# Patient Record
Sex: Male | Born: 1935 | Race: White | Hispanic: No | Smoking: Former smoker
Health system: Southern US, Community
[De-identification: ages and names within clinical notes are randomized; demographics above are authoritative.]

## PROBLEM LIST (undated history)

## (undated) DIAGNOSIS — M1000 Idiopathic gout, unspecified site: Secondary | ICD-10-CM

## (undated) DIAGNOSIS — E785 Hyperlipidemia, unspecified: Secondary | ICD-10-CM

## (undated) DIAGNOSIS — I739 Peripheral vascular disease, unspecified: Secondary | ICD-10-CM

## (undated) DIAGNOSIS — H9190 Unspecified hearing loss, unspecified ear: Secondary | ICD-10-CM

## (undated) DIAGNOSIS — E1149 Type 2 diabetes mellitus with other diabetic neurological complication: Secondary | ICD-10-CM

## (undated) DIAGNOSIS — N401 Enlarged prostate with lower urinary tract symptoms: Secondary | ICD-10-CM

## (undated) DIAGNOSIS — IMO0001 Reserved for inherently not codable concepts without codable children: Secondary | ICD-10-CM

## (undated) DIAGNOSIS — I10 Essential (primary) hypertension: Secondary | ICD-10-CM

## (undated) DIAGNOSIS — N138 Other obstructive and reflux uropathy: Secondary | ICD-10-CM

## (undated) HISTORY — DX: Hyperlipidemia, unspecified: E78.5

## (undated) HISTORY — DX: Peripheral vascular disease, unspecified: I73.9

## (undated) HISTORY — PX: PACEMAKER INSERTION: SHX728

## (undated) HISTORY — DX: Unspecified hearing loss, unspecified ear: H91.90

## (undated) HISTORY — DX: Type 2 diabetes mellitus with other diabetic neurological complication: E11.49

## (undated) HISTORY — DX: Essential (primary) hypertension: I10

## (undated) HISTORY — DX: Benign prostatic hyperplasia with lower urinary tract symptoms: N40.1

## (undated) HISTORY — DX: Benign prostatic hyperplasia with lower urinary tract symptoms: N13.8

## (undated) HISTORY — PX: HERNIA REPAIR: SHX51

## (undated) HISTORY — DX: Idiopathic gout, unspecified site: M10.00

## (undated) HISTORY — DX: Reserved for inherently not codable concepts without codable children: IMO0001

---

## 2009-06-02 ENCOUNTER — Inpatient Hospital Stay: Payer: Self-pay | Admitting: General Surgery

## 2009-06-14 ENCOUNTER — Ambulatory Visit: Payer: Self-pay | Admitting: Ophthalmology

## 2009-06-27 ENCOUNTER — Ambulatory Visit: Payer: Self-pay | Admitting: Ophthalmology

## 2010-04-05 ENCOUNTER — Emergency Department: Payer: Self-pay | Admitting: Emergency Medicine

## 2010-08-01 ENCOUNTER — Ambulatory Visit: Payer: Self-pay | Admitting: Ophthalmology

## 2010-08-14 ENCOUNTER — Ambulatory Visit: Payer: Self-pay | Admitting: Ophthalmology

## 2011-01-11 ENCOUNTER — Inpatient Hospital Stay: Payer: Self-pay | Admitting: Internal Medicine

## 2011-05-21 ENCOUNTER — Encounter: Payer: Self-pay | Admitting: Nurse Practitioner

## 2011-05-21 ENCOUNTER — Encounter: Payer: Self-pay | Admitting: Cardiothoracic Surgery

## 2013-04-01 ENCOUNTER — Ambulatory Visit: Payer: Self-pay | Admitting: Internal Medicine

## 2013-04-08 ENCOUNTER — Ambulatory Visit: Payer: Self-pay | Admitting: Internal Medicine

## 2014-12-31 NOTE — Op Note (Signed)
PATIENT NAME:  Jeffery Horne, Jeffery Horne MR#:  [id number] DATE OF BIRTH:  May 09, 1927  DATE OF PROCEDURE:  04/08/2013  ATTENDING PHYSICIAN: Dr. 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   PROCEDURE:  Insertion of a permanent pacemaker.   ACCESSORY CLINICAL DATA: Pacemaker model Adapta [id number], serial # [id number]. Right ventricular lead model #5092, serial #[id number], 58 cm. Atrial lead #5592, 53 cm, serial #[id number]. Stimulation threshold 0.7 mA on the ventricular side and 1.9 mA on the atrial side.  , 580 ohms on the ventricular side. 406 ohms on the atrial side. R-wave 11.0, P-wave 4.8.  PROCEDURE: The patient was taken to the operating room and the left upper chest was prepared with Hibiclens. It was draped in a sterile fashion. Next, 1% Xylocaine was infiltrated into the skin and subcutaneous tissue. Then an incision was made on the skin overlying the deltopectoral groove. The incision was carried down to the fascia and then subcutaneous tissues were dissected bluntly and cephalic vein was isolated. A cutdown was made in the cephalic vein and the ventricular lead was introduced through the cutdown. It was manipulated to the superior vena cava into the right atrium and then an optimum position was obtained in the floor of the right ventricle where stimulation thresholds were found to be satisfactory. The lead was tied to the cephalic vein and then on the top of the suture sleeve.  Stimulation thresholds were checked twice to make sure the lead is securely positioned. The patient had a run of V. tach during the procedure which resolved after pulling the lead back. The atrial lead was introduced from the left subclavian stick and the left subclavian vein was entered in a Trendelenburg position in the first  pass and then through the introducer kit  atrial  lead  was introduced  into the left subclavian vein and a pacemaker wire was put in the right atrium and then an optimum position was obtained in the right atrial appendage. The lead was tied to the pectoralis major muscle on top of the suture sleeves and the ventricular lead was also tied to the pectoralis major muscle on  the top of the suture sleeve and both leads were connected to the pacemaker and making sure the atrial lead goes into the atrial channel and the ventricular lead goes into the ventricular channels. All connections were tightened securely according to company specification. Subcutaneous tissues and skin were sutured separately. The patient tolerated the procedure well and he was sent to the recovery room in stable condition.    ____________________________ Cletis Athens, MD jm:dp D: 04/08/2013 15:38:54 ET T: 04/08/2013 16:38:08 ET JOB#: [id number]  cc:    Cletis Athens MD ELECTRONICALLY SIGNED 04/09/2013 18:09

## 2015-05-09 DIAGNOSIS — E1149 Type 2 diabetes mellitus with other diabetic neurological complication: Secondary | ICD-10-CM | POA: Insufficient documentation

## 2015-05-09 DIAGNOSIS — I152 Hypertension secondary to endocrine disorders: Secondary | ICD-10-CM | POA: Insufficient documentation

## 2015-05-09 DIAGNOSIS — E1169 Type 2 diabetes mellitus with other specified complication: Secondary | ICD-10-CM | POA: Insufficient documentation

## 2015-05-09 DIAGNOSIS — I10 Essential (primary) hypertension: Secondary | ICD-10-CM | POA: Insufficient documentation

## 2015-05-09 DIAGNOSIS — E785 Hyperlipidemia, unspecified: Secondary | ICD-10-CM | POA: Insufficient documentation

## 2015-05-11 ENCOUNTER — Ambulatory Visit (INDEPENDENT_AMBULATORY_CARE_PROVIDER_SITE_OTHER): Payer: Medicare Other | Admitting: Family Medicine

## 2015-05-11 ENCOUNTER — Encounter: Payer: Self-pay | Admitting: Family Medicine

## 2015-05-11 VITALS — BP 130/70 | HR 89 | Temp 97.3°F | Ht 68.0 in | Wt 208.0 lb

## 2015-05-11 DIAGNOSIS — R490 Dysphonia: Secondary | ICD-10-CM | POA: Diagnosis not present

## 2015-05-11 DIAGNOSIS — E785 Hyperlipidemia, unspecified: Secondary | ICD-10-CM | POA: Diagnosis not present

## 2015-05-11 DIAGNOSIS — E119 Type 2 diabetes mellitus without complications: Secondary | ICD-10-CM | POA: Diagnosis not present

## 2015-05-11 DIAGNOSIS — E1140 Type 2 diabetes mellitus with diabetic neuropathy, unspecified: Secondary | ICD-10-CM

## 2015-05-11 DIAGNOSIS — E1149 Type 2 diabetes mellitus with other diabetic neurological complication: Secondary | ICD-10-CM

## 2015-05-11 DIAGNOSIS — I10 Essential (primary) hypertension: Secondary | ICD-10-CM

## 2015-05-11 DIAGNOSIS — M129 Arthropathy, unspecified: Secondary | ICD-10-CM | POA: Diagnosis not present

## 2015-05-11 DIAGNOSIS — M19072 Primary osteoarthritis, left ankle and foot: Secondary | ICD-10-CM | POA: Insufficient documentation

## 2015-05-11 LAB — MICROALBUMIN, URINE WAIVED
Creatinine, Urine Waived: 100 mg/dL (ref 10–300)
MICROALB, UR WAIVED: 30 mg/L — AB (ref 0–19)
Microalb/Creat Ratio: <30 mg/g (ref <30)

## 2015-05-11 LAB — BAYER DCA HB A1C WAIVED: HB A1C (BAYER DCA - WAIVED): 7.2 % — ABNORMAL HIGH (ref <7.0)

## 2015-05-11 MED ORDER — TAMSULOSIN HCL 0.4 MG PO CAPS: 0.4000 mg | ORAL_CAPSULE | Freq: Every day | ORAL | Status: DC

## 2015-05-11 MED ORDER — GABAPENTIN 300 MG PO CAPS: 2400.0000 mg | ORAL_CAPSULE | Freq: Every day | ORAL | Status: DC

## 2015-05-11 MED ORDER — GLIPIZIDE ER 2.5 MG PO TB24: 2.5000 mg | ORAL_TABLET | Freq: Every day | ORAL | Status: DC

## 2015-05-11 MED ORDER — ENALAPRIL MALEATE 5 MG PO TABS: 5.0000 mg | ORAL_TABLET | Freq: Every day | ORAL | Status: DC

## 2015-05-11 MED ORDER — SIMVASTATIN 20 MG PO TABS: 20.0000 mg | ORAL_TABLET | Freq: Every day | ORAL | Status: DC

## 2015-05-11 MED ORDER — METOPROLOL TARTRATE 25 MG PO TABS: 25.0000 mg | ORAL_TABLET | Freq: Every day | ORAL | Status: DC

## 2015-05-11 NOTE — Assessment & Plan Note (Signed)
Discussed care support proper shoes low-dose medication such as Advil and Aleve Tylenol

## 2015-05-11 NOTE — Assessment & Plan Note (Signed)
The current medical regimen is effective;  continue present plan and medications.  

## 2015-05-11 NOTE — Assessment & Plan Note (Signed)
The current medical regimen is effective;  continue present plan and medications. Encourage proper diet exercise

## 2015-05-11 NOTE — Progress Notes (Signed)
BP 130/70 mmHg  Pulse 89  Temp(Src) 97.3 F (36.3 C)  Ht 5\' 8"  (1.727 m)  Wt 208 lb (94.348 kg)  BMI 31.63 kg/m2  SpO2 96%   Subjective:    Patient ID: Jeffery Horne, male    DOB: [birth year], 81 y.o.   MRN: [id number]  HPI: Jeffery Horne is a 81 y.o. male  Chief Complaint  Patient presents with  . Diabetes  . Medication Refill    gabapentin   Patient's diabetes doing well noted low blood sugar spells no complaints from medications.  Patient's biggest concern today is chronic hoarseness that's really been ongoing since had pneumonia 3 years ago still little hoarse clearing but some difficulty projecting his voice and hoarseness seems to be getting worse.   Blood pressure doing well cholesterol doing well with no symptoms BPH symptoms stable   Blood pressure doing well with good control   For the last year or so patient's left ankle has been bothering him worse with some walking no noted swelling no redness sometimes bothers him at rest also. Gabapentin has not helped maybe made it a little worse. Patient has old trauma to his left knee from an accident and has altered his gait some ever since and walks with a cane. Patient has not tried arthritis medicine such as Advil or Aleve or Tylenol. Patient has used equate rub on cream and has helped a little bit.  Relevant past medical, surgical, family and social history reviewed and updated as indicated. Interim medical history since our last visit reviewed. Allergies and medications reviewed and updated.  Review of Systems  Constitutional: Negative.   Respiratory: Negative.   Cardiovascular: Negative.     Per HPI unless specifically indicated above     Objective:    BP 130/70 mmHg  Pulse 89  Temp(Src) 97.3 F (36.3 C)  Ht 5\' 8"  (1.727 m)  Wt 208 lb (94.348 kg)  BMI 31.63 kg/m2  SpO2 96%  Wt Readings from Last 3 Encounters:  05/11/15 208 lb (94.348 kg)  02/08/15 207 lb (93.895 kg)    Physical Exam   Constitutional: He is oriented to person, place, and time. He appears well-developed and well-nourished. No distress.  HENT:  Head: Normocephalic and atraumatic.  Right Ear: Hearing normal.  Left Ear: Hearing normal.  Nose: Nose normal.  Eyes: Conjunctivae and lids are normal. Right eye exhibits no discharge. Left eye exhibits no discharge. No scleral icterus.  Cardiovascular: Normal rate, regular rhythm and normal heart sounds.   Pulmonary/Chest: Effort normal and breath sounds normal. No respiratory distress.  Musculoskeletal: Normal range of motion.  Left ankle exam normal with no pain on palpation no swelling for range of motion no joint laxity  Neurological: He is alert and oriented to person, place, and time.  Skin: Skin is intact. No rash noted.  Psychiatric: He has a normal mood and affect. His speech is normal and behavior is normal. Judgment and thought content normal. Cognition and memory are normal.    No results found for this or any previous visit.    Assessment & Plan:   Problem List Items Addressed This Visit      Cardiovascular and Mediastinum   Hypertension    The current medical regimen is effective;  continue present plan and medications. Encourage proper diet exercise      Relevant Medications   aspirin EC 81 MG tablet   simvastatin (ZOCOR) 20 MG tablet   metoprolol tartrate (LOPRESSOR) 25 MG  tablet   enalapril (VASOTEC) 5 MG tablet     Endocrine   Type II diabetes mellitus with neurological manifestations    The current medical regimen is effective;  continue present plan and medications.       Relevant Medications   aspirin EC 81 MG tablet   simvastatin (ZOCOR) 20 MG tablet   glipiZIDE (GLUCOTROL XL) 2.5 MG 24 hr tablet   enalapril (VASOTEC) 5 MG tablet     Musculoskeletal and Integument   Arthritis of ankle, left    Discussed care support proper shoes low-dose medication such as Advil and Aleve Tylenol      Relevant Medications    gabapentin (NEURONTIN) 300 MG capsule     Other   Hyperlipidemia    The current medical regimen is effective;  continue present plan and medications.       Relevant Medications   aspirin EC 81 MG tablet   simvastatin (ZOCOR) 20 MG tablet   metoprolol tartrate (LOPRESSOR) 25 MG tablet   enalapril (VASOTEC) 5 MG tablet   Chronic hoarseness    With chronic hoarseness getting worse will refer to Dr. Elenore Rota at [facility] to further evaluate patient's symptoms      Relevant Orders   Ambulatory referral to ENT    Other Visit Diagnoses    Diabetes mellitus without complication    -  Primary    Relevant Medications    aspirin EC 81 MG tablet    simvastatin (ZOCOR) 20 MG tablet    glipiZIDE (GLUCOTROL XL) 2.5 MG 24 hr tablet    enalapril (VASOTEC) 5 MG tablet    Other Relevant Orders    Bayer DCA Hb A1c Waived    Microalbumin, Urine Waived    Bayer DCA Hb A1c Waived        Follow up plan: Return in about 3 months (around 08/10/2015), or if symptoms worsen or fail to improve, for Physical Exam.

## 2015-05-11 NOTE — Assessment & Plan Note (Signed)
With chronic hoarseness getting worse will refer to Dr. Elenore Rota at [facility] to further evaluate patient's symptoms

## 2015-05-25 ENCOUNTER — Encounter: Payer: Self-pay | Admitting: Family Medicine

## 2015-05-25 ENCOUNTER — Ambulatory Visit (INDEPENDENT_AMBULATORY_CARE_PROVIDER_SITE_OTHER): Payer: Medicare Other | Admitting: Family Medicine

## 2015-05-25 ENCOUNTER — Telehealth: Payer: Self-pay

## 2015-05-25 VITALS — BP 120/68 | HR 87 | Temp 98.1°F | Ht 68.5 in | Wt 209.0 lb

## 2015-05-25 DIAGNOSIS — E1140 Type 2 diabetes mellitus with diabetic neuropathy, unspecified: Secondary | ICD-10-CM | POA: Diagnosis not present

## 2015-05-25 DIAGNOSIS — W19XXXD Unspecified fall, subsequent encounter: Secondary | ICD-10-CM | POA: Diagnosis not present

## 2015-05-25 DIAGNOSIS — S2241XD Multiple fractures of ribs, right side, subsequent encounter for fracture with routine healing: Secondary | ICD-10-CM

## 2015-05-25 DIAGNOSIS — R269 Unspecified abnormalities of gait and mobility: Secondary | ICD-10-CM

## 2015-05-25 DIAGNOSIS — E1149 Type 2 diabetes mellitus with other diabetic neurological complication: Secondary | ICD-10-CM

## 2015-05-25 DIAGNOSIS — Y92009 Unspecified place in unspecified non-institutional (private) residence as the place of occurrence of the external cause: Secondary | ICD-10-CM | POA: Insufficient documentation

## 2015-05-25 DIAGNOSIS — W19XXXA Unspecified fall, initial encounter: Secondary | ICD-10-CM | POA: Insufficient documentation

## 2015-05-25 DIAGNOSIS — S2241XA Multiple fractures of ribs, right side, initial encounter for closed fracture: Secondary | ICD-10-CM | POA: Insufficient documentation

## 2015-05-25 NOTE — Assessment & Plan Note (Signed)
Foot exam today; I did not appreciate significant neuropathy to suggest reason for gait imbalance or loss of proprioception because of nerve damage; will stop the glipizide

## 2015-05-25 NOTE — Telephone Encounter (Signed)
Patient's son called and wanted to let Dr. Sherie Don and I know that he thinks his father has not been honest with Dr. Dossie Arbour. Patient's son states his father has stopped breathing during his sleep several times and will stop anywhere between 30 seconds to a minute. Son also stated that dads arms "flale" around during sleep. States he fell about a week ago and went to [facility] ER. States his father has bad knees and has fallen several times in the last few years. Patient's son asked for [place] not to tell the patient that he called and when come in for his visit today but just wanted to let us know what was going on with his father.

## 2015-05-25 NOTE — Assessment & Plan Note (Signed)
Resulting in two rib fractures; refer to physical therapy; eliminate throw rugs, pet beds, electric cords on the floor, other things that might cause patient to trip up

## 2015-05-25 NOTE — Patient Instructions (Addendum)
Continue to use incentive spirometry as directed, 10x an hour while awake would be ideal We'll have you work with the physical therapist to help with strengthening and gait and balance to help prevent falls Try to get 1000 to 1200 mg of calcium daily (three servings) in your food and drinks (broccoli, kale, spinach, almond milk, tofu, bok choy, cheese, etc.) Limit risk of falls by removing throw rugs, electrical cords, etc. from the floor STOP your glipizide (diabetes medicine) Return to see Dr. Dossie Arbour in about 4 weeks for follow-up

## 2015-05-25 NOTE — Assessment & Plan Note (Addendum)
With falls; refer to physical therapy; continue to use cane

## 2015-05-25 NOTE — Progress Notes (Signed)
BP 120/68 mmHg  Pulse 87  Temp(Src) 98.1 F (36.7 C)  Ht 5' 8.5" (1.74 m)  Wt 209 lb (94.802 kg)  BMI 31.31 kg/m2  SpO2 94%   Subjective:    Patient ID: Jeffery Horne, male    DOB: [birth year], 81 y.o.   MRN: [id number]  HPI: Jeffery Horne is a 81 y.o. male  Chief Complaint  Patient presents with  . Hospitalization Follow-up    pt states he went to [facility] for having a fall and 05/19/15   He was in the carport and reaching to get a push broom and shoe or something gave way and he got off balance and came down on the back; no loss of consciousness ; did not hit head; the checked him all over; he stayed the 8th for a while until they saw the doctors at [facility]; Thursday night and Friday morning; he broke 8th and 9th ribs; nondisplaced fractures; he got home Friday afternoon  No trouble breathing; using the incentive spirometer often; 10x a day  No pain when just sitting; just has some pain when pulling himself up; not terrible  This is the only time he has fallen he says when I asked him directly about falls in the last year; he has fallen seven times over the last several years, mostly by tripping in the yard; caught foot in the grass; no LOC  He has a lift chair at home; uses a four-prong cane regularly; he also has a walker  Has a pacemaker; sees Dr. Harl Bowie  He has diabetes; last A1C reviewed, 7.2 in August; he uses a sulfonylurea  No family hx of osteoporosis or fractures to his knowledge  He used to smoke, quit in 1973  Relevant past medical, surgical, family and social history reviewed and updated as indicated. Interim medical history since our last visit reviewed. Allergies and medications reviewed and updated.  Review of Systems Per HPI unless specifically indicated above     Objective:    BP 120/68 mmHg  Pulse 87  Temp(Src) 98.1 F (36.7 C)  Ht 5' 8.5" (1.74 m)  Wt 209 lb (94.802 kg)  BMI 31.31 kg/m2  SpO2 94%  Wt Readings from Last 3  Encounters:  05/25/15 209 lb (94.802 kg)  05/11/15 208 lb (94.348 kg)  02/08/15 207 lb (93.895 kg)    Physical Exam  Constitutional: He appears well-developed and well-nourished. No distress.  Obese, elderly  HENT:  Head: Normocephalic and atraumatic.  Eyes: EOM are normal. No scleral icterus.  Neck: No thyromegaly present.  Cardiovascular: Normal rate and regular rhythm.   Pulmonary/Chest: Effort normal and breath sounds normal. He exhibits tenderness (very minimal tenderness posterolateral ribs; no bruising). He exhibits no crepitus, no deformity and no swelling.  Pacemaker palpable through upper left chest wall  Abdominal: Soft. Bowel sounds are normal. He exhibits no distension (obese).  Musculoskeletal: He exhibits no edema.  Mild thoracic kyphosis  Neurological: He displays no tremor. He exhibits abnormal muscle tone. Coordination normal.  Lower extremity weakness; needed to use arms to push up out of the chair from seated to standing position; used four-prong cane with ambulating; gait was slow and deliberate; no leaning; no outright ataxia; no foot drop  Skin: Skin is warm and dry. No bruising noted. No pallor.  Scattered brown keratotic lesions thorax, but no bruising  Psychiatric: He has a normal mood and affect. His behavior is normal. Judgment and thought content normal.  Very pleasant, euthymic,  good eye contact with examiner   Diabetic Foot Form - Detailed   Diabetic Foot Exam - detailed  Diabetic Foot exam was performed with the following findings:  Yes 05/25/2015  9:36 AM  Visual Foot Exam completed.:  Yes  Are the toenails long?:  No  Are the toenails thick?:  No  Are the toenails ingrown?:  No    Pulse Foot Exam completed.:  Yes  Right Dorsalis Pedis:  Present Left Dorsalis Pedis:  Present  Sensory Foot Exam Completed.:  Yes  Swelling:  No  Semmes-Weinstein Monofilament Test  R Site 1-Great Toe:  Pos L Site 1-Great Toe:  Pos  R Site 4:  Pos L Site 4:  Pos  R  Site 5:  Pos L Site 5:  Pos       Results for orders placed or performed in visit on 05/11/15  Bayer DCA Hb A1c Waived  Result Value Ref Range   Bayer DCA Hb A1c Waived 7.2 (H) <7.0 %  Microalbumin, Urine Waived  Result Value Ref Range   Microalb, Ur Waived 30 (H) 0 - 19 mg/L   Creatinine, Urine Waived 100 10 - 300 mg/dL   Microalb/Creat Ratio <30 <30 mg/g      Assessment & Plan:   Problem List Items Addressed This Visit      Endocrine   Type II diabetes mellitus with neurological manifestations    Foot exam today; I did not appreciate significant neuropathy to suggest reason for gait imbalance or loss of proprioception because of nerve damage; will stop the glipizide        Musculoskeletal and Integument   Multiple fractures of ribs of right side - Primary    No complications at this time; continue incentive spirometry; reduce risk of falls; refer to PT; calcium intake discussed; primary provider to see him back in 4 weeks; he may consider DEXA scan if not recently done        Other   Fall at home    Resulting in two rib fractures; refer to physical therapy; eliminate throw rugs, pet beds, electric cords on the floor, other things that might cause patient to trip up      Relevant Orders   Ambulatory referral to Physical Therapy   Gait abnormality    With falls; refer to physical therapy; continue to use cane      Relevant Orders   Ambulatory referral to Physical Therapy      Follow up plan: Return in about 4 weeks (around 06/22/2015) for with Dr. Dossie Arbour, falls, rib fractures.  An after-visit summary was printed and given to the patient at check-out.  Please see the patient instructions which may contain other information and recommendations beyond what is mentioned above in the assessment and plan.  Medications Discontinued During This Encounter  Medication Reason  . fexofenadine (ALLEGRA) 180 MG tablet Patient has not taken in last 30 days  . glipiZIDE  (GLUCOTROL XL) 2.5 MG 24 hr tablet Discontinued by provider   Orders Placed This Encounter  Procedures  . Ambulatory referral to Physical Therapy

## 2015-05-25 NOTE — Assessment & Plan Note (Addendum)
No complications at this time; continue incentive spirometry; reduce risk of falls; refer to PT; calcium intake discussed; primary provider to see him back in 4 weeks; he may consider DEXA scan if not recently done

## 2015-07-07 ENCOUNTER — Ambulatory Visit: Payer: Medicare Other | Admitting: Family Medicine

## 2015-07-28 ENCOUNTER — Ambulatory Visit (INDEPENDENT_AMBULATORY_CARE_PROVIDER_SITE_OTHER): Payer: Medicare Other | Admitting: Family Medicine

## 2015-07-28 ENCOUNTER — Encounter: Payer: Self-pay | Admitting: Family Medicine

## 2015-07-28 VITALS — BP 138/79 | HR 66 | Temp 97.7°F | Ht 67.5 in | Wt 204.0 lb

## 2015-07-28 DIAGNOSIS — H9190 Unspecified hearing loss, unspecified ear: Secondary | ICD-10-CM | POA: Insufficient documentation

## 2015-07-28 DIAGNOSIS — Z0000 Encounter for general adult medical examination without abnormal findings: Secondary | ICD-10-CM

## 2015-07-28 DIAGNOSIS — M129 Arthropathy, unspecified: Secondary | ICD-10-CM

## 2015-07-28 DIAGNOSIS — E1149 Type 2 diabetes mellitus with other diabetic neurological complication: Secondary | ICD-10-CM | POA: Diagnosis not present

## 2015-07-28 DIAGNOSIS — I495 Sick sinus syndrome: Secondary | ICD-10-CM | POA: Insufficient documentation

## 2015-07-28 DIAGNOSIS — N401 Enlarged prostate with lower urinary tract symptoms: Secondary | ICD-10-CM

## 2015-07-28 DIAGNOSIS — N138 Other obstructive and reflux uropathy: Secondary | ICD-10-CM | POA: Insufficient documentation

## 2015-07-28 DIAGNOSIS — IMO0001 Reserved for inherently not codable concepts without codable children: Secondary | ICD-10-CM | POA: Insufficient documentation

## 2015-07-28 DIAGNOSIS — E785 Hyperlipidemia, unspecified: Secondary | ICD-10-CM | POA: Diagnosis not present

## 2015-07-28 DIAGNOSIS — H9193 Unspecified hearing loss, bilateral: Secondary | ICD-10-CM

## 2015-07-28 DIAGNOSIS — I10 Essential (primary) hypertension: Secondary | ICD-10-CM

## 2015-07-28 DIAGNOSIS — M19072 Primary osteoarthritis, left ankle and foot: Secondary | ICD-10-CM

## 2015-07-28 LAB — BAYER DCA HB A1C WAIVED: HB A1C: 6.4 % (ref <7.0)

## 2015-07-28 LAB — CBC WITH DIFFERENTIAL/PLATELET
BASOS: 1 %
Basophils Absolute: 0 10*3/uL (ref 0.0–0.2)
EOS (ABSOLUTE): 0.4 10*3/uL (ref 0.0–0.4)
EOS: 7 %
HEMATOCRIT: 36.7 % — AB (ref 37.5–51.0)
HEMOGLOBIN: 12.6 g/dL (ref 12.6–17.7)
Immature Grans (Abs): 0 10*3/uL (ref 0.0–0.1)
Immature Granulocytes: 0 %
LYMPHS ABS: 1.6 10*3/uL (ref 0.7–3.1)
Lymphs: 31 %
MCH: 30.5 pg (ref 26.6–33.0)
MCHC: 34.3 g/dL (ref 31.5–35.7)
MCV: 89 fL (ref 79–97)
MONOCYTES: 8 %
MONOS ABS: 0.4 10*3/uL (ref 0.1–0.9)
Neutrophils Absolute: 2.8 10*3/uL (ref 1.4–7.0)
Neutrophils: 53 %
Platelets: 194 10*3/uL (ref 150–379)
RBC: 4.13 x10E6/uL — AB (ref 4.14–5.80)
RDW: 13.1 % (ref 12.3–15.4)
WBC: 5.3 10*3/uL (ref 3.4–10.8)

## 2015-07-28 LAB — MICROSCOPIC EXAMINATION

## 2015-07-28 LAB — URINALYSIS, ROUTINE W REFLEX MICROSCOPIC
BILIRUBIN UA: NEGATIVE
Glucose, UA: NEGATIVE
Ketones, UA: NEGATIVE
Leukocytes, UA: NEGATIVE
Nitrite, UA: NEGATIVE
PH UA: 5.5 (ref 5.0–7.5)
PROTEIN UA: NEGATIVE
Specific Gravity, UA: 1.02 (ref 1.005–1.030)
UUROB: 2 mg/dL — AB (ref 0.2–1.0)

## 2015-07-28 MED ORDER — ENALAPRIL MALEATE 5 MG PO TABS: 5.0000 mg | ORAL_TABLET | Freq: Every day | ORAL | Status: DC

## 2015-07-28 MED ORDER — GLIPIZIDE XL 2.5 MG PO TB24: 2.5000 mg | ORAL_TABLET | Freq: Every day | ORAL | Status: DC

## 2015-07-28 MED ORDER — GABAPENTIN 300 MG PO CAPS
2400.0000 mg | ORAL_CAPSULE | Freq: Every day | ORAL | Status: DC
Start: 2015-07-28 — End: 2016-07-27

## 2015-07-28 MED ORDER — TAMSULOSIN HCL 0.4 MG PO CAPS: 0.4000 mg | ORAL_CAPSULE | Freq: Every day | ORAL | Status: DC

## 2015-07-28 MED ORDER — FLUTICASONE PROPIONATE 50 MCG/ACT NA SUSP: 2.0000 | Freq: Every day | NASAL | Status: DC

## 2015-07-28 MED ORDER — METOPROLOL TARTRATE 25 MG PO TABS: 25.0000 mg | ORAL_TABLET | Freq: Every day | ORAL | Status: DC

## 2015-07-28 MED ORDER — SIMVASTATIN 20 MG PO TABS: 20.0000 mg | ORAL_TABLET | Freq: Every day | ORAL | Status: DC

## 2015-07-28 NOTE — Assessment & Plan Note (Signed)
Hearing aids 

## 2015-07-28 NOTE — Assessment & Plan Note (Signed)
Has pacemaker  

## 2015-07-28 NOTE — Assessment & Plan Note (Signed)
The current medical regimen is effective;  continue present plan and medications.  

## 2015-07-28 NOTE — Progress Notes (Signed)
BP 138/79 mmHg  Pulse 66  Temp(Src) 97.7 F (36.5 C)  Ht 5' 7.5" (1.715 m)  Wt 204 lb (92.534 kg)  BMI 31.46 kg/m2  SpO2 95%   Subjective:    Patient ID: Jeffery Horne, male    DOB: 1927/05/03, 81 y.o.   MRN: [id number]  HPI: Jeffery Horne is a 81 y.o. male  Chief Complaint  Patient presents with  . Annual Exam  AWV metrics met  Patient doing well taking diabetes medications without problems noted low blood sugar spells no issues  Blood pressure doing well with good control  Pacemaker battery check over 9 years left on battery  Cholesterol doing well Rib fractures have healed well Continuing gait abnormality from old knee injury with arthritis in his ankle  Relevant past medical, surgical, family and social history reviewed and updated as indicated. Interim medical history since our last visit reviewed. Allergies and medications reviewed and updated.  Review of Systems  Constitutional: Negative.   HENT: Negative.   Eyes: Negative.   Respiratory: Negative.   Cardiovascular: Negative.   Gastrointestinal: Negative.   Endocrine: Negative.   Genitourinary: Negative.   Musculoskeletal: Negative.   Skin: Negative.   Allergic/Immunologic: Negative.   Neurological: Negative.   Hematological: Negative.   Psychiatric/Behavioral: Negative.     Per HPI unless specifically indicated above     Objective:    BP 138/79 mmHg  Pulse 66  Temp(Src) 97.7 F (36.5 C)  Ht 5' 7.5" (1.715 m)  Wt 204 lb (92.534 kg)  BMI 31.46 kg/m2  SpO2 95%  Wt Readings from Last 3 Encounters:  07/28/15 204 lb (92.534 kg)  05/25/15 209 lb (94.802 kg)  05/11/15 208 lb (94.348 kg)    Physical Exam  Constitutional: He is oriented to person, place, and time. He appears well-developed and well-nourished.  HENT:  Head: Normocephalic and atraumatic.  Right Ear: External ear normal.  Left Ear: External ear normal.  Eyes: Conjunctivae and EOM are normal. Pupils are equal, round, and  reactive to light.  Neck: Normal range of motion. Neck supple.  Cardiovascular: Normal rate, regular rhythm, normal heart sounds and intact distal pulses.   Pulmonary/Chest: Effort normal and breath sounds normal.  Abdominal: Soft. Bowel sounds are normal. There is no splenomegaly or hepatomegaly.  Genitourinary: Rectum normal and penis normal.  Prostate enlarged  Musculoskeletal: Normal range of motion.  Neurological: He is alert and oriented to person, place, and time. He has normal reflexes.  Skin: No rash noted. No erythema.  Multiple AK on face scalp  Psychiatric: He has a normal mood and affect. His behavior is normal. Judgment and thought content normal.    Results for orders placed or performed in visit on 05/11/15  Bayer DCA Hb A1c Waived  Result Value Ref Range   Bayer DCA Hb A1c Waived 7.2 (H) <7.0 %  Microalbumin, Urine Waived  Result Value Ref Range   Microalb, Ur Waived 30 (H) 0 - 19 mg/L   Creatinine, Urine Waived 100 10 - 300 mg/dL   Microalb/Creat Ratio <30 <30 mg/g      Assessment & Plan:   Problem List Items Addressed This Visit      Cardiovascular and Mediastinum   Sick sinus syndrome ([place]) - Primary    Has pacemaker      Relevant Medications   enalapril (VASOTEC) 5 MG tablet   metoprolol tartrate (LOPRESSOR) 25 MG tablet   simvastatin (ZOCOR) 20 MG tablet   Hypertension  The current medical regimen is effective;  continue present plan and medications.       Relevant Medications   enalapril (VASOTEC) 5 MG tablet   metoprolol tartrate (LOPRESSOR) 25 MG tablet   simvastatin (ZOCOR) 20 MG tablet   Other Relevant Orders   Comprehensive metabolic panel   Lipid panel   CBC with Differential/Platelet   Urinalysis, Routine w reflex microscopic (not at [facility])   TSH     Endocrine   Type II diabetes mellitus with neurological manifestations ([place])    The current medical regimen is effective;  continue present plan and medications.       Relevant  Medications   enalapril (VASOTEC) 5 MG tablet   simvastatin (ZOCOR) 20 MG tablet   GLIPIZIDE XL 2.5 MG 24 hr tablet   Other Relevant Orders   Comprehensive metabolic panel   Lipid panel   CBC with Differential/Platelet   Urinalysis, Routine w reflex microscopic (not at [facility])   TSH   Bayer DCA Hb A1c Waived     Nervous and Auditory   Hearing impairment    Hearing aids        Musculoskeletal and Integument   Arthritis of ankle, left   Relevant Medications   gabapentin (NEURONTIN) 300 MG capsule     Genitourinary   BPH with obstruction/lower urinary tract symptoms   Relevant Medications   tamsulosin (FLOMAX) 0.4 MG CAPS capsule     Other   Hyperlipidemia    The current medical regimen is effective;  continue present plan and medications.       Relevant Medications   enalapril (VASOTEC) 5 MG tablet   metoprolol tartrate (LOPRESSOR) 25 MG tablet   simvastatin (ZOCOR) 20 MG tablet   Other Relevant Orders   Comprehensive metabolic panel   Lipid panel   CBC with Differential/Platelet   Urinalysis, Routine w reflex microscopic (not at [facility])   TSH    Other Visit Diagnoses    PE (physical exam), annual            Follow up plan: Return in about 3 months (around 10/28/2015), or if symptoms worsen or fail to improve, for recheck a1c.

## 2015-07-29 LAB — COMPREHENSIVE METABOLIC PANEL WITH GFR
ALT: 12 IU/L (ref 0–44)
AST: 15 IU/L (ref 0–40)
Albumin/Globulin Ratio: 1.6 (ref 1.1–2.5)
Albumin: 4 g/dL (ref 3.5–4.7)
Alkaline Phosphatase: 82 IU/L (ref 39–117)
BUN/Creatinine Ratio: 17 (ref 10–22)
BUN: 15 mg/dL (ref 8–27)
Bilirubin Total: 0.9 mg/dL (ref 0.0–1.2)
CO2: 26 mmol/L (ref 18–29)
Calcium: 8.7 mg/dL (ref 8.6–10.2)
Chloride: 104 mmol/L (ref 97–106)
Creatinine, Ser: 0.86 mg/dL (ref 0.76–1.27)
GFR calc Af Amer: 91 mL/min/1.73
GFR calc non Af Amer: 79 mL/min/1.73
Globulin, Total: 2.5 g/dL (ref 1.5–4.5)
Glucose: 120 mg/dL — ABNORMAL HIGH (ref 65–99)
Potassium: 4.2 mmol/L (ref 3.5–5.2)
Sodium: 143 mmol/L (ref 136–144)
Total Protein: 6.5 g/dL (ref 6.0–8.5)

## 2015-07-29 LAB — LIPID PANEL
CHOL/HDL RATIO: 2.7 ratio (ref 0.0–5.0)
Cholesterol, Total: 118 mg/dL (ref 100–199)
HDL: 43 mg/dL (ref >39)
LDL CALC: 63 mg/dL (ref 0–99)
TRIGLYCERIDES: 59 mg/dL (ref 0–149)
VLDL CHOLESTEROL CAL: 12 mg/dL (ref 5–40)

## 2015-07-29 LAB — TSH: TSH: 1.38 u[IU]/mL (ref 0.450–4.500)

## 2015-08-01 ENCOUNTER — Encounter: Payer: Self-pay | Admitting: Family Medicine

## 2015-09-09 LAB — HM DIABETES EYE EXAM

## 2015-11-02 ENCOUNTER — Encounter: Payer: Self-pay | Admitting: Family Medicine

## 2015-11-02 ENCOUNTER — Ambulatory Visit (INDEPENDENT_AMBULATORY_CARE_PROVIDER_SITE_OTHER): Payer: Medicare Other | Admitting: Family Medicine

## 2015-11-02 VITALS — BP 131/71 | HR 71 | Temp 97.7°F | Ht 67.1 in | Wt 200.0 lb

## 2015-11-02 DIAGNOSIS — E1149 Type 2 diabetes mellitus with other diabetic neurological complication: Secondary | ICD-10-CM | POA: Diagnosis not present

## 2015-11-02 DIAGNOSIS — M129 Arthropathy, unspecified: Secondary | ICD-10-CM

## 2015-11-02 DIAGNOSIS — M19072 Primary osteoarthritis, left ankle and foot: Secondary | ICD-10-CM

## 2015-11-02 DIAGNOSIS — I10 Essential (primary) hypertension: Secondary | ICD-10-CM | POA: Diagnosis not present

## 2015-11-02 DIAGNOSIS — E119 Type 2 diabetes mellitus without complications: Secondary | ICD-10-CM

## 2015-11-02 LAB — BAYER DCA HB A1C WAIVED: HB A1C: 6.9 % (ref <7.0)

## 2015-11-02 NOTE — Assessment & Plan Note (Signed)
Discussed ankle braces and support

## 2015-11-02 NOTE — Assessment & Plan Note (Signed)
The current medical regimen is effective;  continue present plan and medications.  

## 2015-11-02 NOTE — Progress Notes (Signed)
   BP 131/71 mmHg  Pulse 71  Temp(Src) 97.7 F (36.5 C)  Ht 5' 7.1" (1.704 m)  Wt 200 lb (90.719 kg)  BMI 31.24 kg/m2  SpO2 95%   Subjective:    Patient ID: Jeffery Horne, male    DOB: 04-12-1927, 82 y.o.   MRN: [id number]  HPI: Jeffery Horne is a 82 y.o. male  Chief Complaint  Patient presents with  . Diabetes  . ankle/foot swelling    left, interested in a brace   follow-up diabetes medications doing well with no complaints No low blood sugar spells no issues with medications takes medications faithfully Has been to a podiatrist with diagnosis of left ankle arthritis as wondered about using a ankle brace which was discussed with patient Reports from cardiology on his pacemaker  Blood pressure cholesterol doing well with no complaints Relevant past medical, surgical, family and social history reviewed and updated as indicated. Interim medical history since our last visit reviewed. Allergies and medications reviewed and updated.  Review of Systems  Constitutional: Negative.   Respiratory: Negative.   Cardiovascular: Negative.     Per HPI unless specifically indicated above     Objective:    BP 131/71 mmHg  Pulse 71  Temp(Src) 97.7 F (36.5 C)  Ht 5' 7.1" (1.704 m)  Wt 200 lb (90.719 kg)  BMI 31.24 kg/m2  SpO2 95%  Wt Readings from Last 3 Encounters:  11/02/15 200 lb (90.719 kg)  07/28/15 204 lb (92.534 kg)  05/25/15 209 lb (94.802 kg)    Physical Exam  Constitutional: He is oriented to person, place, and time. He appears well-developed and well-nourished. No distress.  HENT:  Head: Normocephalic and atraumatic.  Right Ear: Hearing normal.  Left Ear: Hearing normal.  Nose: Nose normal.  Eyes: Conjunctivae and lids are normal. Right eye exhibits no discharge. Left eye exhibits no discharge. No scleral icterus.  Cardiovascular: Normal rate, regular rhythm and normal heart sounds.   Pulmonary/Chest: Effort normal and breath sounds normal. No respiratory  distress.  Musculoskeletal: Normal range of motion.  Neurological: He is alert and oriented to person, place, and time.  Skin: Skin is intact. No rash noted.  Psychiatric: He has a normal mood and affect. His speech is normal and behavior is normal. Judgment and thought content normal. Cognition and memory are normal.    Results for orders placed or performed in visit on 11/02/15  HM DIABETES EYE EXAM  Result Value Ref Range   HM Diabetic Eye Exam No Retinopathy No Retinopathy      Assessment & Plan:   Problem List Items Addressed This Visit      Cardiovascular and Mediastinum   Hypertension - Primary    The current medical regimen is effective;  continue present plan and medications.         Endocrine   Type II diabetes mellitus with neurological manifestations (HCC)    The current medical regimen is effective;  continue present plan and medications.         Musculoskeletal and Integument   Arthritis of ankle, left    Discussed ankle braces and support       Other Visit Diagnoses    Diabetes mellitus without complication (HCC)            Follow up plan: Return in about 3 months (around 01/30/2016) for Med check, lipids, ALT, AST, BMP, A1c.

## 2016-02-02 ENCOUNTER — Ambulatory Visit (INDEPENDENT_AMBULATORY_CARE_PROVIDER_SITE_OTHER): Payer: Medicare Other | Admitting: Family Medicine

## 2016-02-02 ENCOUNTER — Encounter: Payer: Self-pay | Admitting: Family Medicine

## 2016-02-02 VITALS — BP 139/75 | HR 73 | Temp 97.6°F | Ht 67.1 in | Wt 200.0 lb

## 2016-02-02 DIAGNOSIS — I10 Essential (primary) hypertension: Secondary | ICD-10-CM | POA: Diagnosis not present

## 2016-02-02 DIAGNOSIS — E1149 Type 2 diabetes mellitus with other diabetic neurological complication: Secondary | ICD-10-CM

## 2016-02-02 DIAGNOSIS — N401 Enlarged prostate with lower urinary tract symptoms: Secondary | ICD-10-CM

## 2016-02-02 DIAGNOSIS — N138 Other obstructive and reflux uropathy: Secondary | ICD-10-CM

## 2016-02-02 DIAGNOSIS — E785 Hyperlipidemia, unspecified: Secondary | ICD-10-CM

## 2016-02-02 LAB — LP+ALT+AST PICCOLO, WAIVED
ALT (SGPT) PICCOLO, WAIVED: 18 U/L (ref 10–47)
AST (SGOT) PICCOLO, WAIVED: 14 U/L (ref 11–38)
CHOL/HDL RATIO PICCOLO,WAIVE: 2.7 mg/dL
Cholesterol Piccolo, Waived: 121 mg/dL (ref <200)
HDL Chol Piccolo, Waived: 44 mg/dL — ABNORMAL LOW (ref >59)
LDL CHOL CALC PICCOLO WAIVED: 63 mg/dL (ref <100)
Triglycerides Piccolo,Waived: 72 mg/dL (ref <150)
VLDL CHOL CALC PICCOLO,WAIVE: 14 mg/dL (ref <30)

## 2016-02-02 LAB — BAYER DCA HB A1C WAIVED: HB A1C (BAYER DCA - WAIVED): 6.4 % (ref <7.0)

## 2016-02-02 NOTE — Progress Notes (Signed)
BP 139/75 mmHg  Pulse 73  Temp(Src) 97.6 F (36.4 C)  Ht 5' 7.1" (1.704 m)  Wt 200 lb (90.719 kg)  BMI 31.24 kg/m2  SpO2 96%   Subjective:    Patient ID: Jeffery Horne, male    DOB: April 29, 1927, 82 y.o.   MRN: [id number]  HPI: Jeffery Horne is a 82 y.o. male  Chief Complaint  Patient presents with  . Diabetes  . Hypertension  . Hyperlipidemia   Patient follow-up doing well diabetes no hypoglycemic spells seemingly good control no issues with medications. Blood pressure doing well no issues with medications Cholesterol doing well no issues  diabetic neuropathy stable with gabapentin and Flomax doing okay for stated issues.  Relevant past medical, surgical, family and social history reviewed and updated as indicated. Interim medical history since our last visit reviewed. Allergies and medications reviewed and updated.  Review of Systems  Constitutional: Negative.   Respiratory: Negative.   Cardiovascular: Negative.     Per HPI unless specifically indicated above     Objective:    BP 139/75 mmHg  Pulse 73  Temp(Src) 97.6 F (36.4 C)  Ht 5' 7.1" (1.704 m)  Wt 200 lb (90.719 kg)  BMI 31.24 kg/m2  SpO2 96%  Wt Readings from Last 3 Encounters:  02/02/16 200 lb (90.719 kg)  11/02/15 200 lb (90.719 kg)  07/28/15 204 lb (92.534 kg)    Physical Exam  Constitutional: He is oriented to person, place, and time. He appears well-developed and well-nourished. No distress.  HENT:  Head: Normocephalic and atraumatic.  Right Ear: Hearing normal.  Left Ear: Hearing normal.  Nose: Nose normal.  Eyes: Conjunctivae and lids are normal. Right eye exhibits no discharge. Left eye exhibits no discharge. No scleral icterus.  Cardiovascular: Normal rate, normal heart sounds and intact distal pulses.   Pulmonary/Chest: Effort normal and breath sounds normal. No respiratory distress.  Musculoskeletal: Normal range of motion.  Neurological: He is alert and oriented to person,  place, and time.  Skin: Skin is intact. No rash noted.  Psychiatric: He has a normal mood and affect. His speech is normal and behavior is normal. Judgment and thought content normal. Cognition and memory are normal.    Results for orders placed or performed in visit on 11/02/15  Bayer DCA Hb A1c Waived  Result Value Ref Range   Bayer DCA Hb A1c Waived 6.9 <7.0 %      Assessment & Plan:   Problem List Items Addressed This Visit      Cardiovascular and Mediastinum   Hypertension    The current medical regimen is effective;  continue present plan and medications.       Relevant Orders   LP+ALT+AST Piccolo, Waived   Bayer DCA Hb A1c Waived   Basic metabolic panel     Endocrine   Type II diabetes mellitus with neurological manifestations (HCC) - Primary   Relevant Orders   LP+ALT+AST Piccolo, Waived   Bayer DCA Hb A1c Waived   Basic metabolic panel     Genitourinary   BPH with obstruction/lower urinary tract symptoms    The current medical regimen is effective;  continue present plan and medications.         Other   Hyperlipidemia    The current medical regimen is effective;  continue present plan and medications.       Relevant Orders   LP+ALT+AST Piccolo, Waived   Bayer DCA Hb A1c Waived   Basic metabolic panel  Follow up plan: Return in about 3 months (around 05/04/2016) for a1c.

## 2016-02-02 NOTE — Assessment & Plan Note (Signed)
The current medical regimen is effective;  continue present plan and medications.  

## 2016-02-03 LAB — BASIC METABOLIC PANEL
BUN/Creatinine Ratio: 18 (ref 10–24)
BUN: 18 mg/dL (ref 8–27)
CO2: 23 mmol/L (ref 18–29)
CREATININE: 1 mg/dL (ref 0.76–1.27)
Calcium: 8.8 mg/dL (ref 8.6–10.2)
Chloride: 102 mmol/L (ref 96–106)
GFR calc Af Amer: 78 mL/min/{1.73_m2} (ref >59)
GFR calc non Af Amer: 67 mL/min/{1.73_m2} (ref >59)
GLUCOSE: 114 mg/dL — AB (ref 65–99)
Potassium: 4.8 mmol/L (ref 3.5–5.2)
SODIUM: 141 mmol/L (ref 134–144)

## 2016-02-07 ENCOUNTER — Encounter: Payer: Self-pay | Admitting: Family Medicine

## 2016-05-16 ENCOUNTER — Ambulatory Visit (INDEPENDENT_AMBULATORY_CARE_PROVIDER_SITE_OTHER): Payer: Medicare Other | Admitting: Family Medicine

## 2016-05-16 ENCOUNTER — Encounter: Payer: Self-pay | Admitting: Family Medicine

## 2016-05-16 VITALS — BP 118/69 | HR 65 | Temp 97.7°F | Wt 196.0 lb

## 2016-05-16 DIAGNOSIS — L989 Disorder of the skin and subcutaneous tissue, unspecified: Secondary | ICD-10-CM | POA: Diagnosis not present

## 2016-05-16 DIAGNOSIS — E785 Hyperlipidemia, unspecified: Secondary | ICD-10-CM

## 2016-05-16 DIAGNOSIS — N138 Other obstructive and reflux uropathy: Secondary | ICD-10-CM

## 2016-05-16 DIAGNOSIS — N401 Enlarged prostate with lower urinary tract symptoms: Secondary | ICD-10-CM

## 2016-05-16 DIAGNOSIS — E1149 Type 2 diabetes mellitus with other diabetic neurological complication: Secondary | ICD-10-CM

## 2016-05-16 DIAGNOSIS — I10 Essential (primary) hypertension: Secondary | ICD-10-CM

## 2016-05-16 LAB — BAYER DCA HB A1C WAIVED: HB A1C (BAYER DCA - WAIVED): 6.3 % (ref <7.0)

## 2016-05-16 MED ORDER — DOXAZOSIN MESYLATE 1 MG PO TABS: 1.0000 mg | ORAL_TABLET | Freq: Every day | ORAL | 3 refills | Status: DC

## 2016-05-16 NOTE — Assessment & Plan Note (Signed)
The current medical regimen is effective;  continue present plan and medications.  

## 2016-05-16 NOTE — Assessment & Plan Note (Signed)
Dermatology referral nonhealing skin lesion

## 2016-05-16 NOTE — Progress Notes (Signed)
BP 118/69 (BP Location: Left Arm, Patient Position: Sitting, Cuff Size: Normal)   Pulse 65   Temp 97.7 F (36.5 C)   Wt 196 lb (88.9 kg) Comment: with shoes  SpO2 95%   BMI 30.61 kg/m    Subjective:    Patient ID: Jeffery Horne, male    DOB: 09/20/1926, 82 y.o.   MRN: [id number]  HPI: Jeffery GreenlandLeonard K Almendariz is a 82 y.o. male  Chief Complaint  Patient presents with  . Diabetes   Patient follow-up diabetes doing well no complaints from medications taken faithfully no low blood sugar spells or issues. Blood pressure doing well no complaints taking medications without side effects  Restless legs and diabetic peripheral neuropathy well controlled with gabapentin  Patient concerned about nocturia even with tamsulosin and fluid restriction has to get up to 3 times at night. Wants to try another medication  Has nonhealing skin lesion left chin area also multiple actinic keratosis on scalp and face will refer to dermatology  Relevant past medical, surgical, family and social history reviewed and updated as indicated. Interim medical history since our last visit reviewed. Allergies and medications reviewed and updated.  Review of Systems  Constitutional: Negative.   Respiratory: Negative.   Cardiovascular: Negative.     Per HPI unless specifically indicated above     Objective:    BP 118/69 (BP Location: Left Arm, Patient Position: Sitting, Cuff Size: Normal)   Pulse 65   Temp 97.7 F (36.5 C)   Wt 196 lb (88.9 kg) Comment: with shoes  SpO2 95%   BMI 30.61 kg/m   Wt Readings from Last 3 Encounters:  05/16/16 196 lb (88.9 kg)  02/02/16 200 lb (90.7 kg)  11/02/15 200 lb (90.7 kg)    Physical Exam  Constitutional: He is oriented to person, place, and time. He appears well-developed and well-nourished. No distress.  HENT:  Head: Normocephalic and atraumatic.  Right Ear: Hearing normal.  Left Ear: Hearing normal.  Nose: Nose normal.  Eyes: Conjunctivae and lids are  normal. Right eye exhibits no discharge. Left eye exhibits no discharge. No scleral icterus.  Cardiovascular: Normal rate, regular rhythm and normal heart sounds.   Pulmonary/Chest: Effort normal and breath sounds normal. No respiratory distress.  Musculoskeletal: Normal range of motion.  Neurological: He is alert and oriented to person, place, and time.  Skin: Skin is intact. No rash noted.  Left chin not healing lesion Multiple actinic keratosis scalp and face  Psychiatric: He has a normal mood and affect. His speech is normal and behavior is normal. Judgment and thought content normal. Cognition and memory are normal.    Results for orders placed or performed in visit on 02/02/16  LP+ALT+AST Piccolo, Arrow ElectronicsWaived  Result Value Ref Range   ALT (SGPT) Piccolo, Waived 18 10 - 47 U/L   AST (SGOT) Piccolo, Waived 14 11 - 38 U/L   Cholesterol Piccolo, Waived 121 <200 mg/dL   HDL Chol Piccolo, Waived 44 (L) >59 mg/dL   Triglycerides Piccolo,Waived 72 <150 mg/dL   Chol/HDL Ratio Piccolo,Waive 2.7 mg/dL   LDL Chol Calc Piccolo Waived 63 <100 mg/dL   VLDL Chol Calc Piccolo,Waive 14 <30 mg/dL  Bayer DCA Hb W0JA1c Waived  Result Value Ref Range   Bayer DCA Hb A1c Waived 6.4 <7.0 %  Basic metabolic panel  Result Value Ref Range   Glucose 114 (H) 65 - 99 mg/dL   BUN 18 8 - 27 mg/dL   Creatinine, Ser 8.111.00 0.76 -  1.27 mg/dL   GFR calc non Af Amer 67 >59 mL/min/1.73   GFR calc Af Amer 78 >59 mL/min/1.73   BUN/Creatinine Ratio 18 10 - 24   Sodium 141 134 - 144 mmol/L   Potassium 4.8 3.5 - 5.2 mmol/L   Chloride 102 96 - 106 mmol/L   CO2 23 18 - 29 mmol/L   Calcium 8.8 8.6 - 10.2 mg/dL      Assessment & Plan:   Problem List Items Addressed This Visit      Cardiovascular and Mediastinum   Hypertension    The current medical regimen is effective;  continue present plan and medications.       Relevant Medications   doxazosin (CARDURA) 1 MG tablet     Endocrine   Type II diabetes mellitus  with neurological manifestations (HCC) - Primary    The current medical regimen is effective;  continue present plan and medications.       Relevant Orders   Bayer DCA Hb A1c Waived     Musculoskeletal and Integument   Skin lesion of face    Dermatology referral nonhealing skin lesion      Relevant Orders   Ambulatory referral to Dermatology     Genitourinary   BPH with obstruction/lower urinary tract symptoms    Discussed patient has family on Doxazosin and wants to try that we will stop tamsulosin. Patient education given on orthostatic hypotension        Other   Hyperlipidemia    The current medical regimen is effective;  continue present plan and medications.       Relevant Medications   doxazosin (CARDURA) 1 MG tablet    Other Visit Diagnoses   None.      Follow up plan: Return in about 3 months (around 08/15/2016) for Physical Exam, Hemoglobin A1c.

## 2016-05-16 NOTE — Assessment & Plan Note (Signed)
Discussed patient has family on Doxazosin and wants to try that we will stop tamsulosin. Patient education given on orthostatic hypotension

## 2016-06-15 ENCOUNTER — Other Ambulatory Visit: Payer: Self-pay | Admitting: Family Medicine

## 2016-06-15 DIAGNOSIS — I10 Essential (primary) hypertension: Secondary | ICD-10-CM

## 2016-06-15 MED ORDER — ENALAPRIL MALEATE 5 MG PO TABS: 5.0000 mg | ORAL_TABLET | Freq: Every day | ORAL | 4 refills | Status: DC

## 2016-06-15 NOTE — Telephone Encounter (Signed)
Pt came by is completely out of Enalapril. [facility] Pt is completely out, please send ASAP. Thanks.

## 2016-06-15 NOTE — Telephone Encounter (Signed)
Routing to provider, Dr. Dossie Arbourrissman patient.

## 2016-07-27 ENCOUNTER — Other Ambulatory Visit: Payer: Self-pay | Admitting: Family Medicine

## 2016-07-27 DIAGNOSIS — M19072 Primary osteoarthritis, left ankle and foot: Secondary | ICD-10-CM

## 2016-07-27 NOTE — Telephone Encounter (Signed)
Pt would like a refill for gabapentin (NEURONTIN) 300 MG capsule and doxazosin (CARDURA) 1 MG [facility] He stated he isn't out yet and has a 30 day supply refill but he would like [facility]

## 2016-07-30 MED ORDER — DOXAZOSIN MESYLATE 1 MG PO TABS: 1.0000 mg | ORAL_TABLET | Freq: Every day | ORAL | 1 refills | Status: DC

## 2016-07-30 MED ORDER — GABAPENTIN 300 MG PO CAPS: 2400.0000 mg | ORAL_CAPSULE | Freq: Every day | ORAL | 4 refills | Status: DC

## 2016-09-18 ENCOUNTER — Other Ambulatory Visit: Payer: Self-pay

## 2016-09-18 DIAGNOSIS — I10 Essential (primary) hypertension: Secondary | ICD-10-CM

## 2016-09-18 MED ORDER — METOPROLOL TARTRATE 25 MG PO TABS: 25.0000 mg | ORAL_TABLET | Freq: Every day | ORAL | 4 refills | Status: DC

## 2016-09-25 ENCOUNTER — Ambulatory Visit (INDEPENDENT_AMBULATORY_CARE_PROVIDER_SITE_OTHER): Payer: Medicare Other | Admitting: Family Medicine

## 2016-09-25 ENCOUNTER — Encounter: Payer: Self-pay | Admitting: Family Medicine

## 2016-09-25 VITALS — BP 128/62 | HR 87 | Temp 97.4°F | Ht 68.5 in | Wt 196.0 lb

## 2016-09-25 DIAGNOSIS — Z1329 Encounter for screening for other suspected endocrine disorder: Secondary | ICD-10-CM | POA: Diagnosis not present

## 2016-09-25 DIAGNOSIS — N401 Enlarged prostate with lower urinary tract symptoms: Secondary | ICD-10-CM | POA: Diagnosis not present

## 2016-09-25 DIAGNOSIS — M19072 Primary osteoarthritis, left ankle and foot: Secondary | ICD-10-CM | POA: Diagnosis not present

## 2016-09-25 DIAGNOSIS — I495 Sick sinus syndrome: Secondary | ICD-10-CM

## 2016-09-25 DIAGNOSIS — Z0000 Encounter for general adult medical examination without abnormal findings: Secondary | ICD-10-CM

## 2016-09-25 DIAGNOSIS — E7800 Pure hypercholesterolemia, unspecified: Secondary | ICD-10-CM | POA: Diagnosis not present

## 2016-09-25 DIAGNOSIS — R490 Dysphonia: Secondary | ICD-10-CM | POA: Diagnosis not present

## 2016-09-25 DIAGNOSIS — E1149 Type 2 diabetes mellitus with other diabetic neurological complication: Secondary | ICD-10-CM | POA: Diagnosis not present

## 2016-09-25 DIAGNOSIS — N138 Other obstructive and reflux uropathy: Secondary | ICD-10-CM | POA: Diagnosis not present

## 2016-09-25 DIAGNOSIS — E785 Hyperlipidemia, unspecified: Secondary | ICD-10-CM

## 2016-09-25 DIAGNOSIS — I10 Essential (primary) hypertension: Secondary | ICD-10-CM

## 2016-09-25 LAB — URINALYSIS, ROUTINE W REFLEX MICROSCOPIC
Bilirubin, UA: NEGATIVE
Glucose, UA: NEGATIVE
LEUKOCYTES UA: NEGATIVE
NITRITE UA: NEGATIVE
PH UA: 5.5 (ref 5.0–7.5)
Protein, UA: NEGATIVE
RBC UA: NEGATIVE
SPEC GRAV UA: 1.025 (ref 1.005–1.030)
UUROB: 0.2 mg/dL (ref 0.2–1.0)

## 2016-09-25 LAB — MICROALBUMIN, URINE WAIVED
Creatinine, Urine Waived: 200 mg/dL (ref 10–300)
Microalb, Ur Waived: 80 mg/L — ABNORMAL HIGH (ref 0–19)
Microalb/Creat Ratio: <30 mg/g (ref <30)

## 2016-09-25 LAB — BAYER DCA HB A1C WAIVED: HB A1C (BAYER DCA - WAIVED): 6.2 % (ref <7.0)

## 2016-09-25 MED ORDER — DOXAZOSIN MESYLATE 1 MG PO TABS: 1.0000 mg | ORAL_TABLET | Freq: Every day | ORAL | 4 refills | Status: DC

## 2016-09-25 MED ORDER — GLIPIZIDE XL 2.5 MG PO TB24: 2.5000 mg | ORAL_TABLET | Freq: Every day | ORAL | 4 refills | Status: DC

## 2016-09-25 MED ORDER — ENALAPRIL MALEATE 5 MG PO TABS: 5.0000 mg | ORAL_TABLET | Freq: Every day | ORAL | 4 refills | Status: DC

## 2016-09-25 MED ORDER — SIMVASTATIN 20 MG PO TABS: 20.0000 mg | ORAL_TABLET | Freq: Every day | ORAL | 4 refills | Status: DC

## 2016-09-25 MED ORDER — GABAPENTIN 300 MG PO CAPS: 2400.0000 mg | ORAL_CAPSULE | Freq: Every day | ORAL | 4 refills | Status: DC

## 2016-09-25 MED ORDER — METOPROLOL TARTRATE 25 MG PO TABS: 25.0000 mg | ORAL_TABLET | Freq: Every day | ORAL | 4 refills | Status: DC

## 2016-09-25 NOTE — Assessment & Plan Note (Signed)
With change in chronic hoarseness and some throat soreness will refer back to ear nose and throat to further evaluate.

## 2016-09-25 NOTE — Progress Notes (Signed)
BP 128/62 (BP Location: Left Arm)   Pulse 87   Temp 97.4 F (36.3 C) (Oral)   Ht 5' 8.5" (1.74 m)   Wt 196 lb (88.9 kg)   SpO2 95%   BMI 29.36 kg/m    Subjective:    Patient ID: Jeffery Horne, male    DOB: Dec 25, 1926, 84 y.o.   MRN: [id number]  HPI: Jeffery Horne is a 83 y.o. male  Chief Complaint  Patient presents with  . Annual Exam  AWV metrics met Patient also follow-up medical issues Blood pressure doing okay taking blood pressure medicines without problems 6 sinus with pacemaker stable having better recheck coming up this spring. Diabetes noted low blood sugar spells no issues with medications. BPH get several times at night but otherwise doing okay with Cardura. No issues with cholesterol medicines.  also with some several lesions on his face which were treated by dermatology yesterday Patient also with some increasing hoarseness and throat soreness which is changed from last visit to ear nose and throat where he is followed for chronic hoarseness.  Relevant past medical, surgical, family and social history reviewed and updated as indicated. Interim medical history since our last visit reviewed. Allergies and medications reviewed and updated.  Review of Systems  Constitutional: Negative.   HENT: Negative.   Eyes: Negative.   Respiratory: Negative.   Cardiovascular: Negative.   Gastrointestinal: Negative.   Endocrine: Negative.   Genitourinary: Negative.   Musculoskeletal: Negative.   Skin: Negative.   Allergic/Immunologic: Negative.   Neurological: Negative.   Hematological: Negative.   Psychiatric/Behavioral: Negative.     Per HPI unless specifically indicated above     Objective:    BP 128/62 (BP Location: Left Arm)   Pulse 87   Temp 97.4 F (36.3 C) (Oral)   Ht 5' 8.5" (1.74 m)   Wt 196 lb (88.9 kg)   SpO2 95%   BMI 29.36 kg/m   Wt Readings from Last 3 Encounters:  09/25/16 196 lb (88.9 kg)  05/16/16 196 lb (88.9 kg)  02/02/16 200 lb  (90.7 kg)    Physical Exam  Constitutional: He is oriented to person, place, and time. He appears well-developed and well-nourished.  HENT:  Head: Normocephalic and atraumatic.  Right Ear: External ear normal.  Left Ear: External ear normal.  Eyes: Conjunctivae and EOM are normal. Pupils are equal, round, and reactive to light.  Neck: Normal range of motion. Neck supple.  Cardiovascular: Normal rate, regular rhythm, normal heart sounds and intact distal pulses.   Pulmonary/Chest: Effort normal and breath sounds normal.  Abdominal: Soft. Bowel sounds are normal. There is no splenomegaly or hepatomegaly.  Genitourinary: Rectum normal, prostate normal and penis normal.  Musculoskeletal: Normal range of motion.  Neurological: He is alert and oriented to person, place, and time. He has normal reflexes.  Skin: No rash noted. No erythema.  Psychiatric: He has a normal mood and affect. His behavior is normal. Judgment and thought content normal.    Results for orders placed or performed in visit on 05/16/16  Bayer DCA Hb A1c Waived  Result Value Ref Range   Bayer DCA Hb A1c Waived 6.3 <7.0 %      Assessment & Plan:   Problem List Items Addressed This Visit      Cardiovascular and Mediastinum   Hypertension - Primary    The current medical regimen is effective;  continue present plan and medications.       Relevant Medications  doxazosin (CARDURA) 1 MG tablet   metoprolol tartrate (LOPRESSOR) 25 MG tablet   simvastatin (ZOCOR) 20 MG tablet   enalapril (VASOTEC) 5 MG tablet   Other Relevant Orders   Comp Met (CMET)   CBC w/Diff   Microalbumin, Urine Waived   Urinalysis, Routine w reflex microscopic   Sick sinus syndrome (HCC)    The current medical regimen is effective;  continue present plan and medications.       Relevant Medications   doxazosin (CARDURA) 1 MG tablet   metoprolol tartrate (LOPRESSOR) 25 MG tablet   simvastatin (ZOCOR) 20 MG tablet   enalapril (VASOTEC)  5 MG tablet     Endocrine   Type II diabetes mellitus with neurological manifestations (HCC)   Relevant Medications   GLIPIZIDE XL 2.5 MG 24 hr tablet   simvastatin (ZOCOR) 20 MG tablet   enalapril (VASOTEC) 5 MG tablet   Other Relevant Orders   Bayer DCA Hb A1c Waived (STAT)   Comp Met (CMET)   CBC w/Diff     Musculoskeletal and Integument   Arthritis of ankle, left    He uses a left ankle brace which helps      Relevant Medications   gabapentin (NEURONTIN) 300 MG capsule     Genitourinary   BPH with obstruction/lower urinary tract symptoms    The current medical regimen is effective;  continue present plan and medications.       Relevant Medications   doxazosin (CARDURA) 1 MG tablet     Other   Hyperlipidemia    The current medical regimen is effective;  continue present plan and medications.       Relevant Medications   doxazosin (CARDURA) 1 MG tablet   metoprolol tartrate (LOPRESSOR) 25 MG tablet   simvastatin (ZOCOR) 20 MG tablet   enalapril (VASOTEC) 5 MG tablet   Other Relevant Orders   CBC w/Diff   Lipid panel   Chronic hoarseness    With change in chronic hoarseness and some throat soreness will refer back to ear nose and throat to further evaluate.      Relevant Orders   Ambulatory referral to ENT    Other Visit Diagnoses    Annual physical exam       Relevant Orders   Bayer DCA Hb A1c Waived (STAT)   Comp Met (CMET)   CBC w/Diff   TSH   Microalbumin, Urine Waived   Urinalysis, Routine w reflex microscopic   Screening for thyroid disorder       Relevant Orders   TSH       Follow up plan: Return in about 3 months (around 12/24/2016) for Hemoglobin A1c.

## 2016-09-25 NOTE — Assessment & Plan Note (Signed)
The current medical regimen is effective;  continue present plan and medications.  

## 2016-09-25 NOTE — Assessment & Plan Note (Signed)
He uses a left ankle brace which helps

## 2016-09-26 LAB — CBC WITH DIFFERENTIAL/PLATELET
BASOS ABS: 0 10*3/uL (ref 0.0–0.2)
Basos: 1 %
EOS (ABSOLUTE): 0.6 10*3/uL — ABNORMAL HIGH (ref 0.0–0.4)
Eos: 9 %
HEMOGLOBIN: 12.3 g/dL — AB (ref 13.0–17.7)
Hematocrit: 36.4 % — ABNORMAL LOW (ref 37.5–51.0)
IMMATURE GRANS (ABS): 0 10*3/uL (ref 0.0–0.1)
Immature Granulocytes: 0 %
LYMPHS: 23 %
Lymphocytes Absolute: 1.5 10*3/uL (ref 0.7–3.1)
MCH: 30.9 pg (ref 26.6–33.0)
MCHC: 33.8 g/dL (ref 31.5–35.7)
MCV: 92 fL (ref 79–97)
MONOCYTES: 9 %
Monocytes Absolute: 0.6 10*3/uL (ref 0.1–0.9)
NEUTROS ABS: 3.8 10*3/uL (ref 1.4–7.0)
Neutrophils: 58 %
Platelets: 223 10*3/uL (ref 150–379)
RBC: 3.98 x10E6/uL — ABNORMAL LOW (ref 4.14–5.80)
RDW: 13.9 % (ref 12.3–15.4)
WBC: 6.5 10*3/uL (ref 3.4–10.8)

## 2016-09-26 LAB — COMPREHENSIVE METABOLIC PANEL
ALBUMIN: 3.8 g/dL (ref 3.5–4.7)
ALT: 13 IU/L (ref 0–44)
AST: 16 IU/L (ref 0–40)
Albumin/Globulin Ratio: 1.4 (ref 1.2–2.2)
Alkaline Phosphatase: 76 IU/L (ref 39–117)
BILIRUBIN TOTAL: 0.9 mg/dL (ref 0.0–1.2)
BUN / CREAT RATIO: 18 (ref 10–24)
BUN: 20 mg/dL (ref 8–27)
CO2: 24 mmol/L (ref 18–29)
CREATININE: 1.13 mg/dL (ref 0.76–1.27)
Calcium: 8.9 mg/dL (ref 8.6–10.2)
Chloride: 104 mmol/L (ref 96–106)
GFR calc non Af Amer: 58 mL/min/{1.73_m2} — ABNORMAL LOW (ref >59)
GFR, EST AFRICAN AMERICAN: 67 mL/min/{1.73_m2} (ref >59)
GLOBULIN, TOTAL: 2.8 g/dL (ref 1.5–4.5)
Glucose: 96 mg/dL (ref 65–99)
Potassium: 4.1 mmol/L (ref 3.5–5.2)
Sodium: 142 mmol/L (ref 134–144)
TOTAL PROTEIN: 6.6 g/dL (ref 6.0–8.5)

## 2016-09-26 LAB — LIPID PANEL
CHOLESTEROL TOTAL: 118 mg/dL (ref 100–199)
Chol/HDL Ratio: 2.7 ratio units (ref 0.0–5.0)
HDL: 43 mg/dL (ref >39)
LDL Calculated: 61 mg/dL (ref 0–99)
Triglycerides: 69 mg/dL (ref 0–149)
VLDL CHOLESTEROL CAL: 14 mg/dL (ref 5–40)

## 2016-09-26 LAB — TSH: TSH: 1.27 u[IU]/mL (ref 0.450–4.500)

## 2016-09-28 ENCOUNTER — Encounter: Payer: Self-pay | Admitting: Family Medicine

## 2016-12-13 LAB — HM DIABETES EYE EXAM

## 2017-01-07 ENCOUNTER — Ambulatory Visit (INDEPENDENT_AMBULATORY_CARE_PROVIDER_SITE_OTHER): Payer: Medicare Other | Admitting: Family Medicine

## 2017-01-07 ENCOUNTER — Encounter: Payer: Self-pay | Admitting: Family Medicine

## 2017-01-07 VITALS — BP 133/70 | HR 72 | Temp 97.5°F | Wt 203.6 lb

## 2017-01-07 DIAGNOSIS — I10 Essential (primary) hypertension: Secondary | ICD-10-CM | POA: Diagnosis not present

## 2017-01-07 DIAGNOSIS — N138 Other obstructive and reflux uropathy: Secondary | ICD-10-CM

## 2017-01-07 DIAGNOSIS — E1149 Type 2 diabetes mellitus with other diabetic neurological complication: Secondary | ICD-10-CM | POA: Diagnosis not present

## 2017-01-07 DIAGNOSIS — E785 Hyperlipidemia, unspecified: Secondary | ICD-10-CM

## 2017-01-07 DIAGNOSIS — N401 Enlarged prostate with lower urinary tract symptoms: Secondary | ICD-10-CM | POA: Diagnosis not present

## 2017-01-07 LAB — BAYER DCA HB A1C WAIVED: HB A1C (BAYER DCA - WAIVED): 6.3 % (ref <7.0)

## 2017-01-07 NOTE — Assessment & Plan Note (Signed)
The current medical regimen is effective;  continue present plan and medications.  

## 2017-01-07 NOTE — Progress Notes (Signed)
BP 133/70 (BP Location: Left Arm, Patient Position: Sitting, Cuff Size: Normal)   Pulse 72   Temp 97.5 F (36.4 C)   Wt 203 lb 9.6 oz (92.4 kg)   SpO2 96%   BMI 30.50 kg/m    Subjective:    Patient ID: Jeffery Horne, male    DOB: [birth year], 83 y.o.   MRN: [id number]  HPI: Jeffery Horne is a 83 y.o. male  Chief Complaint  Patient presents with  . Diabetes  Patient follow-up doing well noted diabetes concerns or complaints peripheral neuropathy doing much better. No real complaints noted low blood sugar spells BPH symptoms stable not sure whether tamsulosin or Cardura one of the other is better has some tamsulosin leftover so will take that. Blood pressure renal function stable no complaints from medications. Cholesterol doing well no complaints.   Relevant past medical, surgical, family and social history reviewed and updated as indicated. Interim medical history since our last visit reviewed. Allergies and medications reviewed and updated.  Review of Systems  Constitutional: Negative.   Respiratory: Negative.   Cardiovascular: Negative.     Per HPI unless specifically indicated above     Objective:    BP 133/70 (BP Location: Left Arm, Patient Position: Sitting, Cuff Size: Normal)   Pulse 72   Temp 97.5 F (36.4 C)   Wt 203 lb 9.6 oz (92.4 kg)   SpO2 96%   BMI 30.50 kg/m   Wt Readings from Last 3 Encounters:  01/07/17 203 lb 9.6 oz (92.4 kg)  09/25/16 196 lb (88.9 kg)  05/16/16 196 lb (88.9 kg)    Physical Exam  Constitutional: He is oriented to person, place, and time. He appears well-developed and well-nourished.  HENT:  Head: Normocephalic and atraumatic.  Eyes: Conjunctivae and EOM are normal.  Neck: Normal range of motion.  Cardiovascular: Normal rate, regular rhythm and normal heart sounds.   Pulmonary/Chest: Effort normal and breath sounds normal.  Musculoskeletal: Normal range of motion.  Neurological: He is alert and oriented to person,  place, and time.  Skin: No erythema.  Psychiatric: He has a normal mood and affect. His behavior is normal. Judgment and thought content normal.    Results for orders placed or performed in visit on 12/18/16  HM DIABETES EYE EXAM  Result Value Ref Range   HM Diabetic Eye Exam No Retinopathy No Retinopathy      Assessment & Plan:   Problem List Items Addressed This Visit      Cardiovascular and Mediastinum   Hypertension - Primary    The current medical regimen is effective;  continue present plan and medications.       Relevant Orders   Bayer DCA Hb A1c Waived     Endocrine   Type II diabetes mellitus with neurological manifestations (HCC)    The current medical regimen is effective;  continue present plan and medications.       Relevant Orders   Bayer DCA Hb A1c Waived     Genitourinary   BPH with obstruction/lower urinary tract symptoms    The current medical regimen is effective;  continue present plan and medications.         Other   Hyperlipidemia    The current medical regimen is effective;  continue present plan and medications.       Relevant Orders   Bayer DCA Hb A1c Waived       Follow up plan: Return in about 3 months (around 04/08/2017)  for Hemoglobin A1c, BMP,  Lipids, ALT, AST.

## 2017-01-14 ENCOUNTER — Telehealth: Payer: Self-pay | Admitting: Family Medicine

## 2017-01-14 MED ORDER — GLUCOSE BLOOD VI STRP: ORAL_STRIP | 12 refills | Status: DC

## 2017-01-14 NOTE — Telephone Encounter (Signed)
Pt would like a refill for one touch ultra test strips sent to [facility].

## 2017-01-14 NOTE — Telephone Encounter (Signed)
Diabetic strips sent to pharmacy.

## 2017-04-09 ENCOUNTER — Encounter: Payer: Self-pay | Admitting: Family Medicine

## 2017-04-09 ENCOUNTER — Ambulatory Visit (INDEPENDENT_AMBULATORY_CARE_PROVIDER_SITE_OTHER): Payer: Medicare Other | Admitting: Family Medicine

## 2017-04-09 DIAGNOSIS — E785 Hyperlipidemia, unspecified: Secondary | ICD-10-CM | POA: Diagnosis not present

## 2017-04-09 DIAGNOSIS — E1149 Type 2 diabetes mellitus with other diabetic neurological complication: Secondary | ICD-10-CM

## 2017-04-09 DIAGNOSIS — N401 Enlarged prostate with lower urinary tract symptoms: Secondary | ICD-10-CM

## 2017-04-09 DIAGNOSIS — I10 Essential (primary) hypertension: Secondary | ICD-10-CM

## 2017-04-09 DIAGNOSIS — N138 Other obstructive and reflux uropathy: Secondary | ICD-10-CM

## 2017-04-09 MED ORDER — TAMSULOSIN HCL 0.4 MG PO CAPS: 0.4000 mg | ORAL_CAPSULE | Freq: Every day | ORAL | 3 refills | Status: DC

## 2017-04-09 NOTE — Assessment & Plan Note (Signed)
The current medical regimen is effective;  continue present plan and medications.  

## 2017-04-09 NOTE — Progress Notes (Signed)
   BP 132/60 (BP Location: Left Arm)   Pulse 93   Wt 193 lb (87.5 kg)   SpO2 96%   BMI 28.92 kg/m    Subjective:    Patient ID: Jeffery Horne, male    DOB: [birth year], 83 y.o.   MRN: [id number]  HPI: Jeffery Horne is a 83 y.o. male  Chief Complaint  Patient presents with  . Follow-up  . Hypertension  . Diabetes  Patient follow-up diabetes doing well noted low blood sugar spells or issues. Blood pressure doing well without complaints. Neuropathy doing okay with gabapentin. cholesterol also doing well as his BPH. Patient switched from Cardura to tamsulosin and prefers that better.  Relevant past medical, surgical, family and social history reviewed and updated as indicated. Interim medical history since our last visit reviewed. Allergies and medications reviewed and updated.  Review of Systems  Constitutional: Negative.   Respiratory: Negative.   Cardiovascular: Negative.     Per HPI unless specifically indicated above     Objective:    BP 132/60 (BP Location: Left Arm)   Pulse 93   Wt 193 lb (87.5 kg)   SpO2 96%   BMI 28.92 kg/m   Wt Readings from Last 3 Encounters:  04/09/17 193 lb (87.5 kg)  01/07/17 203 lb 9.6 oz (92.4 kg)  09/25/16 196 lb (88.9 kg)    Physical Exam  Constitutional: He is oriented to person, place, and time. He appears well-developed and well-nourished.  HENT:  Head: Normocephalic and atraumatic.  Eyes: Conjunctivae and EOM are normal.  Neck: Normal range of motion.  Cardiovascular: Normal rate, regular rhythm and normal heart sounds.   Pulmonary/Chest: Effort normal and breath sounds normal.  Musculoskeletal: Normal range of motion.  Neurological: He is alert and oriented to person, place, and time.  Skin: No erythema.  Psychiatric: He has a normal mood and affect. His behavior is normal. Judgment and thought content normal.    Results for orders placed or performed in visit on 01/07/17  Bayer DCA Hb A1c Waived  Result Value  Ref Range   Bayer DCA Hb A1c Waived 6.3 <7.0 %      Assessment & Plan:   Problem List Items Addressed This Visit      Cardiovascular and Mediastinum   Hypertension    The current medical regimen is effective;  continue present plan and medications.       Relevant Orders   Basic metabolic panel   Bayer DCA Hb W0JA1c Waived   LP+ALT+AST Piccolo, Waived     Endocrine   Type II diabetes mellitus with neurological manifestations (HCC)   Relevant Orders   Basic metabolic panel   Bayer DCA Hb W1XA1c Waived   LP+ALT+AST Piccolo, Waived     Genitourinary   BPH with obstruction/lower urinary tract symptoms    The current medical regimen is effective;  continue present plan and medications.       Relevant Medications   tamsulosin (FLOMAX) 0.4 MG CAPS capsule     Other   Hyperlipidemia    The current medical regimen is effective;  continue present plan and medications.       Relevant Orders   Basic metabolic panel   Bayer DCA Hb B1YA1c Waived   LP+ALT+AST Piccolo, Waived       Follow up plan: No Follow-up on file.

## 2017-04-10 ENCOUNTER — Encounter: Payer: Self-pay | Admitting: Family Medicine

## 2017-04-10 LAB — BASIC METABOLIC PANEL
BUN/Creatinine Ratio: 15 (ref 10–24)
BUN: 17 mg/dL (ref 8–27)
CALCIUM: 8.7 mg/dL (ref 8.6–10.2)
CHLORIDE: 105 mmol/L (ref 96–106)
CO2: 23 mmol/L (ref 20–29)
Creatinine, Ser: 1.12 mg/dL (ref 0.76–1.27)
GFR, EST AFRICAN AMERICAN: 67 mL/min/{1.73_m2} (ref >59)
GFR, EST NON AFRICAN AMERICAN: 58 mL/min/{1.73_m2} — AB (ref >59)
Glucose: 117 mg/dL — ABNORMAL HIGH (ref 65–99)
Potassium: 4.4 mmol/L (ref 3.5–5.2)
Sodium: 143 mmol/L (ref 134–144)

## 2017-04-15 LAB — LP+ALT+AST PICCOLO, WAIVED
ALT (SGPT) Piccolo, Waived: 24 U/L (ref 10–47)
AST (SGOT) Piccolo, Waived: 23 U/L (ref 11–38)
CHOL/HDL RATIO PICCOLO,WAIVE: 2.8 mg/dL
Cholesterol Piccolo, Waived: 126 mg/dL (ref <200)
HDL CHOL PICCOLO, WAIVED: 45 mg/dL — AB (ref >59)
LDL CHOL CALC PICCOLO WAIVED: 69 mg/dL (ref <100)
TRIGLYCERIDES PICCOLO,WAIVED: 64 mg/dL (ref <150)
VLDL Chol Calc Piccolo,Waive: 13 mg/dL (ref <30)

## 2017-04-15 LAB — BAYER DCA HB A1C WAIVED: HB A1C (BAYER DCA - WAIVED): 6.1 %

## 2017-07-11 ENCOUNTER — Ambulatory Visit: Payer: Medicare Other | Admitting: Family Medicine

## 2017-07-31 ENCOUNTER — Ambulatory Visit (INDEPENDENT_AMBULATORY_CARE_PROVIDER_SITE_OTHER): Payer: Medicare Other | Admitting: Family Medicine

## 2017-07-31 ENCOUNTER — Encounter: Payer: Self-pay | Admitting: Family Medicine

## 2017-07-31 VITALS — BP 155/77 | HR 77 | Wt 195.0 lb

## 2017-07-31 DIAGNOSIS — E1149 Type 2 diabetes mellitus with other diabetic neurological complication: Secondary | ICD-10-CM

## 2017-07-31 LAB — BAYER DCA HB A1C WAIVED: HB A1C: 5.8 % (ref <7.0)

## 2017-07-31 NOTE — Progress Notes (Signed)
BP (!) 155/77   Pulse 77   Wt 195 lb (88.5 kg)   SpO2 98%   BMI 29.21 kg/m    Subjective:    Patient ID: Jeffery Horne, male    DOB: 11-04-1926, 83 y.o.   MRN: [id number]  HPI: Jeffery Horne is a 83 y.o. male  Chief Complaint  Patient presents with  . Follow-up  atient follow-up doing well diabetes noted low blood sugar spells no issues. Taking  Blood pressure medicines without problems cholesterol medicines without problems and good control.Gabapentin for neuropathy is stable. On further review with patient enalapril 5 mg is taking one half tablet a day and metoprolol 25 mg is taking one half tablet twice a day. Patient's feeling better with thisand apparently good blood pressure control.  Relevant past medical, surgical, family and social history reviewed and updated as indicated. Interim medical history since our last visit reviewed. Allergies and medications reviewed and updated.  Review of Systems  Constitutional: Negative.   Respiratory: Negative.   Cardiovascular: Negative.     Per HPI unless specifically indicated above     Objective:    BP (!) 155/77   Pulse 77   Wt 195 lb (88.5 kg)   SpO2 98%   BMI 29.21 kg/m   Wt Readings from Last 3 Encounters:  07/31/17 195 lb (88.5 kg)  04/09/17 193 lb (87.5 kg)  01/07/17 203 lb 9.6 oz (92.4 kg)    Physical Exam  Constitutional: He is oriented to person, place, and time. He appears well-developed and well-nourished.  HENT:  Head: Normocephalic and atraumatic.  Eyes: Conjunctivae and EOM are normal.  Neck: Normal range of motion.  Cardiovascular: Normal rate, regular rhythm and normal heart sounds.  Pulmonary/Chest: Effort normal and breath sounds normal.  Musculoskeletal: Normal range of motion.  Neurological: He is alert and oriented to person, place, and time.  Skin: No erythema.  Psychiatric: He has a normal mood and affect. His behavior is normal. Judgment and thought content normal.    Results for  orders placed or performed in visit on 04/09/17  Basic metabolic panel  Result Value Ref Range   Glucose 117 (H) 65 - 99 mg/dL   BUN 17 8 - 27 mg/dL   Creatinine, Ser 8.291.12 0.76 - 1.27 mg/dL   GFR calc non Af Amer 58 (L) >59 mL/min/1.73   GFR calc Af Amer 67 >59 mL/min/1.73   BUN/Creatinine Ratio 15 10 - 24   Sodium 143 134 - 144 mmol/L   Potassium 4.4 3.5 - 5.2 mmol/L   Chloride 105 96 - 106 mmol/L   CO2 23 20 - 29 mmol/L   Calcium 8.7 8.6 - 10.2 mg/dL  Bayer DCA Hb F6OA1c Waived  Result Value Ref Range   Bayer DCA Hb A1c Waived 6.1 <7.0 %  LP+ALT+AST Piccolo, Waived  Result Value Ref Range   ALT (SGPT) Piccolo, Waived 24 10 - 47 U/L   AST (SGOT) Piccolo, Waived 23 11 - 38 U/L   Cholesterol Piccolo, Waived 126 <200 mg/dL   HDL Chol Piccolo, Waived 45 (L) >59 mg/dL   Triglycerides Piccolo,Waived 64 <150 mg/dL   Chol/HDL Ratio Piccolo,Waive 2.8 mg/dL   LDL Chol Calc Piccolo Waived 69 <100 mg/dL   VLDL Chol Calc Piccolo,Waive 13 <30 mg/dL      Assessment & Plan:   Problem List Items Addressed This Visit      Endocrine   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

## 2017-08-21 ENCOUNTER — Encounter: Payer: Self-pay | Admitting: Family Medicine

## 2017-08-21 ENCOUNTER — Ambulatory Visit (INDEPENDENT_AMBULATORY_CARE_PROVIDER_SITE_OTHER): Payer: Medicare Other | Admitting: Family Medicine

## 2017-08-21 VITALS — BP 150/66 | HR 77 | Temp 97.8°F | Wt 192.4 lb

## 2017-08-21 DIAGNOSIS — I809 Phlebitis and thrombophlebitis of unspecified site: Secondary | ICD-10-CM | POA: Diagnosis not present

## 2017-08-21 MED ORDER — SULFAMETHOXAZOLE-TRIMETHOPRIM 800-160 MG PO TABS: 1.0000 | ORAL_TABLET | Freq: Two times a day (BID) | ORAL | 0 refills | Status: DC

## 2017-08-21 NOTE — Progress Notes (Signed)
BP (!) 150/66 (BP Location: Right Arm, Patient Position: Sitting, Cuff Size: Normal)   Pulse 77   Temp 97.8 F (36.6 C) (Oral)   Wt 192 lb 6.4 oz (87.3 kg)   SpO2 98%   BMI 28.83 kg/m    Subjective:    Patient ID: Jeffery Horne, male    DOB: 1927/07/24, 83 y.o.   MRN: [id number]  HPI: Jeffery GreenlandLeonard K Karim is a 83 y.o. male  Chief Complaint  Patient presents with  . Leg Pain    Left leg. Since Sunday. Knot on bottom limb. Patient is concerned for blood clot.   Patient here with left lower leg pain, redness, and warmth x 3 days. Noticed a hard knot that came up in the area of redness in medial left lower leg. Painful to touch. Does not recall hitting the area on anything. No hx of DVT, recent surgery, sedentary lifestyle, recent smoking. No active CP, SOB, palpitations, fever, chills. Minimal edema in area. Pt not trying anything OTC for sxs.   Relevant past medical, surgical, family and social history reviewed and updated as indicated. Interim medical history since our last visit reviewed. Allergies and medications reviewed and updated.  Review of Systems  Constitutional: Negative.   HENT: Negative.   Eyes: Negative.   Respiratory: Negative.   Cardiovascular: Negative.   Gastrointestinal: Negative.   Genitourinary: Negative.   Musculoskeletal:       Left lower leg pain  Skin: Positive for color change.       Firm knot on left lower leg  Neurological: Negative.   Psychiatric/Behavioral: Negative.    Per HPI unless specifically indicated above     Objective:    BP (!) 150/66 (BP Location: Right Arm, Patient Position: Sitting, Cuff Size: Normal)   Pulse 77   Temp 97.8 F (36.6 C) (Oral)   Wt 192 lb 6.4 oz (87.3 kg)   SpO2 98%   BMI 28.83 kg/m   Wt Readings from Last 3 Encounters:  08/21/17 192 lb 6.4 oz (87.3 kg)  07/31/17 195 lb (88.5 kg)  04/09/17 193 lb (87.5 kg)    Physical Exam  Constitutional: He is oriented to person, place, and time. He appears  well-developed and well-nourished. No distress.  HENT:  Head: Atraumatic.  Eyes: Conjunctivae are normal. Pupils are equal, round, and reactive to light. No scleral icterus.  Neck: Normal range of motion. Neck supple.  Cardiovascular: Normal rate, normal heart sounds and intact distal pulses.  Pulmonary/Chest: Effort normal and breath sounds normal. No respiratory distress.  Musculoskeletal: Normal range of motion.  Neurological: He is alert and oriented to person, place, and time.  Skin: Skin is warm and dry. There is erythema (and firm, tender superficial nodule mid-left medial calf).  Psychiatric: He has a normal mood and affect. His behavior is normal.  Nursing note and vitals reviewed.  Results for orders placed or performed in visit on 07/31/17  Bayer DCA Hb A1c Waived  Result Value Ref Range   Bayer DCA Hb A1c Waived 5.8 <7.0 %      Assessment & Plan:   Problem List Items Addressed This Visit    None    Visit Diagnoses    Phlebitis    -  Primary    Appearance most consistent with a superficial phlebitis with possible early cellulitis given warmth, spreading redness, and tenderness. Cannot r/o DVT at this time but pt wanting to try conservative measures before getting an ultrasound. Consulted Dr. Dossie Arbourrissman, who  agreed with a plan of warm compresses, elevation, continued baby aspirin, and bactrim to cover possible infection. Will recheck in 2 days and order u/s at that time if no improvement. Long discussion regarding return precautions, such as pain behind knee or above knee, CP, SOB, cold extremity, worsening streaking, fevers, etc. Pt agreeable to this plan.    Follow up plan: Return in about 2 days (around 08/23/2017) for Leg recheck.

## 2017-08-22 NOTE — Patient Instructions (Signed)
Follow up in 2 days

## 2017-08-23 ENCOUNTER — Ambulatory Visit (INDEPENDENT_AMBULATORY_CARE_PROVIDER_SITE_OTHER): Payer: Medicare Other | Admitting: Family Medicine

## 2017-08-23 ENCOUNTER — Encounter: Payer: Self-pay | Admitting: Family Medicine

## 2017-08-23 VITALS — BP 129/66 | HR 78 | Temp 97.9°F | Wt 192.2 lb

## 2017-08-23 DIAGNOSIS — I809 Phlebitis and thrombophlebitis of unspecified site: Secondary | ICD-10-CM

## 2017-08-23 NOTE — Progress Notes (Signed)
BP 129/66 (BP Location: Left Arm, Patient Position: Sitting, Cuff Size: Normal)   Pulse 78   Temp 97.9 F (36.6 C) (Oral)   Wt 192 lb 3.2 oz (87.2 kg)   SpO2 96%   BMI 28.80 kg/m    Subjective:    Patient ID: Jeffery Horne Draeger, male    DOB: 1927/05/11, 83 y.o.   MRN: [id number]  HPI: Jeffery Horne Vales is a 83 y.o. male  Chief Complaint  Patient presents with  . Leg Pain    Patient states his leg hurts some, but not a whole lot anymore. He can walk and it doesn't hurt when he walks anymore. Knot is still there, but has gone down.  . Follow-up  . Phlebitis   Patient presents for 2 day leg pain recheck for suspected phlebitis with possible early cellulitis. Has been taking the bactrim without difficulty, and doing epsom salt soaks and warm compresses as well as elevating leg. Pain and redness are improved, still some very mild tenderness in area of knot but not nearly as much as previously. Still trace edema which pt states is baseline since a knee surgery years ago. Denies CP, SOB, pain elevating above knee, worsening with walking.   Relevant past medical, surgical, family and social history reviewed and updated as indicated. Interim medical history since our last visit reviewed. Allergies and medications reviewed and updated.  Review of Systems  Constitutional: Negative.   Respiratory: Negative.   Cardiovascular: Negative.   Gastrointestinal: Negative.   Musculoskeletal:       Left lower leg pain  Neurological: Negative.   Psychiatric/Behavioral: Negative.    Per HPI unless specifically indicated above     Objective:    BP 129/66 (BP Location: Left Arm, Patient Position: Sitting, Cuff Size: Normal)   Pulse 78   Temp 97.9 F (36.6 C) (Oral)   Wt 192 lb 3.2 oz (87.2 kg)   SpO2 96%   BMI 28.80 kg/m   Wt Readings from Last 3 Encounters:  08/23/17 192 lb 3.2 oz (87.2 kg)  08/21/17 192 lb 6.4 oz (87.3 kg)  07/31/17 195 lb (88.5 kg)    Physical Exam  Constitutional:  He is oriented to person, place, and time. He appears well-developed and well-nourished. No distress.  HENT:  Head: Atraumatic.  Eyes: Conjunctivae are normal. Pupils are equal, round, and reactive to light. No scleral icterus.  Neck: Normal range of motion. Neck supple.  Cardiovascular: Normal rate and intact distal pulses.  Pulmonary/Chest: Effort normal and breath sounds normal.  Musculoskeletal: Normal range of motion. He exhibits edema (trace left lower leg, at baseline per pt) and tenderness (minimal ttp over small knot in left lower leg, improved from prior visit).  No posterior lower leg ttp, - homans sign and squeeze test  Lymphadenopathy:    He has no cervical adenopathy.  Neurological: He is alert and oriented to person, place, and time.  Skin: Skin is warm and dry. There is erythema (mild erythema remaining around area of knot on left lower leg).  Psychiatric: He has a normal mood and affect. His behavior is normal.  Nursing note and vitals reviewed.  Results for orders placed or performed in visit on 07/31/17  Bayer DCA Hb A1c Waived  Result Value Ref Range   Bayer DCA Hb A1c Waived 5.8 <7.0 %      Assessment & Plan:   Problem List Items Addressed This Visit    None    Visit Diagnoses  Phlebitis    -  Primary   Significantly improved after 2 days, will continue full course of abx and compresses. Recheck in 1 week unless worsening in meantime. Return precautions reviewe      Follow up plan: Return in about 1 week (around 08/30/2017) for Leg recheck.

## 2017-08-26 ENCOUNTER — Telehealth: Payer: Self-pay | Admitting: Family Medicine

## 2017-08-26 NOTE — Telephone Encounter (Signed)
Copied from CRM [id number]. Topic: Quick Communication - See Telephone Encounter >> Aug 26, 2017  2:03 PM Clack, Princella PellegriniJessica D wrote: CRM for notification. See Telephone encounter for:  Pt would like Jeffery MaserRachel Lane to give him a call, states he has some questions about soaking his leg. 08/26/17.

## 2017-08-26 NOTE — Patient Instructions (Signed)
Follow up in 1 week

## 2017-08-27 NOTE — Telephone Encounter (Signed)
Jeffery Horne,  Did you give any information to patient about soaking his leg or do I need to call and find out what exactly he means?

## 2017-08-27 NOTE — Telephone Encounter (Signed)
Patient just needed to know how many times to soak. Patient notified.

## 2017-08-27 NOTE — Telephone Encounter (Signed)
Yeah, he's just supposed to be doing epsom salt soaks about 1-2 times daily for that lower leg that's bothering him

## 2017-08-30 ENCOUNTER — Encounter: Payer: Self-pay | Admitting: Family Medicine

## 2017-08-30 ENCOUNTER — Ambulatory Visit (INDEPENDENT_AMBULATORY_CARE_PROVIDER_SITE_OTHER): Payer: Medicare Other | Admitting: Family Medicine

## 2017-08-30 VITALS — BP 129/70 | HR 63 | Wt 192.0 lb

## 2017-08-30 DIAGNOSIS — R2242 Localized swelling, mass and lump, left lower limb: Secondary | ICD-10-CM

## 2017-08-30 NOTE — Progress Notes (Signed)
   BP 129/70   Pulse 63   Wt 192 lb (87.1 kg)   SpO2 96%   BMI 28.77 kg/m    Subjective:    Patient ID: Jeffery Horne, male    DOB: 1927/07/03, 83 y.o.   MRN: [id number]  HPI: Jeffery Horne is a 83 y.o. male  Chief Complaint  Patient presents with  . Follow-up    Leg Recheck   Patient here for 1 week leg recheck for left lower leg mass and redness. Has completed abx and soaks leg and uses heat, knot is much smaller and redness has improved. No longer having any pain, even with direct pressure. Still no CP, SOB, palpitations.   Relevant past medical, surgical, family and social history reviewed and updated as indicated. Interim medical history since our last visit reviewed. Allergies and medications reviewed and updated.  Review of Systems  Constitutional: Negative.   Respiratory: Negative.   Cardiovascular: Negative.   Gastrointestinal: Negative.   Musculoskeletal: Negative.   Skin: Positive for color change (and mass left lower leg).  Neurological: Negative.   Psychiatric/Behavioral: Negative.    Per HPI unless specifically indicated above     Objective:    BP 129/70   Pulse 63   Wt 192 lb (87.1 kg)   SpO2 96%   BMI 28.77 kg/m   Wt Readings from Last 3 Encounters:  08/30/17 192 lb (87.1 kg)  08/23/17 192 lb 3.2 oz (87.2 kg)  08/21/17 192 lb 6.4 oz (87.3 kg)    Physical Exam  Constitutional: He appears well-developed and well-nourished.  HENT:  Head: Atraumatic.  Eyes: Conjunctivae are normal. Pupils are equal, round, and reactive to light.  Neck: Normal range of motion. Neck supple.  Cardiovascular: Normal rate and normal heart sounds.  Pulmonary/Chest: Effort normal and breath sounds normal. No respiratory distress.  Musculoskeletal: Normal range of motion. He exhibits edema (stable chronic edema of left lower leg). He exhibits no tenderness or deformity.  Lymphadenopathy:    He has no cervical adenopathy.  Skin: Skin is warm and dry. There is  erythema (minimal erythema left lower leg).  Mass left lower leg much smaller and less firm than previously, non-tender to palpation at this time  Psychiatric: He has a normal mood and affect. His behavior is normal.  Nursing note and vitals reviewed.     Assessment & Plan:   Problem List Items Addressed This Visit    None    Visit Diagnoses    Lower leg mass, left    -  Primary   Significant improvement, but area still present - will get u/s for reassurance. Continue soaks, heat, OTC pain relievers. Return precautions reviewed   Relevant Orders   US Venous Img Lower Unilateral Left       Follow up plan: Return if symptoms worsen or fail to improve.

## 2017-09-01 NOTE — Patient Instructions (Signed)
Follow up as needed

## 2017-09-13 ENCOUNTER — Ambulatory Visit (INDEPENDENT_AMBULATORY_CARE_PROVIDER_SITE_OTHER): Payer: Medicare Other

## 2017-09-13 ENCOUNTER — Telehealth: Payer: Self-pay | Admitting: Family Medicine

## 2017-09-13 VITALS — BP 131/71 | HR 69 | Temp 97.8°F | Resp 16 | Ht 69.0 in | Wt 189.0 lb

## 2017-09-13 DIAGNOSIS — Z0000 Encounter for general adult medical examination without abnormal findings: Secondary | ICD-10-CM | POA: Diagnosis not present

## 2017-09-13 NOTE — Telephone Encounter (Signed)
Lupita LeashDonna notified and [facility]

## 2017-09-13 NOTE — Patient Instructions (Signed)
Jeffery Horne , Thank you for taking time to come for your Medicare Wellness Visit. I appreciate your ongoing commitment to your health goals. Please review the following plan we discussed and let me know if I can assist you in the future.   Screening recommendations/referrals: Colonoscopy: no longer required  Recommended yearly ophthalmology/optometry visit for glaucoma screening and checkup Recommended yearly dental visit for hygiene and checkup  Vaccinations: Influenza vaccine: up to date  Pneumococcal vaccine: up to date  Tdap vaccine: up to date  Shingles vaccine: due, check with your insurance company for coverage  Advanced directives: Advance directive discussed with you today. Even though you declined this today please call our office should you change your mind and we can give you the proper paperwork for you to fill out.  Conditions/risks identified: Recommend drinking at least 6-8 glasses of water a day   Next appointment: Follow up on 10/01/2016 at 8:00am with Dr.Crissman. Follow up in one year for your annual wellness exam.   Preventive Care 65 Years and Older, Male Preventive care refers to lifestyle choices and visits with your health care provider that can promote health and wellness. What does preventive care include?  A yearly physical exam. This is also called an annual well check.  Dental exams once or twice a year.  Routine eye exams. Ask your health care provider how often you should have your eyes checked.  Personal lifestyle choices, including:  Daily care of your teeth and gums.  Regular physical activity.  Eating a healthy diet.  Avoiding tobacco and drug use.  Limiting alcohol use.  Practicing safe sex.  Taking low doses of aspirin every day.  Taking vitamin and mineral supplements as recommended by your health care provider. What happens during an annual well check? The services and screenings done by your health care provider during your annual  well check will depend on your age, overall health, lifestyle risk factors, and family history of disease. Counseling  Your health care provider may ask you questions about your:  Alcohol use.  Tobacco use.  Drug use.  Emotional well-being.  Home and relationship well-being.  Sexual activity.  Eating habits.  History of falls.  Memory and ability to understand (cognition).  Work and work Astronomerenvironment. Screening  You may have the following tests or measurements:  Height, weight, and BMI.  Blood pressure.  Lipid and cholesterol levels. These may be checked every 5 years, or more frequently if you are over 82 years old.  Skin check.  Lung cancer screening. You may have this screening every year starting at age 82 if you have a 30-pack-year history of smoking and currently smoke or have quit within the past 15 years.  Fecal occult blood test (FOBT) of the stool. You may have this test every year starting at age 950.  Flexible sigmoidoscopy or colonoscopy. You may have a sigmoidoscopy every 5 years or a colonoscopy every 10 years starting at age 82.  Prostate cancer screening. Recommendations will vary depending on your family history and other risks.  Hepatitis C blood test.  Hepatitis B blood test.  Sexually transmitted disease (STD) testing.  Diabetes screening. This is done by checking your blood sugar (glucose) after you have not eaten for a while (fasting). You may have this done every 1-3 years.  Abdominal aortic aneurysm (AAA) screening. You may need this if you are a current or former smoker.  Osteoporosis. You may be screened starting at age 82 if you are at  high risk. Talk with your health care provider about your test results, treatment options, and if necessary, the need for more tests. Vaccines  Your health care provider may recommend certain vaccines, such as:  Influenza vaccine. This is recommended every year.  Tetanus, diphtheria, and acellular  pertussis (Tdap, Td) vaccine. You may need a Td booster every 10 years.  Zoster vaccine. You may need this after age 53.  Pneumococcal 13-valent conjugate (PCV13) vaccine. One dose is recommended after age 18.  Pneumococcal polysaccharide (PPSV23) vaccine. One dose is recommended after age 41. Talk to your health care provider about which screenings and vaccines you need and how often you need them. This information is not intended to replace advice given to you by your health care provider. Make sure you discuss any questions you have with your health care provider. Document Released: 09/23/2015 Document Revised: 05/16/2016 Document Reviewed: 06/28/2015 Elsevier Interactive Patient Education  2017 [place] Prevention in the Home Falls can cause injuries. They can happen to people of all ages. There are many things you can do to make your home safe and to help prevent falls. What can I do on the outside of my home?  Regularly fix the edges of walkways and driveways and fix any cracks.  Remove anything that might make you trip as you walk through a door, such as a raised step or threshold.  Trim any bushes or trees on the path to your home.  Use bright outdoor lighting.  Clear any walking paths of anything that might make someone trip, such as rocks or tools.  Regularly check to see if handrails are loose or broken. Make sure that both sides of any steps have handrails.  Any raised decks and porches should have guardrails on the edges.  Have any leaves, snow, or ice cleared regularly.  Use sand or salt on walking paths during winter.  Clean up any spills in your garage right away. This includes oil or grease spills. What can I do in the bathroom?  Use night lights.  Install grab bars by the toilet and in the tub and shower. Do not use towel bars as grab bars.  Use non-skid mats or decals in the tub or shower.  If you need to sit down in the shower, use a plastic,  non-slip stool.  Keep the floor dry. Clean up any water that spills on the floor as soon as it happens.  Remove soap buildup in the tub or shower regularly.  Attach bath mats securely with double-sided non-slip rug tape.  Do not have throw rugs and other things on the floor that can make you trip. What can I do in the bedroom?  Use night lights.  Make sure that you have a light by your bed that is easy to reach.  Do not use any sheets or blankets that are too big for your bed. They should not hang down onto the floor.  Have a firm chair that has side arms. You can use this for support while you get dressed.  Do not have throw rugs and other things on the floor that can make you trip. What can I do in the kitchen?  Clean up any spills right away.  Avoid walking on wet floors.  Keep items that you use a lot in easy-to-reach places.  If you need to reach something above you, use a strong step stool that has a grab bar.  Keep electrical cords out of the  way.  Do not use floor polish or wax that makes floors slippery. If you must use wax, use non-skid floor wax.  Do not have throw rugs and other things on the floor that can make you trip. What can I do with my stairs?  Do not leave any items on the stairs.  Make sure that there are handrails on both sides of the stairs and use them. Fix handrails that are broken or loose. Make sure that handrails are as long as the stairways.  Check any carpeting to make sure that it is firmly attached to the stairs. Fix any carpet that is loose or worn.  Avoid having throw rugs at the top or bottom of the stairs. If you do have throw rugs, attach them to the floor with carpet tape.  Make sure that you have a light switch at the top of the stairs and the bottom of the stairs. If you do not have them, ask someone to add them for you. What else can I do to help prevent falls?  Wear shoes that:  Do not have high heels.  Have rubber  bottoms.  Are comfortable and fit you well.  Are closed at the toe. Do not wear sandals.  If you use a stepladder:  Make sure that it is fully opened. Do not climb a closed stepladder.  Make sure that both sides of the stepladder are locked into place.  Ask someone to hold it for you, if possible.  Clearly mark and make sure that you can see:  Any grab bars or handrails.  First and last steps.  Where the edge of each step is.  Use tools that help you move around (mobility aids) if they are needed. These include:  Canes.  Walkers.  Scooters.  Crutches.  Turn on the lights when you go into a dark area. Replace any light bulbs as soon as they burn out.  Set up your furniture so you have a clear path. Avoid moving your furniture around.  If any of your floors are uneven, fix them.  If there are any pets around you, be aware of where they are.  Review your medicines with your doctor. Some medicines can make you feel dizzy. This can increase your chance of falling. Ask your doctor what other things that you can do to help prevent falls. This information is not intended to replace advice given to you by your health care provider. Make sure you discuss any questions you have with your health care provider. Document Released: 06/23/2009 Document Revised: 02/02/2016 Document Reviewed: 10/01/2014 Elsevier Interactive Patient Education  2017 [employer].

## 2017-09-13 NOTE — Telephone Encounter (Signed)
Venous, just wanting pt reassurance/DVT r/o

## 2017-09-13 NOTE — Telephone Encounter (Signed)
Routing to all providers. 

## 2017-09-13 NOTE — Progress Notes (Signed)
Subjective:   Jeffery Horne is a 84 y.o. male who presents for Medicare Annual/Subsequent preventive examination.  Review of Systems:   Cardiac Risk Factors include: hypertension;advanced age (>955men, 70>65 women);male gender;diabetes mellitus;dyslipidemia     Objective:    Vitals: BP 131/71 (BP Location: Left Arm, Patient Position: Sitting)   Pulse 69   Temp 97.8 F (36.6 C) (Oral)   Resp 16   Ht 5\' 9"  (1.753 m)   Wt 189 lb (85.7 kg)   BMI 27.91 kg/m   Body mass index is 27.91 kg/m.  Advanced Directives 09/13/2017  Does Patient Have a Medical Advance Directive? No  Does patient want to make changes to medical advance directive? No - Patient declined    Tobacco Social History   Tobacco Use  Smoking Status Former Smoker  . Types: Cigarettes  . Last attempt to quit: 09/11/1971  . Years since quitting: 46.0  Smokeless Tobacco Never Used     Counseling given: Not Answered   Clinical Intake:  Pre-visit preparation completed: Yes  Pain : No/denies pain     Nutritional Status: BMI 25 -29 Overweight Nutritional Risks: None Diabetes: Yes CBG done?: No Did pt. bring in CBG monitor from home?: No  How often do you need to have someone help you when you read instructions, pamphlets, or other written materials from your doctor or pharmacy?: 1 - Never What is the last grade level you completed in school?: high school/GED  Interpreter Needed?: No  Information entered by :: Lateshia Schmoker,LPN   Past Medical History:  Diagnosis Date  . BPH (benign prostatic hypertrophy) with urinary obstruction   . Hearing impairment   . Hyperlipidemia   . Hypertension   . Primary gout   . Type II diabetes mellitus with neurological manifestations [facility](HCC)    Past Surgical History:  Procedure Laterality Date  . HERNIA REPAIR    . PACEMAKER INSERTION     Dr. Juel BurrowMasoud   Family History  Problem Relation Age of Onset  . Diabetes Mother   . Hypertension Mother   . Stroke Mother   .  Hypertension Father    Social History   Socioeconomic History  . Marital status: Widowed    Spouse name: None  . Number of children: None  . Years of education: None  . Highest education level: GED or equivalent  Social Needs  . Financial resource strain: Not hard at all  . Food insecurity - worry: Never true  . Food insecurity - inability: Never true  . Transportation needs - medical: No  . Transportation needs - non-medical: No  Occupational History  . None  Tobacco Use  . Smoking status: Former Smoker    Types: Cigarettes    Last attempt to quit: 09/11/1971    Years since quitting: 46.0  . Smokeless tobacco: Never Used  Substance and Sexual Activity  . Alcohol use: No  . Drug use: No  . Sexual activity: None  Other Topics Concern  . None  Social History Narrative  . None    Outpatient Encounter Medications as of 09/13/2017  Medication Sig  . aspirin EC 81 MG tablet Take 81 mg by mouth daily.  . enalapril (VASOTEC) 5 MG tablet Take 1 tablet (5 mg total) by mouth daily. (Patient taking differently: Take 5 mg by mouth daily. taked half a pill a day)  . fluticasone (FLONASE) 50 MCG/ACT nasal spray Place 2 sprays into both nostrils daily.  Marland Kitchen. gabapentin (NEURONTIN) 300 MG capsule Take  8 capsules (2,400 mg total) by mouth daily.  Marland Kitchen GLIPIZIDE XL 2.5 MG 24 hr tablet Take 1 tablet (2.5 mg total) by mouth daily.  Marland Kitchen glucose blood test strip Use as instructed  . metoprolol tartrate (LOPRESSOR) 25 MG tablet Take 1 tablet (25 mg total) by mouth daily. (Patient taking differently: Take 25 mg by mouth daily. Takes half a pill daily)  . simvastatin (ZOCOR) 20 MG tablet Take 1 tablet (20 mg total) by mouth daily.  . tamsulosin (FLOMAX) 0.4 MG CAPS capsule Take 1 capsule (0.4 mg total) by mouth daily.  . [DISCONTINUED] sulfamethoxazole-trimethoprim (BACTRIM DS,SEPTRA DS) 800-160 MG tablet Take 1 tablet by mouth 2 (two) times daily. (Patient not taking: Reported on 09/13/2017)   No  facility-administered encounter medications on file as of 09/13/2017.     Activities of Daily Living In your present state of health, do you have any difficulty performing the following activities: 09/13/2017  Hearing? Y  Comment has hearing aids  Vision? N  Difficulty concentrating or making decisions? N  Walking or climbing stairs? N  Dressing or bathing? N  Doing errands, shopping? N  Preparing Food and eating ? N  Using the Toilet? N  In the past six months, have you accidently leaked urine? N  Do you have problems with loss of bowel control? N  Managing your Medications? N  Managing your Finances? N  Housekeeping or managing your Housekeeping? N  Comment son helps  Some recent data might be hidden    Patient Care Team: Steele Sizer, MD as PCP - General (Family Medicine) Corky Downs, MD as Referring Physician (Cardiology) Gwyneth Revels, DPM as Referring Physician (Podiatry) Vernie Murders, MD (Otolaryngology) Rayann Heman, DC as Referring Physician (Chiropractic Medicine)   Assessment:   This is a routine wellness examination for [name].  Exercise Activities and Dietary recommendations Current Exercise Habits: The patient does not participate in regular exercise at present, Exercise limited by: None identified  Goals    . DIET - INCREASE WATER INTAKE     Recommend drinking at least 6-8 glasses of water a day        Fall Risk Fall Risk  09/13/2017 08/30/2017 07/31/2017 04/09/2017 09/25/2016  Falls in the past year? No No No Yes No  Number falls in past yr: - - - 1 -  Injury with Fall? - - - No -  Risk for fall due to : - - - Impaired mobility -   Is the patient's home free of loose throw rugs in walkways, pet beds, electrical cords, etc?   yes      Grab bars in the bathroom? yes      Handrails on the stairs?   yes      Adequate lighting?   yes  Timed Get Up and Go Performed: completed in 10 seconds with use of cane. Steady gait. No intervention needed at  this time.   Depression Screen PHQ 2/9 Scores 09/13/2017 08/30/2017 07/31/2017 04/09/2017  PHQ - 2 Score 0 0 0 0    Cognitive Function     6CIT Screen 09/13/2017  What Year? 0 points  What month? 0 points  What time? 0 points  Count back from 20 0 points  Months in reverse 0 points  Repeat phrase 0 points  Total Score 0    Immunization History  Administered Date(s) Administered  . Pneumococcal Conjugate-13 04/30/2014  . Pneumococcal-Unspecified 02/11/2001  . Td 04/15/2006    Qualifies for Shingles Vaccine? Discussed  option for shingrix   Screening Tests Health Maintenance  Topic Date Due  . TETANUS/TDAP  09/16/2017 (Originally 04/15/2016)  . INFLUENZA VACCINE  04/23/2018 (Originally 04/10/2017)  . OPHTHALMOLOGY EXAM  12/13/2017  . FOOT EXAM  01/07/2018  . HEMOGLOBIN A1C  01/28/2018  . PNA vac Low Risk Adult  Completed   Cancer Screenings: Lung: Low Dose CT Chest recommended if Age 63-80 years, 30 pack-year currently smoking OR have quit w/in 15years. Patient does not qualify. Colorectal: no longer required due to age   Additional Screenings:  Hepatitis B/HIV/Syphillis: not indicated Hepatitis C Screening: not indicated    Plan:    I have personally reviewed and addressed the Medicare Annual Wellness questionnaire and have noted the following in the patient's chart:  A. Medical and social history B. Use of alcohol, tobacco or illicit drugs  C. Current medications and supplements D. Functional ability and status E.  Nutritional status F.  Physical activity G. Advance directives H. List of other physicians I.  Hospitalizations, surgeries, and ER visits in previous 12 months J.  Vitals K. Screenings such as hearing and vision if needed, cognitive and depression L. Referrals and appointments   In addition, I have reviewed and discussed with patient certain preventive protocols, quality metrics, and best practice recommendations. A written personalized care plan for  preventive services as well as general preventive health recommendations were provided to patient.   Signed,  Marin Roberts, LPN Nurse Health Advisor   Nurse Notes: none

## 2017-09-13 NOTE — Telephone Encounter (Signed)
Copied from CRM [phone]. Topic: Quick Communication - See Telephone Encounter >> Sep 13, 2017  9:20 AM Landry MellowFoltz, Melissa J wrote: CRM for notification. See Telephone encounter for:   09/13/17. Parker with [facility] called - she would like to know if the u/s scheduled on Monday is supposed to be venous - looking at veins or if it needs to be a soft tissue u/s? The reason for us is listed as left leg mass.  Please call parker at hospital - [phone]. She needs answer today as appt is on Monday

## 2017-09-16 ENCOUNTER — Ambulatory Visit
Admission: RE | Admit: 2017-09-16 | Discharge: 2017-09-16 | Disposition: A | Discharge status: Home / Self Care | Payer: Medicare Other | Source: Ambulatory Visit | Attending: Family Medicine | Admitting: Family Medicine

## 2017-09-16 DIAGNOSIS — R2242 Localized swelling, mass and lump, left lower limb: Secondary | ICD-10-CM | POA: Diagnosis present

## 2017-10-01 ENCOUNTER — Encounter: Payer: Self-pay | Admitting: Family Medicine

## 2017-10-01 ENCOUNTER — Ambulatory Visit (INDEPENDENT_AMBULATORY_CARE_PROVIDER_SITE_OTHER): Payer: Medicare Other | Admitting: Family Medicine

## 2017-10-01 VITALS — BP 146/76 | HR 108 | Wt 194.0 lb

## 2017-10-01 DIAGNOSIS — I10 Essential (primary) hypertension: Secondary | ICD-10-CM | POA: Diagnosis not present

## 2017-10-01 DIAGNOSIS — E1149 Type 2 diabetes mellitus with other diabetic neurological complication: Secondary | ICD-10-CM

## 2017-10-01 DIAGNOSIS — R490 Dysphonia: Secondary | ICD-10-CM | POA: Diagnosis not present

## 2017-10-01 DIAGNOSIS — Z1329 Encounter for screening for other suspected endocrine disorder: Secondary | ICD-10-CM

## 2017-10-01 DIAGNOSIS — Z0000 Encounter for general adult medical examination without abnormal findings: Secondary | ICD-10-CM

## 2017-10-01 DIAGNOSIS — E7800 Pure hypercholesterolemia, unspecified: Secondary | ICD-10-CM | POA: Diagnosis not present

## 2017-10-01 DIAGNOSIS — N401 Enlarged prostate with lower urinary tract symptoms: Secondary | ICD-10-CM

## 2017-10-01 DIAGNOSIS — E785 Hyperlipidemia, unspecified: Secondary | ICD-10-CM

## 2017-10-01 DIAGNOSIS — N138 Other obstructive and reflux uropathy: Secondary | ICD-10-CM

## 2017-10-01 DIAGNOSIS — Z7189 Other specified counseling: Secondary | ICD-10-CM | POA: Insufficient documentation

## 2017-10-01 DIAGNOSIS — I495 Sick sinus syndrome: Secondary | ICD-10-CM

## 2017-10-01 DIAGNOSIS — M19072 Primary osteoarthritis, left ankle and foot: Secondary | ICD-10-CM | POA: Diagnosis not present

## 2017-10-01 DIAGNOSIS — L89311 Pressure ulcer of right buttock, stage 1: Secondary | ICD-10-CM | POA: Diagnosis not present

## 2017-10-01 DIAGNOSIS — L89309 Pressure ulcer of unspecified buttock, unspecified stage: Secondary | ICD-10-CM | POA: Insufficient documentation

## 2017-10-01 LAB — URINALYSIS, ROUTINE W REFLEX MICROSCOPIC
BILIRUBIN UA: NEGATIVE
GLUCOSE, UA: NEGATIVE
Ketones, UA: NEGATIVE
Leukocytes, UA: NEGATIVE
Nitrite, UA: NEGATIVE
PROTEIN UA: NEGATIVE
RBC, UA: NEGATIVE
SPEC GRAV UA: 1.025 (ref 1.005–1.030)
Urobilinogen, Ur: 0.2 mg/dL (ref 0.2–1.0)
pH, UA: 5.5 (ref 5.0–7.5)

## 2017-10-01 MED ORDER — TAMSULOSIN HCL 0.4 MG PO CAPS
0.4000 mg | ORAL_CAPSULE | Freq: Every day | ORAL | 4 refills | Status: DC
Start: 2017-10-01 — End: 2018-10-06

## 2017-10-01 MED ORDER — SIMVASTATIN 20 MG PO TABS: 20.0000 mg | ORAL_TABLET | Freq: Every day | ORAL | 4 refills | Status: DC

## 2017-10-01 MED ORDER — GABAPENTIN 300 MG PO CAPS: 2400.0000 mg | ORAL_CAPSULE | Freq: Every day | ORAL | 4 refills | Status: DC

## 2017-10-01 MED ORDER — METOPROLOL TARTRATE 25 MG PO TABS: 25.0000 mg | ORAL_TABLET | Freq: Every day | ORAL | 4 refills | Status: DC

## 2017-10-01 MED ORDER — ENALAPRIL MALEATE 5 MG PO TABS: 5.0000 mg | ORAL_TABLET | Freq: Every day | ORAL | 4 refills | Status: DC

## 2017-10-01 MED ORDER — GLIPIZIDE XL 2.5 MG PO TB24: 2.5000 mg | ORAL_TABLET | Freq: Every day | ORAL | 4 refills | Status: DC

## 2017-10-01 NOTE — Assessment & Plan Note (Signed)
Good control doing well

## 2017-10-01 NOTE — Assessment & Plan Note (Signed)
The current medical regimen is effective;  continue present plan and medications.  

## 2017-10-01 NOTE — Assessment & Plan Note (Signed)
Discussed cholesterol and myalgias arthralgias patient will do a trial of stopping restarting will notify us if any change off and on simvastatin.  Will stop for 2 weeks.

## 2017-10-01 NOTE — Assessment & Plan Note (Signed)
Stable followed by ear nose and throat

## 2017-10-01 NOTE — Assessment & Plan Note (Signed)
A voluntary discussion about advance care planning including the explanation and discussion of advance directives was extensively discussed  with the patient.  Explanation about the health care proxy and Living will was reviewed and packet with forms with explanation of how to fill them out was given.    

## 2017-10-01 NOTE — Progress Notes (Signed)
BP (!) 146/76   Pulse (!) 108   Wt 194 lb (88 kg)   SpO2 98%   BMI 28.65 kg/m    Subjective:    Patient ID: Jeffery Horne, male    DOB: 1927-04-24, 84 y.o.   MRN: [id number]  HPI: Jeffery Horne is a 84 y.o. male  Chief Complaint  Patient presents with  . Annual Exam  Lump on left lower leg has resolved.  Felt to be a bruise.  No further workup planned. Patient all in all doing well with pacemaker good control has that checked on a regular basis. Diabetes no low blood sugar spells or issues locations good control. BPH doing well as is cholesterol.  Patient is concerned may be some arthritis complaints may be coming from simvastatin.   Relevant past medical, surgical, family and social history reviewed and updated as indicated. Interim medical history since our last visit reviewed. Allergies and medications reviewed and updated.  Review of Systems  Constitutional: Negative.   HENT: Negative.   Eyes: Negative.   Respiratory: Negative.   Cardiovascular: Negative.   Gastrointestinal: Negative.   Endocrine: Negative.   Genitourinary: Negative.   Musculoskeletal: Negative.   Skin: Negative.   Allergic/Immunologic: Negative.   Neurological: Negative.   Hematological: Negative.   Psychiatric/Behavioral: Negative.     Per HPI unless specifically indicated above     Objective:    BP (!) 146/76   Pulse (!) 108   Wt 194 lb (88 kg)   SpO2 98%   BMI 28.65 kg/m   Wt Readings from Last 3 Encounters:  10/01/17 194 lb (88 kg)  09/13/17 189 lb (85.7 kg)  08/30/17 192 lb (87.1 kg)    Physical Exam  Constitutional: He is oriented to person, place, and time. He appears well-developed and well-nourished.  HENT:  Head: Normocephalic and atraumatic.  Right Ear: External ear normal.  Left Ear: External ear normal.  Eyes: Conjunctivae and EOM are normal. Pupils are equal, round, and reactive to light.  Neck: Normal range of motion. Neck supple.  Cardiovascular: Normal  rate, regular rhythm, normal heart sounds and intact distal pulses.  Pulmonary/Chest: Effort normal and breath sounds normal.  Abdominal: Soft. Bowel sounds are normal. There is no splenomegaly or hepatomegaly.  Genitourinary: Rectum normal and penis normal.  Genitourinary Comments: BPH changes  Musculoskeletal: Normal range of motion.  Neurological: He is alert and oriented to person, place, and time. He has normal reflexes.  Skin: No rash noted. No erythema.  Multiple actinic keratosis on face  Pressure sore superficial right buttocks area  Psychiatric: He has a normal mood and affect. His behavior is normal. Judgment and thought content normal.    Results for orders placed or performed in visit on 07/31/17  Bayer DCA Hb A1c Waived  Result Value Ref Range   Bayer DCA Hb A1c Waived 5.8 <7.0 %      Assessment & Plan:   Problem List Items Addressed This Visit      Cardiovascular and Mediastinum   Hypertension - Primary    The current medical regimen is effective;  continue present plan and medications.       Relevant Medications   enalapril (VASOTEC) 5 MG tablet   metoprolol tartrate (LOPRESSOR) 25 MG tablet   simvastatin (ZOCOR) 20 MG tablet   Other Relevant Orders   CBC with Differential/Platelet   Comprehensive metabolic panel   Lipid panel   Urinalysis, Routine w reflex microscopic   Sick sinus  syndrome (HCC)    Good control doing well      Relevant Medications   enalapril (VASOTEC) 5 MG tablet   metoprolol tartrate (LOPRESSOR) 25 MG tablet   simvastatin (ZOCOR) 20 MG tablet     Endocrine   Type II diabetes mellitus with neurological manifestations (HCC)    The current medical regimen is effective;  continue present plan and medications.       Relevant Medications   enalapril (VASOTEC) 5 MG tablet   gabapentin (NEURONTIN) 300 MG capsule   GLIPIZIDE XL 2.5 MG 24 hr tablet   simvastatin (ZOCOR) 20 MG tablet   Other Relevant Orders   CBC with  Differential/Platelet   Comprehensive metabolic panel   Lipid panel   Urinalysis, Routine w reflex microscopic     Musculoskeletal and Integument   Arthritis of ankle, left   Relevant Medications   gabapentin (NEURONTIN) 300 MG capsule   Pressure sore on buttocks    Discussed pressure sore care and treatment especially staying off of it will observe if not getting better will need to recheck.        Genitourinary   BPH with obstruction/lower urinary tract symptoms    The current medical regimen is effective;  continue present plan and medications.       Relevant Medications   tamsulosin (FLOMAX) 0.4 MG CAPS capsule     Other   Hyperlipidemia    Discussed cholesterol and myalgias arthralgias patient will do a trial of stopping restarting will notify us if any change off and on simvastatin.  Will stop for 2 weeks.      Relevant Medications   enalapril (VASOTEC) 5 MG tablet   metoprolol tartrate (LOPRESSOR) 25 MG tablet   simvastatin (ZOCOR) 20 MG tablet   Other Relevant Orders   CBC with Differential/Platelet   Comprehensive metabolic panel   Lipid panel   Urinalysis, Routine w reflex microscopic   Chronic hoarseness    Stable followed by ear nose and throat      Advanced care planning/counseling discussion    A voluntary discussion about advance care planning including the explanation and discussion of advance directives was extensively discussed  with the patient.  Explanation about the health care proxy and Living will was reviewed and packet with forms with explanation of how to fill them out was given.         Other Visit Diagnoses    Thyroid disorder screen       Relevant Orders   TSH   PE (physical exam), annual           Follow up plan: Return in about 5 months (around 03/01/2018), or if symptoms worsen or fail to improve, for Hemoglobin A1c, BMP,  Lipids, ALT, AST.

## 2017-10-01 NOTE — Assessment & Plan Note (Signed)
Discussed pressure sore care and treatment especially staying off of it will observe if not getting better will need to recheck.

## 2017-10-02 ENCOUNTER — Encounter: Payer: Self-pay | Admitting: Family Medicine

## 2017-10-02 LAB — LIPID PANEL
CHOL/HDL RATIO: 2.5 ratio (ref 0.0–5.0)
Cholesterol, Total: 118 mg/dL (ref 100–199)
HDL: 48 mg/dL (ref >39)
LDL Calculated: 60 mg/dL (ref 0–99)
Triglycerides: 51 mg/dL (ref 0–149)
VLDL Cholesterol Cal: 10 mg/dL (ref 5–40)

## 2017-10-02 LAB — COMPREHENSIVE METABOLIC PANEL
A/G RATIO: 1.3 (ref 1.2–2.2)
ALT: 13 IU/L (ref 0–44)
AST: 15 IU/L (ref 0–40)
Albumin: 3.6 g/dL (ref 3.5–4.7)
Alkaline Phosphatase: 65 IU/L (ref 39–117)
BUN/Creatinine Ratio: 18 (ref 10–24)
BUN: 17 mg/dL (ref 8–27)
Bilirubin Total: 1 mg/dL (ref 0.0–1.2)
CALCIUM: 8.7 mg/dL (ref 8.6–10.2)
CO2: 27 mmol/L (ref 20–29)
Chloride: 105 mmol/L (ref 96–106)
Creatinine, Ser: 0.92 mg/dL (ref 0.76–1.27)
GFR, EST AFRICAN AMERICAN: 86 mL/min/{1.73_m2} (ref >59)
GFR, EST NON AFRICAN AMERICAN: 74 mL/min/{1.73_m2} (ref >59)
GLOBULIN, TOTAL: 2.7 g/dL (ref 1.5–4.5)
Glucose: 83 mg/dL (ref 65–99)
POTASSIUM: 4.4 mmol/L (ref 3.5–5.2)
SODIUM: 145 mmol/L — AB (ref 134–144)
TOTAL PROTEIN: 6.3 g/dL (ref 6.0–8.5)

## 2017-10-02 LAB — CBC WITH DIFFERENTIAL/PLATELET
BASOS: 1 %
Basophils Absolute: 0 10*3/uL (ref 0.0–0.2)
EOS (ABSOLUTE): 0.4 10*3/uL (ref 0.0–0.4)
EOS: 7 %
HEMATOCRIT: 36.4 % — AB (ref 37.5–51.0)
Hemoglobin: 12 g/dL — ABNORMAL LOW (ref 13.0–17.7)
IMMATURE GRANS (ABS): 0 10*3/uL (ref 0.0–0.1)
IMMATURE GRANULOCYTES: 0 %
LYMPHS: 26 %
Lymphocytes Absolute: 1.7 10*3/uL (ref 0.7–3.1)
MCH: 30.6 pg (ref 26.6–33.0)
MCHC: 33 g/dL (ref 31.5–35.7)
MCV: 93 fL (ref 79–97)
Monocytes Absolute: 0.5 10*3/uL (ref 0.1–0.9)
Monocytes: 7 %
NEUTROS PCT: 59 %
Neutrophils Absolute: 3.9 10*3/uL (ref 1.4–7.0)
Platelets: 198 10*3/uL (ref 150–379)
RBC: 3.92 x10E6/uL — ABNORMAL LOW (ref 4.14–5.80)
RDW: 14.4 % (ref 12.3–15.4)
WBC: 6.5 10*3/uL (ref 3.4–10.8)

## 2017-10-02 LAB — TSH: TSH: 1.91 u[IU]/mL (ref 0.450–4.500)

## 2017-11-07 ENCOUNTER — Encounter: Payer: Self-pay | Admitting: Family Medicine

## 2018-01-01 ENCOUNTER — Other Ambulatory Visit: Payer: Self-pay | Admitting: Ophthalmology

## 2018-01-01 DIAGNOSIS — G453 Amaurosis fugax: Secondary | ICD-10-CM

## 2018-01-01 LAB — HM DIABETES EYE EXAM

## 2018-01-07 ENCOUNTER — Ambulatory Visit
Admission: RE | Admit: 2018-01-07 | Discharge: 2018-01-07 | Disposition: A | Discharge status: Home / Self Care | Payer: Medicare Other | Source: Ambulatory Visit | Attending: Ophthalmology | Admitting: Ophthalmology

## 2018-01-07 DIAGNOSIS — G453 Amaurosis fugax: Secondary | ICD-10-CM | POA: Insufficient documentation

## 2018-01-15 ENCOUNTER — Other Ambulatory Visit: Payer: Self-pay

## 2018-01-15 DIAGNOSIS — I6521 Occlusion and stenosis of right carotid artery: Secondary | ICD-10-CM

## 2018-01-16 ENCOUNTER — Other Ambulatory Visit: Payer: Self-pay | Admitting: *Deleted

## 2018-01-16 ENCOUNTER — Ambulatory Visit (HOSPITAL_COMMUNITY)
Admission: RE | Admit: 2018-01-16 | Discharge: 2018-01-16 | Disposition: A | Discharge status: Home / Self Care | Payer: Medicare Other | Source: Ambulatory Visit | Attending: Vascular Surgery | Admitting: Vascular Surgery

## 2018-01-16 ENCOUNTER — Encounter: Payer: Self-pay | Admitting: Vascular Surgery

## 2018-01-16 ENCOUNTER — Other Ambulatory Visit: Payer: Self-pay

## 2018-01-16 ENCOUNTER — Encounter: Payer: Self-pay | Admitting: *Deleted

## 2018-01-16 ENCOUNTER — Ambulatory Visit (INDEPENDENT_AMBULATORY_CARE_PROVIDER_SITE_OTHER): Payer: Medicare Other | Admitting: Vascular Surgery

## 2018-01-16 VITALS — BP 163/74 | HR 67 | Temp 97.1°F | Resp 20 | Ht 69.0 in | Wt 187.0 lb

## 2018-01-16 DIAGNOSIS — I6521 Occlusion and stenosis of right carotid artery: Secondary | ICD-10-CM

## 2018-01-16 NOTE — Progress Notes (Signed)
[facility]  Patient name: Jeffery Horne MRN: [id number] DOB: 06/21/1927 Sex: male  REASON FOR CONSULT: Symptomatic right carotid stenosis  HPI: Jeffery Horne is a 84 y.o. male, who is here today with his daughter for discussion of symptomatic right carotid stenosis.  He has had 3 separate instances in the last 4 to 6 weeks where he has had visual changes in his right eye specifically.  He did see his eye doctor who desiccated this further with carotid duplex showing critical stenosis in his right carotid artery.  He is here today for discussion of this.  He is right-handed.  He has not had any other focal deficits.  Specifically has not had any left-sided weakness.  He does have some chronic dizziness and has been told by his ear nose and throat physician that this is related to inner ear crystals.  He does have a history of prior pacemaker placed several years ago but does not have any other cardiac disease.  He is relatively independent.  He does live with his son and has 2 daughters nearby to help care for him as well.  Past Medical History:  Diagnosis Date  . BPH (benign prostatic hypertrophy) with urinary obstruction   . Hearing impairment   . Hyperlipidemia   . Hypertension   . Peripheral vascular disease (HCC)   . Primary gout   . Type II diabetes mellitus with neurological manifestations (HCC)     Family History  Problem Relation Age of Onset  . Diabetes Mother   . Hypertension Mother   . Stroke Mother   . Hypertension Father     SOCIAL HISTORY: Social History   Socioeconomic History  . Marital status: Widowed    Spouse name: Not on file  . Number of children: Not on file  . Years of education: Not on file  . Highest education level: GED or equivalent  Occupational History  . Not on file  Social Needs  . Financial resource strain: Not hard at all  . Food insecurity:    Worry: Never true   Inability: Never true  . Transportation needs:    Medical: No    Non-medical: No  Tobacco Use  . Smoking status: Former Smoker    Types: Cigarettes    Last attempt to quit: 09/11/1971    Years since quitting: 46.3  . Smokeless tobacco: Never Used  Substance and Sexual Activity  . Alcohol use: No  . Drug use: No  . Sexual activity: Not on file  Lifestyle  . Physical activity:    Days per week: 0 days    Minutes per session: 0 min  . Stress: Not at all  Relationships  . Social connections:    Talks on phone: Once a week    Gets together: More than three times a week    Attends religious service: More than 4 times per year    Active member of club or organization: No    Attends meetings of clubs or organizations: Never    Relationship status: Widowed  . Intimate partner violence:    Fear of current or ex partner: No    Emotionally abused: No    Physically abused: No    Forced sexual activity: No  Other Topics Concern  . Not on file  Social History Narrative  . Not on file    No Known Allergies  Current Outpatient Medications  Medication Sig Dispense Refill  . clopidogrel (  PLAVIX) 75 MG tablet Take 75 mg by mouth daily.  7  . enalapril (VASOTEC) 5 MG tablet Take 1 tablet (5 mg total) by mouth daily. 45 tablet 4  . gabapentin (NEURONTIN) 300 MG capsule Take 8 capsules (2,400 mg total) by mouth daily. 720 capsule 4  . GLIPIZIDE XL 2.5 MG 24 hr tablet Take 1 tablet (2.5 mg total) by mouth daily. 90 tablet 4  . metoprolol tartrate (LOPRESSOR) 25 MG tablet Take 1 tablet (25 mg total) by mouth daily. 90 tablet 4  . simvastatin (ZOCOR) 20 MG tablet Take 1 tablet (20 mg total) by mouth daily. 90 tablet 4  . tamsulosin (FLOMAX) 0.4 MG CAPS capsule Take 1 capsule (0.4 mg total) by mouth daily. 90 capsule 4  . aspirin EC 81 MG tablet Take 81 mg by mouth daily.    . fluticasone (FLONASE) 50 MCG/ACT nasal spray Place 2 sprays into both nostrils daily. 16 g 12  . glucose blood test  strip Use as instructed (Patient not taking: Reported on 01/16/2018) 100 each 12   No current facility-administered medications for this visit.     REVIEW OF SYSTEMS:   denotes positive finding,  denotes negative finding Cardiac  Comments:  Chest pain or chest pressure:    Shortness of breath upon exertion:    Short of breath when lying flat:    Irregular heart rhythm:        Vascular    Pain in calf, thigh, or hip brought on by ambulation:    Pain in feet at night that wakes you up from your sleep:     Blood clot in your veins: x   Leg swelling:  x       Pulmonary    Oxygen at home:    Productive cough:  x   Wheezing:         Neurologic    Sudden weakness in arms or legs:  x   Sudden numbness in arms or legs:     Sudden onset of difficulty speaking or slurred speech:    Temporary loss of vision in one eye:  x   Problems with dizziness:  x       Gastrointestinal    Blood in stool:     Vomited blood:         Genitourinary    Burning when urinating:     Blood in urine:        Psychiatric    Major depression:         Hematologic    Bleeding problems:    Problems with blood clotting too easily:        Skin    Rashes or ulcers:        Constitutional    Fever or chills:      PHYSICAL EXAM: Vitals:   01/16/18 1015 01/16/18 1019  BP: (!) 172/78 (!) 163/74  Pulse: 67   Resp: 20   Temp: (!) 97.1 F (36.2 C)   TempSrc: Oral   SpO2: 95%   Weight: 187 lb (84.8 kg)   Height:  (1.753 m)     GENERAL: The patient is a well-nourished male, in no acute distress. The vital signs are documented above. CARDIOVASCULAR: Harsh high-pitched right carotid bruit.  No bruit on the left.  2+ radial and 2+ popliteal pulses bilaterally PULMONARY: There is good air exchange  ABDOMEN: Soft and non-tender  MUSCULOSKELETAL: There are no major deformities or cyanosis. NEUROLOGIC: No focal  weakness or paresthesias are detected. SKIN: There are no ulcers or rashes  noted. PSYCHIATRIC: The patient has a normal affect.  DATA:  He underwent confirmatory duplex in our office today and this does show greater than 80% right carotid stenosis.  [facility] duplex [facility] showed no significant stenosis on the left.  MEDICAL ISSUES: Had a long discussion with the patient and his daughter present.  Explained that most likely source for his right eye visual changes was embolus from his carotid artery.  I explained this puts him at significant risk for either worsening vision changes or right brain stroke.  I have recommended right carotid endarterectomy for reduction of stroke risk.  He has been placed on Plavix due to the high-grade stenosis and I would recommend continuing this through the time of surgery.  Patient wishes to proceed as soon as possible and we will schedule surgery at his earliest convenience.  I did explain the potential risk for surgery including 1 to 2% risk of stroke with surgery.   Larina Earthly, MD FACS [facility] of [facility] Tel [phone] Pager [phone]

## 2018-01-17 ENCOUNTER — Telehealth: Payer: Self-pay | Admitting: *Deleted

## 2018-01-17 NOTE — Telephone Encounter (Signed)
Call from patient's daughter(MARSHA) that the family and patient has decided to cancel surgery "and give medication more time to help". Instructed her that they are to call this office for any further action. Appt or surgery. Verbalized understanding.

## 2018-01-22 ENCOUNTER — Inpatient Hospital Stay (HOSPITAL_COMMUNITY): Admission: RE | Admit: 2018-01-22 | Payer: Medicare Other | Source: Ambulatory Visit | Admitting: Vascular Surgery

## 2018-01-22 ENCOUNTER — Encounter (HOSPITAL_COMMUNITY): Admission: RE | Payer: Self-pay | Source: Ambulatory Visit

## 2018-01-22 SURGERY — ENDARTERECTOMY, CAROTID
Anesthesia: General | Laterality: Right

## 2018-03-03 ENCOUNTER — Ambulatory Visit: Payer: Medicare Other | Admitting: Family Medicine

## 2018-03-18 ENCOUNTER — Ambulatory Visit: Payer: Medicare Other | Admitting: Family Medicine

## 2018-03-20 ENCOUNTER — Encounter: Payer: Self-pay | Admitting: Family Medicine

## 2018-03-20 ENCOUNTER — Ambulatory Visit (INDEPENDENT_AMBULATORY_CARE_PROVIDER_SITE_OTHER): Payer: Medicare Other | Admitting: Family Medicine

## 2018-03-20 VITALS — BP 120/70 | HR 85 | Temp 97.9°F | Resp 17 | Wt 182.1 lb

## 2018-03-20 DIAGNOSIS — I495 Sick sinus syndrome: Secondary | ICD-10-CM | POA: Diagnosis not present

## 2018-03-20 DIAGNOSIS — E1149 Type 2 diabetes mellitus with other diabetic neurological complication: Secondary | ICD-10-CM | POA: Diagnosis not present

## 2018-03-20 DIAGNOSIS — G453 Amaurosis fugax: Secondary | ICD-10-CM | POA: Insufficient documentation

## 2018-03-20 DIAGNOSIS — E785 Hyperlipidemia, unspecified: Secondary | ICD-10-CM | POA: Diagnosis not present

## 2018-03-20 DIAGNOSIS — I10 Essential (primary) hypertension: Secondary | ICD-10-CM

## 2018-03-20 DIAGNOSIS — I6521 Occlusion and stenosis of right carotid artery: Secondary | ICD-10-CM

## 2018-03-20 LAB — LP+ALT+AST PICCOLO, WAIVED
ALT (SGPT) PICCOLO, WAIVED: 22 U/L (ref 10–47)
AST (SGOT) Piccolo, Waived: 22 U/L (ref 11–38)
CHOLESTEROL PICCOLO, WAIVED: 124 mg/dL (ref <200)
Chol/HDL Ratio Piccolo,Waive: 2.7 mg/dL
HDL CHOL PICCOLO, WAIVED: 46 mg/dL — AB (ref >59)
LDL CHOL CALC PICCOLO WAIVED: 64 mg/dL (ref <100)
Triglycerides Piccolo,Waived: 73 mg/dL (ref <150)
VLDL Chol Calc Piccolo,Waive: 15 mg/dL (ref <30)

## 2018-03-20 LAB — BAYER DCA HB A1C WAIVED: HB A1C: 5.5 % (ref <7.0)

## 2018-03-20 NOTE — Assessment & Plan Note (Signed)
Doing well with diet control

## 2018-03-20 NOTE — Assessment & Plan Note (Signed)
Pacemaker followed by cardiology

## 2018-03-20 NOTE — Assessment & Plan Note (Signed)
Is followed by Dr. Arbie CookeyEarly of vascular surgery

## 2018-03-20 NOTE — Assessment & Plan Note (Signed)
LDL is low in good control on simvastatin

## 2018-03-20 NOTE — Assessment & Plan Note (Signed)
Carotid stenosis

## 2018-03-20 NOTE — Progress Notes (Signed)
BP 120/70 (BP Location: Left Arm, Patient Position: Sitting)   Pulse 85   Temp 97.9 F (36.6 C) (Temporal)   Resp 17   Wt 182 lb 1.6 oz (82.6 kg)   SpO2 97%   BMI 26.89 kg/m    Subjective:    Patient ID: Jeffery Horne, male    DOB: June 02, 1927, 84 y.o.   MRN: [id number]  HPI: Jeffery GreenlandLeonard K Zaccaro is a 84 y.o. male  Chief Complaint  Patient presents with  . Follow-up  . Hypertension  . Hyperlipidemia  . Diabetes  Patient all in all doing well had amaurosis fugax symptoms of his right eye with subsequent diagnosis of right carotid artery stenosis 95%.  Has been to vascular surgery and has been recommended carotid artery surgery.  Patient is been placed on Plavix without problems or issues. Blood pressure doing well no complaints Cholesterol doing well no complaints taking medications faithfully diabetes no low blood sugar spells and doing well.  Relevant past medical, surgical, family and social history reviewed and updated as indicated. Interim medical history since our last visit reviewed. Allergies and medications reviewed and updated.  Review of Systems  Constitutional: Negative.   Respiratory: Negative.   Cardiovascular: Negative.     Per HPI unless specifically indicated above     Objective:    BP 120/70 (BP Location: Left Arm, Patient Position: Sitting)   Pulse 85   Temp 97.9 F (36.6 C) (Temporal)   Resp 17   Wt 182 lb 1.6 oz (82.6 kg)   SpO2 97%   BMI 26.89 kg/m   Wt Readings from Last 3 Encounters:  03/20/18 182 lb 1.6 oz (82.6 kg)  01/16/18 187 lb (84.8 kg)  10/01/17 194 lb (88 kg)    Physical Exam  Constitutional: He is oriented to person, place, and time. He appears well-developed and well-nourished.  HENT:  Head: Normocephalic and atraumatic.  Eyes: Conjunctivae and EOM are normal.  Neck: Normal range of motion.  Cardiovascular: Normal rate, regular rhythm and normal heart sounds.  Pulmonary/Chest: Effort normal and breath sounds normal.    Musculoskeletal: Normal range of motion.  Neurological: He is alert and oriented to person, place, and time.  Skin: No erythema.  Psychiatric: He has a normal mood and affect. His behavior is normal. Judgment and thought content normal.    Results for orders placed or performed in visit on 01/03/18  HM DIABETES EYE EXAM  Result Value Ref Range   HM Diabetic Eye Exam No Retinopathy No Retinopathy      Assessment & Plan:   Problem List Items Addressed This Visit      Cardiovascular and Mediastinum   Hypertension - Primary   Relevant Orders   Basic metabolic panel   Bayer DCA Hb W0JA1c Waived   LP+ALT+AST Piccolo, Waived   Sick sinus syndrome [facility](HCC)    Pacemaker followed by cardiology      Carotid artery stenosis, symptomatic, right    Is followed by Dr. Arbie CookeyEarly of vascular surgery      Amaurosis fugax of right eye    Carotid stenosis        Endocrine   Type II diabetes mellitus with neurological manifestations (HCC)    Doing well with diet control      Relevant Orders   Basic metabolic panel   Bayer DCA Hb W1XA1c Waived   LP+ALT+AST Piccolo, Waived     Other   Hyperlipidemia    LDL is low in good control on  simvastatin      Relevant Orders   Basic metabolic panel   Bayer DCA Hb W0J Waived   LP+ALT+AST Piccolo, Waived       Follow up plan: Return in about 6 months (around 09/20/2018) for Physical Exam, Hemoglobin A1c.

## 2018-03-21 LAB — BASIC METABOLIC PANEL
BUN / CREAT RATIO: 13 (ref 10–24)
BUN: 13 mg/dL (ref 8–27)
CO2: 26 mmol/L (ref 20–29)
CREATININE: 0.97 mg/dL (ref 0.76–1.27)
Calcium: 9.1 mg/dL (ref 8.6–10.2)
Chloride: 102 mmol/L (ref 96–106)
GFR calc non Af Amer: 69 mL/min/{1.73_m2} (ref >59)
GFR, EST AFRICAN AMERICAN: 80 mL/min/{1.73_m2} (ref >59)
Glucose: 102 mg/dL — ABNORMAL HIGH (ref 65–99)
Potassium: 4.6 mmol/L (ref 3.5–5.2)
Sodium: 141 mmol/L (ref 134–144)

## 2018-03-24 ENCOUNTER — Encounter: Payer: Self-pay | Admitting: Family Medicine

## 2018-03-27 ENCOUNTER — Ambulatory Visit
Admission: RE | Admit: 2018-03-27 | Discharge: 2018-03-27 | Disposition: A | Discharge status: Home / Self Care | Payer: Medicare Other | Source: Ambulatory Visit | Attending: Cardiology | Admitting: Cardiology

## 2018-03-27 ENCOUNTER — Other Ambulatory Visit: Payer: Self-pay | Admitting: Cardiology

## 2018-03-27 ENCOUNTER — Ambulatory Visit
Admission: RE | Admit: 2018-03-27 | Discharge: 2018-03-27 | Disposition: A | Discharge status: Home / Self Care | Payer: Medicare Other | Source: Ambulatory Visit | Attending: Internal Medicine | Admitting: Internal Medicine

## 2018-03-27 DIAGNOSIS — I700 Atherosclerosis of aorta: Secondary | ICD-10-CM | POA: Insufficient documentation

## 2018-03-27 DIAGNOSIS — R042 Hemoptysis: Secondary | ICD-10-CM

## 2018-04-10 ENCOUNTER — Other Ambulatory Visit: Payer: Self-pay | Admitting: Otolaryngology

## 2018-04-10 DIAGNOSIS — M542 Cervicalgia: Secondary | ICD-10-CM

## 2018-04-16 ENCOUNTER — Ambulatory Visit
Admission: RE | Admit: 2018-04-16 | Discharge: 2018-04-16 | Disposition: A | Discharge status: Home / Self Care | Payer: Medicare Other | Source: Ambulatory Visit | Attending: Otolaryngology | Admitting: Otolaryngology

## 2018-04-16 DIAGNOSIS — M4812 Ankylosing hyperostosis [Forestier], cervical region: Secondary | ICD-10-CM | POA: Diagnosis not present

## 2018-04-16 DIAGNOSIS — I6521 Occlusion and stenosis of right carotid artery: Secondary | ICD-10-CM | POA: Insufficient documentation

## 2018-04-16 DIAGNOSIS — J330 Polyp of nasal cavity: Secondary | ICD-10-CM | POA: Diagnosis not present

## 2018-04-16 DIAGNOSIS — J439 Emphysema, unspecified: Secondary | ICD-10-CM | POA: Diagnosis not present

## 2018-04-16 DIAGNOSIS — M542 Cervicalgia: Secondary | ICD-10-CM

## 2018-04-16 MED ORDER — IOPAMIDOL (ISOVUE-300) INJECTION 61%
100.0000 mL | Freq: Once | INTRAVENOUS | Status: AC | PRN
  Administered 2018-04-16: 75 mL via INTRAVENOUS

## 2018-09-29 ENCOUNTER — Ambulatory Visit: Payer: Self-pay

## 2018-10-06 ENCOUNTER — Ambulatory Visit (INDEPENDENT_AMBULATORY_CARE_PROVIDER_SITE_OTHER): Payer: Medicare Other | Admitting: Family Medicine

## 2018-10-06 ENCOUNTER — Encounter: Payer: Self-pay | Admitting: Family Medicine

## 2018-10-06 DIAGNOSIS — M19072 Primary osteoarthritis, left ankle and foot: Secondary | ICD-10-CM | POA: Diagnosis not present

## 2018-10-06 DIAGNOSIS — N138 Other obstructive and reflux uropathy: Secondary | ICD-10-CM

## 2018-10-06 DIAGNOSIS — E7800 Pure hypercholesterolemia, unspecified: Secondary | ICD-10-CM | POA: Diagnosis not present

## 2018-10-06 DIAGNOSIS — N401 Enlarged prostate with lower urinary tract symptoms: Secondary | ICD-10-CM | POA: Diagnosis not present

## 2018-10-06 DIAGNOSIS — E1149 Type 2 diabetes mellitus with other diabetic neurological complication: Secondary | ICD-10-CM

## 2018-10-06 DIAGNOSIS — Z7189 Other specified counseling: Secondary | ICD-10-CM

## 2018-10-06 DIAGNOSIS — I10 Essential (primary) hypertension: Secondary | ICD-10-CM

## 2018-10-06 DIAGNOSIS — I495 Sick sinus syndrome: Secondary | ICD-10-CM

## 2018-10-06 LAB — MICROSCOPIC EXAMINATION
Bacteria, UA: NONE SEEN
WBC, UA: NONE SEEN /hpf (ref 0–5)

## 2018-10-06 LAB — URINALYSIS, ROUTINE W REFLEX MICROSCOPIC
Bilirubin, UA: NEGATIVE
Glucose, UA: NEGATIVE
Ketones, UA: NEGATIVE
Leukocytes, UA: NEGATIVE
Nitrite, UA: NEGATIVE
Protein, UA: NEGATIVE
Specific Gravity, UA: 1.025 (ref 1.005–1.030)
Urobilinogen, Ur: 1 mg/dL (ref 0.2–1.0)
pH, UA: 6.5 (ref 5.0–7.5)

## 2018-10-06 LAB — BAYER DCA HB A1C WAIVED: HB A1C (BAYER DCA - WAIVED): 5.4 % (ref <7.0)

## 2018-10-06 MED ORDER — SIMVASTATIN 20 MG PO TABS: 20.0000 mg | ORAL_TABLET | Freq: Every day | ORAL | 4 refills | Status: DC

## 2018-10-06 MED ORDER — ENALAPRIL MALEATE 5 MG PO TABS: 5.0000 mg | ORAL_TABLET | Freq: Every day | ORAL | 4 refills | Status: DC

## 2018-10-06 MED ORDER — GABAPENTIN 300 MG PO CAPS: 2400.0000 mg | ORAL_CAPSULE | Freq: Every day | ORAL | 4 refills | Status: DC

## 2018-10-06 MED ORDER — GLIPIZIDE XL 2.5 MG PO TB24: 2.5000 mg | ORAL_TABLET | Freq: Every day | ORAL | 4 refills | Status: DC

## 2018-10-06 MED ORDER — TAMSULOSIN HCL 0.4 MG PO CAPS: 0.4000 mg | ORAL_CAPSULE | Freq: Every day | ORAL | 4 refills | Status: DC

## 2018-10-06 MED ORDER — METOPROLOL TARTRATE 25 MG PO TABS: 25.0000 mg | ORAL_TABLET | Freq: Every day | ORAL | 4 refills | Status: DC

## 2018-10-06 NOTE — Progress Notes (Signed)
Depression screen [facility] 2/9 10/06/2018 10/01/2017 09/13/2017  Decreased Interest 0 0 0  Down, Depressed, Hopeless 0 0 0  PHQ - 2 Score 0 0 0  Altered sleeping 0 - -  Tired, decreased energy 0 - -  Change in appetite 0 - -  Feeling bad or failure about yourself  0 - -  Trouble concentrating 0 - -  Moving slowly or fidgety/restless 0 - -  Suicidal thoughts 0 - -  PHQ-9 Score 0 - -   Functional Status Survey: Is the patient deaf or have difficulty hearing?: Yes(Patient has hearing aids) Does the patient have difficulty seeing, even when wearing glasses/contacts?: No Does the patient have difficulty concentrating, remembering, or making decisions?: No Does the patient have difficulty walking or climbing stairs?: No Does the patient have difficulty dressing or bathing?: No Does the patient have difficulty doing errands alone such as visiting a doctor's office or shopping?: No   Fall Risk  10/06/2018 10/01/2017 09/13/2017 08/30/2017 07/31/2017  Falls in the past year? 1 No No No No  Number falls in past yr: 0 - - - -  Injury with Fall? 0 - - - -  Risk for fall due to : Impaired balance/gait - - - -  Follow up Falls evaluation completed - - - -

## 2018-10-06 NOTE — Assessment & Plan Note (Signed)
The current medical regimen is effective;  continue present plan and medications.  

## 2018-10-06 NOTE — Progress Notes (Signed)
BP (!) 151/72 (BP Location: Left Arm, Patient Position: Sitting, Cuff Size: Normal)   Pulse 100   Temp 97.7 F (36.5 C) (Oral)   Ht 5\' 7"  (1.702 m)   Wt 186 lb 1.6 oz (84.4 kg)   SpO2 95%   BMI 29.15 kg/m    Subjective:    Patient ID: Jeffery Horne, male    DOB: [birth year], 85 y.o.   MRN: [id number]  HPI: Jeffery Horne is a 85 y.o. male  Chief Complaint  Patient presents with  . Annual Exam  Patient follow-up all in all doing well no complaints will be turning 89 later this spring. Wondering about a handicap sticker because of his ability to walk will go ahead with handicap sticker. Patient with some nonspecific drooling when he talks sometimes reviewed will discuss with dentist and dentures.  Also will just manually swallow during talking. We will make sure prescriptions are 90-day prescriptions. Blood pressure no issues no complaints. Simvastatin the same no complaints takes faithfully. Diabetes no complaints no low blood sugar spells. Urinary flows doing well without problems. Neuropathy in his legs is stable with gabapentin.  Relevant past medical, surgical, family and social history reviewed and updated as indicated. Interim medical history since our last visit reviewed. Allergies and medications reviewed and updated.  Review of Systems  Constitutional: Negative.   HENT: Negative.   Eyes: Negative.   Respiratory: Negative.   Cardiovascular: Negative.   Gastrointestinal: Negative.   Endocrine: Negative.   Genitourinary: Negative.   Musculoskeletal: Negative.   Skin: Negative.   Allergic/Immunologic: Negative.   Neurological: Negative.   Hematological: Negative.   Psychiatric/Behavioral: Negative.     Per HPI unless specifically indicated above     Objective:    BP (!) 151/72 (BP Location: Left Arm, Patient Position: Sitting, Cuff Size: Normal)   Pulse 100   Temp 97.7 F (36.5 C) (Oral)   Ht 5\' 7"  (1.702 m)   Wt 186 lb 1.6 oz (84.4 kg)   SpO2  95%   BMI 29.15 kg/m   Wt Readings from Last 3 Encounters:  10/06/18 186 lb 1.6 oz (84.4 kg)  03/20/18 182 lb 1.6 oz (82.6 kg)  01/16/18 187 lb (84.8 kg)    Physical Exam Constitutional:      Appearance: He is well-developed.  HENT:     Head: Normocephalic.     Right Ear: External ear normal.     Left Ear: External ear normal.     Nose: Nose normal.  Eyes:     Conjunctiva/sclera: Conjunctivae normal.     Pupils: Pupils are equal, round, and reactive to light.  Neck:     Musculoskeletal: Normal range of motion and neck supple.     Thyroid: No thyromegaly.  Cardiovascular:     Rate and Rhythm: Normal rate and regular rhythm.     Heart sounds: Normal heart sounds.  Pulmonary:     Effort: Pulmonary effort is normal.     Breath sounds: Normal breath sounds.  Abdominal:     General: Bowel sounds are normal.     Palpations: Abdomen is soft. There is no hepatomegaly or splenomegaly.  Genitourinary:    Penis: Normal.      Comments: BPH stable Musculoskeletal: Normal range of motion.     Comments: Slow gate  Lymphadenopathy:     Cervical: No cervical adenopathy.  Skin:    General: Skin is warm and dry.  Neurological:     Mental Status: He is  alert and oriented to person, place, and time.     Deep Tendon Reflexes: Reflexes are normal and symmetric.  Psychiatric:        Behavior: Behavior normal.        Thought Content: Thought content normal.        Judgment: Judgment normal.     Results for orders placed or performed in visit on 03/20/18  Basic metabolic panel  Result Value Ref Range   Glucose 102 (H) 65 - 99 mg/dL   BUN 13 8 - 27 mg/dL   Creatinine, Ser 6.01 0.76 - 1.27 mg/dL   GFR calc non Af Amer 69 >59 mL/min/1.73   GFR calc Af Amer 80 >59 mL/min/1.73   BUN/Creatinine Ratio 13 10 - 24   Sodium 141 134 - 144 mmol/L   Potassium 4.6 3.5 - 5.2 mmol/L   Chloride 102 96 - 106 mmol/L   CO2 26 20 - 29 mmol/L   Calcium 9.1 8.6 - 10.2 mg/dL  Bayer DCA Hb V6F Waived    Result Value Ref Range   HB A1C (BAYER DCA - WAIVED) 5.5 <7.0 %  LP+ALT+AST Piccolo, Waived  Result Value Ref Range   ALT (SGPT) Piccolo, Waived 22 10 - 47 U/L   AST (SGOT) Piccolo, Waived 22 11 - 38 U/L   Cholesterol Piccolo, Waived 124 <200 mg/dL   HDL Chol Piccolo, Waived 46 (L) >59 mg/dL   Triglycerides Piccolo,Waived 73 <150 mg/dL   Chol/HDL Ratio Piccolo,Waive 2.7 mg/dL   LDL Chol Calc Piccolo Waived 64 <100 mg/dL   VLDL Chol Calc Piccolo,Waive 15 <30 mg/dL      Assessment & Plan:   Problem List Items Addressed This Visit      Cardiovascular and Mediastinum   Hypertension    The current medical regimen is effective;  continue present plan and medications.       Relevant Medications   simvastatin (ZOCOR) 20 MG tablet   metoprolol tartrate (LOPRESSOR) 25 MG tablet   enalapril (VASOTEC) 5 MG tablet   Other Relevant Orders   Comprehensive metabolic panel   CBC with Differential/Platelet   TSH   Urinalysis, Routine w reflex microscopic   Sick sinus syndrome (HCC)    Followed by cardiology      Relevant Medications   simvastatin (ZOCOR) 20 MG tablet   metoprolol tartrate (LOPRESSOR) 25 MG tablet   enalapril (VASOTEC) 5 MG tablet   Other Relevant Orders   Comprehensive metabolic panel   TSH     Endocrine   Type II diabetes mellitus with neurological manifestations (HCC)    The current medical regimen is effective;  continue present plan and medications.       Relevant Medications   gabapentin (NEURONTIN) 300 MG capsule   GLIPIZIDE XL 2.5 MG 24 hr tablet   simvastatin (ZOCOR) 20 MG tablet   enalapril (VASOTEC) 5 MG tablet   Other Relevant Orders   Comprehensive metabolic panel   Lipid panel   CBC with Differential/Platelet   TSH   Urinalysis, Routine w reflex microscopic   Bayer DCA Hb A1c Waived     Musculoskeletal and Integument   Arthritis of ankle, left    Discussed Zostrix      Relevant Medications   gabapentin (NEURONTIN) 300 MG capsule      Genitourinary   BPH with obstruction/lower urinary tract symptoms    The current medical regimen is effective;  continue present plan and medications.       Relevant Medications  tamsulosin (FLOMAX) 0.4 MG CAPS capsule     Other   Hyperlipidemia    The current medical regimen is effective;  continue present plan and medications.       Relevant Medications   simvastatin (ZOCOR) 20 MG tablet   metoprolol tartrate (LOPRESSOR) 25 MG tablet   enalapril (VASOTEC) 5 MG tablet   Other Relevant Orders   Lipid panel   Advanced care planning/counseling discussion    A voluntary discussion about advanced care planning including explanation and discussion of advanced directives was extentively discussed with the patient.  Explained about the healthcare proxy and living will was reviewed and packet with forms with expiration of how to fill them out was given.  Time spent: Encounter 16+ min individuals present: Patient          Follow up plan: Return in about 6 months (around 04/06/2019) for Hemoglobin A1c, BMP,  Lipids, ALT, AST.

## 2018-10-06 NOTE — Assessment & Plan Note (Signed)
Followed by cardiology 

## 2018-10-06 NOTE — Assessment & Plan Note (Signed)
Discussed Zostrix

## 2018-10-06 NOTE — Assessment & Plan Note (Signed)
A voluntary discussion about advanced care planning including explanation and discussion of advanced directives was extentively discussed with the patient.  Explained about the healthcare proxy and living will was reviewed and packet with forms with expiration of how to fill them out was given.  Time spent: Encounter 16+ min individuals present: Patient 

## 2018-10-07 ENCOUNTER — Telehealth: Payer: Self-pay | Admitting: Family Medicine

## 2018-10-07 DIAGNOSIS — D649 Anemia, unspecified: Secondary | ICD-10-CM

## 2018-10-07 LAB — CBC WITH DIFFERENTIAL/PLATELET
Basophils Absolute: 0.1 10*3/uL (ref 0.0–0.2)
Basos: 1 %
EOS (ABSOLUTE): 0.5 10*3/uL — ABNORMAL HIGH (ref 0.0–0.4)
Eos: 9 %
HEMATOCRIT: 33.9 % — AB (ref 37.5–51.0)
Hemoglobin: 11.1 g/dL — ABNORMAL LOW (ref 13.0–17.7)
IMMATURE GRANS (ABS): 0 10*3/uL (ref 0.0–0.1)
Immature Granulocytes: 0 %
Lymphocytes Absolute: 1.1 10*3/uL (ref 0.7–3.1)
Lymphs: 21 %
MCH: 30.1 pg (ref 26.6–33.0)
MCHC: 32.7 g/dL (ref 31.5–35.7)
MCV: 92 fL (ref 79–97)
Monocytes Absolute: 0.5 10*3/uL (ref 0.1–0.9)
Monocytes: 9 %
Neutrophils Absolute: 3.2 10*3/uL (ref 1.4–7.0)
Neutrophils: 60 %
Platelets: 173 10*3/uL (ref 150–450)
RBC: 3.69 x10E6/uL — ABNORMAL LOW (ref 4.14–5.80)
RDW: 12.9 % (ref 11.6–15.4)
WBC: 5.3 10*3/uL (ref 3.4–10.8)

## 2018-10-07 LAB — COMPREHENSIVE METABOLIC PANEL
A/G RATIO: 1.7 (ref 1.2–2.2)
ALT: 13 IU/L (ref 0–44)
AST: 16 IU/L (ref 0–40)
Albumin: 3.8 g/dL (ref 3.6–4.6)
Alkaline Phosphatase: 71 IU/L (ref 39–117)
BUN/Creatinine Ratio: 16 (ref 10–24)
BUN: 13 mg/dL (ref 8–27)
Bilirubin Total: 1.1 mg/dL (ref 0.0–1.2)
CALCIUM: 8.7 mg/dL (ref 8.6–10.2)
CO2: 26 mmol/L (ref 20–29)
CREATININE: 0.83 mg/dL (ref 0.76–1.27)
Chloride: 104 mmol/L (ref 96–106)
GFR, EST AFRICAN AMERICAN: 90 mL/min/{1.73_m2} (ref >59)
GFR, EST NON AFRICAN AMERICAN: 78 mL/min/{1.73_m2} (ref >59)
GLOBULIN, TOTAL: 2.3 g/dL (ref 1.5–4.5)
Glucose: 95 mg/dL (ref 65–99)
Potassium: 4.4 mmol/L (ref 3.5–5.2)
Sodium: 142 mmol/L (ref 134–144)
TOTAL PROTEIN: 6.1 g/dL (ref 6.0–8.5)

## 2018-10-07 LAB — TSH: TSH: 1.12 u[IU]/mL (ref 0.450–4.500)

## 2018-10-07 LAB — LIPID PANEL
Chol/HDL Ratio: 2.2 ratio (ref 0.0–5.0)
Cholesterol, Total: 122 mg/dL (ref 100–199)
HDL: 56 mg/dL (ref >39)
LDL Calculated: 57 mg/dL (ref 0–99)
Triglycerides: 44 mg/dL (ref 0–149)
VLDL Cholesterol Cal: 9 mg/dL (ref 5–40)

## 2018-10-07 NOTE — Telephone Encounter (Signed)
-----   Message from Richarda Overlie, [place] sent at 10/07/2018 12:38 PM EST ----- Patient was transferred to provider for telephone conversation.

## 2018-10-07 NOTE — Telephone Encounter (Signed)
Phone call Discussed with patient anemia will recheck CBC 1 month

## 2018-11-07 ENCOUNTER — Other Ambulatory Visit: Payer: Medicare Other

## 2018-11-07 DIAGNOSIS — D649 Anemia, unspecified: Secondary | ICD-10-CM

## 2018-11-08 LAB — CBC WITH DIFFERENTIAL/PLATELET
Basophils Absolute: 0.1 10*3/uL (ref 0.0–0.2)
Basos: 1 %
EOS (ABSOLUTE): 0.9 10*3/uL — ABNORMAL HIGH (ref 0.0–0.4)
Eos: 13 %
Hematocrit: 37.8 % (ref 37.5–51.0)
Hemoglobin: 12.3 g/dL — ABNORMAL LOW (ref 13.0–17.7)
IMMATURE GRANULOCYTES: 0 %
Immature Grans (Abs): 0 10*3/uL (ref 0.0–0.1)
Lymphocytes Absolute: 1.8 10*3/uL (ref 0.7–3.1)
Lymphs: 26 %
MCH: 30.6 pg (ref 26.6–33.0)
MCHC: 32.5 g/dL (ref 31.5–35.7)
MCV: 94 fL (ref 79–97)
Monocytes Absolute: 0.6 10*3/uL (ref 0.1–0.9)
Monocytes: 8 %
Neutrophils Absolute: 3.7 10*3/uL (ref 1.4–7.0)
Neutrophils: 52 %
Platelets: 219 10*3/uL (ref 150–450)
RBC: 4.02 x10E6/uL — ABNORMAL LOW (ref 4.14–5.80)
RDW: 12.3 % (ref 11.6–15.4)
WBC: 7.1 10*3/uL (ref 3.4–10.8)

## 2019-02-03 MED ORDER — GABAPENTIN 300 MG PO CAPS
300.00 | ORAL_CAPSULE | ORAL | Status: DC
Start: 2019-02-03 — End: 2019-02-03

## 2019-02-03 MED ORDER — CLOPIDOGREL BISULFATE 75 MG PO TABS
75.00 | ORAL_TABLET | ORAL | Status: DC
Start: 2019-02-04 — End: 2019-02-03

## 2019-02-03 MED ORDER — GLIPIZIDE 5 MG PO TABS
5.00 | ORAL_TABLET | ORAL | Status: DC
Start: 2019-02-03 — End: 2019-02-03

## 2019-02-03 MED ORDER — ACETAMINOPHEN 500 MG PO TABS
1000.00 | ORAL_TABLET | ORAL | Status: DC
Start: 2019-02-03 — End: 2019-02-03

## 2019-02-03 MED ORDER — ASPIRIN 81 MG PO CHEW
81.00 | CHEWABLE_TABLET | ORAL | Status: DC
Start: 2019-02-04 — End: 2019-02-03

## 2019-02-03 MED ORDER — LORATADINE 10 MG PO TABS
10.00 | ORAL_TABLET | ORAL | Status: DC
Start: 2019-02-04 — End: 2019-02-03

## 2019-02-03 MED ORDER — ENOXAPARIN SODIUM 40 MG/0.4ML ~~LOC~~ SOLN
40.00 | SUBCUTANEOUS | Status: DC
Start: 2019-02-03 — End: 2019-02-03

## 2019-02-03 MED ORDER — POLYETHYLENE GLYCOL 3350 17 G PO PACK
17.00 | PACK | ORAL | Status: DC
Start: ? — End: 2019-02-03

## 2019-02-03 MED ORDER — MELATONIN 3 MG PO TABS
3.00 | ORAL_TABLET | ORAL | Status: DC
Start: ? — End: 2019-02-03

## 2019-02-03 MED ORDER — METOPROLOL SUCCINATE ER 25 MG PO TB24
25.00 | ORAL_TABLET | ORAL | Status: DC
Start: 2019-02-04 — End: 2019-02-03

## 2019-02-03 MED ORDER — TAMSULOSIN HCL 0.4 MG PO CAPS
0.40 | ORAL_CAPSULE | ORAL | Status: DC
Start: 2019-02-04 — End: 2019-02-03

## 2019-02-03 MED ORDER — ENALAPRIL MALEATE 5 MG PO TABS
5.00 | ORAL_TABLET | ORAL | Status: DC
Start: 2019-02-04 — End: 2019-02-03

## 2019-02-03 MED ORDER — GENERIC EXTERNAL MEDICATION
150.00 | Status: DC
Start: ? — End: 2019-02-03

## 2019-02-03 MED ORDER — GENERIC EXTERNAL MEDICATION
Status: DC
Start: ? — End: 2019-02-03

## 2019-02-03 MED ORDER — GENERIC EXTERNAL MEDICATION
2.00 | Status: DC
Start: ? — End: 2019-02-03

## 2019-02-03 MED ORDER — ALUM & MAG HYDROXIDE-SIMETH 400-400-40 MG/5ML PO SUSP
30.00 | ORAL | Status: DC
Start: ? — End: 2019-02-03

## 2019-02-26 ENCOUNTER — Encounter: Payer: Self-pay | Admitting: Family Medicine

## 2019-02-26 ENCOUNTER — Other Ambulatory Visit: Payer: Self-pay

## 2019-02-26 ENCOUNTER — Ambulatory Visit (INDEPENDENT_AMBULATORY_CARE_PROVIDER_SITE_OTHER): Payer: Medicare Other | Admitting: Family Medicine

## 2019-02-26 VITALS — BP 121/64 | HR 90

## 2019-02-26 DIAGNOSIS — E1149 Type 2 diabetes mellitus with other diabetic neurological complication: Secondary | ICD-10-CM

## 2019-02-26 DIAGNOSIS — D649 Anemia, unspecified: Secondary | ICD-10-CM

## 2019-02-26 DIAGNOSIS — I10 Essential (primary) hypertension: Secondary | ICD-10-CM

## 2019-02-26 DIAGNOSIS — E785 Hyperlipidemia, unspecified: Secondary | ICD-10-CM

## 2019-02-26 DIAGNOSIS — M19072 Primary osteoarthritis, left ankle and foot: Secondary | ICD-10-CM | POA: Diagnosis not present

## 2019-02-26 LAB — LP+ALT+AST PICCOLO, WAIVED
ALT (SGPT) Piccolo, Waived: 22 U/L (ref 10–47)
AST (SGOT) Piccolo, Waived: 17 U/L (ref 11–38)
Chol/HDL Ratio Piccolo,Waive: 2.2 mg/dL
Cholesterol Piccolo, Waived: 116 mg/dL (ref <200)
HDL Chol Piccolo, Waived: 52 mg/dL — ABNORMAL LOW (ref >59)
LDL Chol Calc Piccolo Waived: 47 mg/dL (ref <100)
Triglycerides Piccolo,Waived: 84 mg/dL (ref <150)
VLDL Chol Calc Piccolo,Waive: 17 mg/dL (ref <30)

## 2019-02-26 LAB — BAYER DCA HB A1C WAIVED: HB A1C (BAYER DCA - WAIVED): 5.3 % (ref <7.0)

## 2019-02-26 MED ORDER — GABAPENTIN 300 MG PO CAPS: 300.0000 mg | ORAL_CAPSULE | Freq: Three times a day (TID) | ORAL | 4 refills | Status: DC

## 2019-02-26 NOTE — Assessment & Plan Note (Signed)
The current medical regimen is effective;  continue present plan and medications.  

## 2019-02-26 NOTE — Progress Notes (Signed)
BP 121/64   Pulse 90    Subjective:    Patient ID: Jeffery Horne, male    DOB: 12-Jan-1927, 85 y.o.   MRN: [id number]  HPI: Jeffery Horne is a 85 y.o. male  Med check  Discussed with patient all in all doing well no complaints no low blood sugar spells or issues with diabetes. Cholesterol blood pressure also doing well with no complaints. Reviewed labs patient has not had follow-up CBC for anemia will check that again today also.   Relevant past medical, surgical, family and social history reviewed and updated as indicated. Interim medical history since our last visit reviewed. Allergies and medications reviewed and updated.  Review of Systems  Constitutional: Negative.   Respiratory: Negative.   Cardiovascular: Negative.     Per HPI unless specifically indicated above     Objective:    BP 121/64   Pulse 90   Wt Readings from Last 3 Encounters:  10/06/18 186 lb 1.6 oz (84.4 kg)  03/20/18 182 lb 1.6 oz (82.6 kg)  01/16/18 187 lb (84.8 kg)    Physical Exam  Results for orders placed or performed in visit on 11/07/18  CBC with Differential/Platelet  Result Value Ref Range   WBC 7.1 3.4 - 10.8 x10E3/uL   RBC 4.02 (L) 4.14 - 5.80 x10E6/uL   Hemoglobin 12.3 (L) 13.0 - 17.7 g/dL   Hematocrit 08.637.8 57.837.5 - 51.0 %   MCV 94 79 - 97 fL   MCH 30.6 26.6 - 33.0 pg   MCHC 32.5 31.5 - 35.7 g/dL   RDW 46.912.3 62.911.6 - 52.815.4 %   Platelets 219 150 - 450 x10E3/uL   Neutrophils 52 Not Estab. %   Lymphs 26 Not Estab. %   Monocytes 8 Not Estab. %   Eos 13 Not Estab. %   Basos 1 Not Estab. %   Neutrophils Absolute 3.7 1.4 - 7.0 x10E3/uL   Lymphocytes Absolute 1.8 0.7 - 3.1 x10E3/uL   Monocytes Absolute 0.6 0.1 - 0.9 x10E3/uL   EOS (ABSOLUTE) 0.9 (H) 0.0 - 0.4 x10E3/uL   Basophils Absolute 0.1 0.0 - 0.2 x10E3/uL   Immature Granulocytes 0 Not Estab. %   Immature Grans (Abs) 0.0 0.0 - 0.1 x10E3/uL      Assessment & Plan:   Problem List Items Addressed This Visit      Cardiovascular and Mediastinum   Hypertension    The current medical regimen is effective;  continue present plan and medications.       Relevant Orders   Basic metabolic panel     Nervous and Auditory   Type II diabetes mellitus with neurological manifestations (HCC)    The current medical regimen is effective;  continue present plan and medications.       Relevant Medications   gabapentin (NEURONTIN) 300 MG capsule   Other Relevant Orders   Bayer DCA Hb A1c Waived     Musculoskeletal and Integument   Arthritis of ankle, left   Relevant Medications   gabapentin (NEURONTIN) 300 MG capsule     Other   Hyperlipidemia    The current medical regimen is effective;  continue present plan and medications.       Relevant Orders   LP+ALT+AST Piccolo, LaffertyWaived    Other Visit Diagnoses    Anemia, unspecified type    -  Primary   Relevant Orders   CBC with Differential/Platelet       Telemedicine using audio/video telecommunications for a synchronous communication  visit. Today's visit due to COVID-19 isolation precautions I connected with and verified that I am speaking with the correct person using two identifiers.   I discussed the limitations, risks, security and privacy concerns of performing an evaluation and management service by telecommunication and the availability of in person appointments. I also discussed with the patient that there may be a patient responsible charge related to this service. The patient expressed understanding and agreed to proceed. The patient's location is home. I am at home.   I discussed the assessment and treatment plan with the patient. The patient was provided an opportunity to ask questions and all were answered. The patient agreed with the plan and demonstrated an understanding of the instructions.   The patient was advised to call back or seek an in-person evaluation if the symptoms worsen or if the condition fails to improve as  anticipated.   I provided 21+ minutes of time during this encounter. Follow up plan: Return in about 6 months (around 08/28/2019) for Physical Exam.

## 2019-02-27 LAB — CBC WITH DIFFERENTIAL/PLATELET
Basophils Absolute: 0.1 10*3/uL (ref 0.0–0.2)
Basos: 1 %
EOS (ABSOLUTE): 0.4 10*3/uL (ref 0.0–0.4)
Eos: 7 %
Hematocrit: 34.4 % — ABNORMAL LOW (ref 37.5–51.0)
Hemoglobin: 11.4 g/dL — ABNORMAL LOW (ref 13.0–17.7)
Immature Grans (Abs): 0 10*3/uL (ref 0.0–0.1)
Immature Granulocytes: 0 %
Lymphocytes Absolute: 1.3 10*3/uL (ref 0.7–3.1)
Lymphs: 19 %
MCH: 30.3 pg (ref 26.6–33.0)
MCHC: 33.1 g/dL (ref 31.5–35.7)
MCV: 92 fL (ref 79–97)
Monocytes Absolute: 0.5 10*3/uL (ref 0.1–0.9)
Monocytes: 8 %
Neutrophils Absolute: 4.4 10*3/uL (ref 1.4–7.0)
Neutrophils: 65 %
Platelets: 196 10*3/uL (ref 150–450)
RBC: 3.76 x10E6/uL — ABNORMAL LOW (ref 4.14–5.80)
RDW: 13.1 % (ref 11.6–15.4)
WBC: 6.7 10*3/uL (ref 3.4–10.8)

## 2019-02-27 LAB — BASIC METABOLIC PANEL
BUN/Creatinine Ratio: 26 — ABNORMAL HIGH (ref 10–24)
BUN: 22 mg/dL (ref 10–36)
CO2: 26 mmol/L (ref 20–29)
Calcium: 8.9 mg/dL (ref 8.6–10.2)
Chloride: 106 mmol/L (ref 96–106)
Creatinine, Ser: 0.85 mg/dL (ref 0.76–1.27)
GFR calc Af Amer: 89 mL/min/{1.73_m2} (ref >59)
GFR calc non Af Amer: 77 mL/min/{1.73_m2} (ref >59)
Glucose: 103 mg/dL — ABNORMAL HIGH (ref 65–99)
Potassium: 3.9 mmol/L (ref 3.5–5.2)
Sodium: 146 mmol/L — ABNORMAL HIGH (ref 134–144)

## 2019-03-06 ENCOUNTER — Telehealth: Payer: Self-pay | Admitting: Family Medicine

## 2019-03-06 NOTE — Telephone Encounter (Signed)
Will leave for provider review

## 2019-03-06 NOTE — Telephone Encounter (Signed)
They visited pt today and pt test positive for PVD in right leg and he is not having any symptoms. FYI.

## 2019-04-06 ENCOUNTER — Ambulatory Visit: Payer: Medicare Other | Admitting: Family Medicine

## 2019-10-11 ENCOUNTER — Encounter: Payer: Self-pay | Admitting: Nurse Practitioner

## 2019-10-13 ENCOUNTER — Other Ambulatory Visit: Payer: Self-pay

## 2019-10-13 ENCOUNTER — Ambulatory Visit (INDEPENDENT_AMBULATORY_CARE_PROVIDER_SITE_OTHER): Payer: Medicare Other | Admitting: Nurse Practitioner

## 2019-10-13 ENCOUNTER — Encounter: Payer: Self-pay | Admitting: Nurse Practitioner

## 2019-10-13 VITALS — BP 122/60 | HR 75 | Temp 97.7°F | Ht 67.5 in | Wt 177.8 lb

## 2019-10-13 DIAGNOSIS — E1149 Type 2 diabetes mellitus with other diabetic neurological complication: Secondary | ICD-10-CM

## 2019-10-13 DIAGNOSIS — I6521 Occlusion and stenosis of right carotid artery: Secondary | ICD-10-CM

## 2019-10-13 DIAGNOSIS — E1159 Type 2 diabetes mellitus with other circulatory complications: Secondary | ICD-10-CM | POA: Diagnosis not present

## 2019-10-13 DIAGNOSIS — E1169 Type 2 diabetes mellitus with other specified complication: Secondary | ICD-10-CM

## 2019-10-13 DIAGNOSIS — N401 Enlarged prostate with lower urinary tract symptoms: Secondary | ICD-10-CM

## 2019-10-13 DIAGNOSIS — E785 Hyperlipidemia, unspecified: Secondary | ICD-10-CM

## 2019-10-13 DIAGNOSIS — I10 Essential (primary) hypertension: Secondary | ICD-10-CM

## 2019-10-13 DIAGNOSIS — Z0000 Encounter for general adult medical examination without abnormal findings: Secondary | ICD-10-CM

## 2019-10-13 DIAGNOSIS — N138 Other obstructive and reflux uropathy: Secondary | ICD-10-CM

## 2019-10-13 DIAGNOSIS — I495 Sick sinus syndrome: Secondary | ICD-10-CM

## 2019-10-13 DIAGNOSIS — I152 Hypertension secondary to endocrine disorders: Secondary | ICD-10-CM

## 2019-10-13 LAB — MICROALBUMIN, URINE WAIVED
Creatinine, Urine Waived: 50 mg/dL (ref 10–300)
Microalb, Ur Waived: 30 mg/L — ABNORMAL HIGH (ref 0–19)

## 2019-10-13 LAB — BAYER DCA HB A1C WAIVED: HB A1C (BAYER DCA - WAIVED): 5.6 % (ref <7.0)

## 2019-10-13 MED ORDER — GLIPIZIDE 5 MG PO TABS: 2.5000 mg | ORAL_TABLET | Freq: Every day | ORAL | 3 refills | Status: DC

## 2019-10-13 NOTE — Progress Notes (Signed)
BP 122/60 (BP Location: Left Arm, Patient Position: Sitting)   Pulse 75   Temp 97.7 F (36.5 C) (Oral)   Ht 5' 7.5" (1.715 m)   Wt 177 lb 12.8 oz (80.6 kg)   SpO2 96%   BMI 27.44 kg/m    Subjective:    Patient ID: Jeffery Horne, male    DOB: Dec 21, 1926, 86 y.o.   MRN: [id number]  HPI: Jeffery Horne is a 86 y.o. male presenting on 10/13/2019 for comprehensive medical examination. Current medical complaints include:none  He currently lives with: son Interim Problems from his last visit: no   DIABETES Continues on Glipizide 5 MG daily.  Takes Gabapentin for neuropathy.  Last A1C in June 5.3%. Hypoglycemic episodes:no Polydipsia/polyuria: no Visual disturbance: no Chest pain: no Paresthesias: no Glucose Monitoring: no  Accucheck frequency: Not Checking  Fasting glucose:  Post prandial:  Evening:  Before meals: Taking Insulin?: no  Long acting insulin:  Short acting insulin: Blood Pressure Monitoring: monthly Retinal Examination: Not up to Date Foot Exam: Up to Date Pneumovax: Up to Date Influenza: refused Aspirin: yes   HYPERTENSION / HYPERLIPIDEMIA Continues on ASA, Plavix, Metoprolol, and Simvastatin.  He also takes daily Tamsulosin for BPH.  Followed by Dr. Jacolyn Reedy at [facility] and last seen 06/22/2019.  Has pacemaker in place, placed 2015.  Satisfied with current treatment? yes Duration of hypertension: chronic BP monitoring frequency: monthly BP range: 130/70's range at home BP medication side effects: no Duration of hyperlipidemia: chronic Cholesterol medication side effects: no Cholesterol supplements: none Medication compliance: good compliance Aspirin: yes Recent stressors: no Recurrent headaches: no Visual changes: no Palpitations: no Dyspnea: no Chest pain: no Lower extremity edema: no Dizzy/lightheaded: no  Functional Status Survey: Is the patient deaf or have difficulty hearing?: Yes Does the patient have difficulty seeing, even when wearing  glasses/contacts?: No Does the patient have difficulty concentrating, remembering, or making decisions?: No Does the patient have difficulty walking or climbing stairs?: Yes Does the patient have difficulty dressing or bathing?: No Does the patient have difficulty doing errands alone such as visiting a doctor's office or shopping?: No  FALL RISK: Fall Risk  10/13/2019 10/06/2018 10/01/2017 09/13/2017 08/30/2017  Falls in the past year? 1 1 No No No  Number falls in past yr: 1 0 - - -  Injury with Fall? 0 0 - - -  Risk for fall due to : - Impaired balance/gait - - -  Follow up Falls evaluation completed Falls evaluation completed - - -    Depression Screen Depression screen [facility] 2/9 10/13/2019 10/06/2018 10/01/2017 09/13/2017 08/30/2017  Decreased Interest 0 0 0 0 0  Down, Depressed, Hopeless 0 0 0 0 0  PHQ - 2 Score 0 0 0 0 0  Altered sleeping - 0 - - -  Tired, decreased energy - 0 - - -  Change in appetite - 0 - - -  Feeling bad or failure about yourself  - 0 - - -  Trouble concentrating - 0 - - -  Moving slowly or fidgety/restless - 0 - - -  Suicidal thoughts - 0 - - -  PHQ-9 Score - 0 - - -    Advanced Directives <no information>  Past Medical History:  Past Medical History:  Diagnosis Date  . BPH (benign prostatic hypertrophy) with urinary obstruction   . Hearing impairment   . Hyperlipidemia   . Hypertension   . Peripheral vascular disease (HCC)   . Primary gout   .  Type II diabetes mellitus with neurological manifestations [facility](HCC)     Surgical History:  Past Surgical History:  Procedure Laterality Date  . HERNIA REPAIR    . PACEMAKER INSERTION     Dr. Juel BurrowMasoud    Medications:  Current Outpatient Medications on File Prior to Visit  Medication Sig  . aspirin EC 81 MG tablet Take 81 mg by mouth daily.  . clopidogrel (PLAVIX) 75 MG tablet Take 75 mg by mouth daily.  . enalapril (VASOTEC) 5 MG tablet Take 1 tablet (5 mg total) by mouth daily.  . fluticasone (FLONASE) 50  MCG/ACT nasal spray Place 2 sprays into both nostrils daily.  Marland Kitchen. gabapentin (NEURONTIN) 300 MG capsule Take 1 capsule (300 mg total) by mouth 3 (three) times daily.  Marland Kitchen. glucose blood test strip Use as instructed  . metoprolol tartrate (LOPRESSOR) 25 MG tablet Take 1 tablet (25 mg total) by mouth daily.  . simvastatin (ZOCOR) 20 MG tablet Take 1 tablet (20 mg total) by mouth daily.  . tamsulosin (FLOMAX) 0.4 MG CAPS capsule Take 1 capsule (0.4 mg total) by mouth daily.   No current facility-administered medications on file prior to visit.    Allergies:  No Known Allergies  Social History:  Social History   Socioeconomic History  . Marital status: Widowed    Spouse name: Not on file  . Number of children: Not on file  . Years of education: Not on file  . Highest education level: GED or equivalent  Occupational History  . Not on file  Tobacco Use  . Smoking status: Former Smoker    Types: Cigarettes    Quit date: 09/11/1971    Years since quitting: 48.1  . Smokeless tobacco: Never Used  Substance and Sexual Activity  . Alcohol use: No  . Drug use: No  . Sexual activity: Not on file  Other Topics Concern  . Not on file  Social History Narrative  . Not on file   Social Determinants of Health   Financial Resource Strain:   . Difficulty of Paying Living Expenses: Not on file  Food Insecurity:   . Worried About Programme researcher, broadcasting/film/videounning Out of Food in the Last Year: Not on file  . Ran Out of Food in the Last Year: Not on file  Transportation Needs:   . Lack of Transportation (Medical): Not on file  . Lack of Transportation (Non-Medical): Not on file  Physical Activity:   . Days of Exercise per Week: Not on file  . Minutes of Exercise per Session: Not on file  Stress:   . Feeling of Stress : Not on file  Social Connections:   . Frequency of Communication with Friends and Family: Not on file  . Frequency of Social Gatherings with Friends and Family: Not on file  . Attends Religious Services:  Not on file  . Active Member of Clubs or Organizations: Not on file  . Attends BankerClub or Organization Meetings: Not on file  . Marital Status: Not on file  Intimate Partner Violence:   . Fear of Current or Ex-Partner: Not on file  . Emotionally Abused: Not on file  . Physically Abused: Not on file  . Sexually Abused: Not on file   Social History   Tobacco Use  Smoking Status Former Smoker  . Types: Cigarettes  . Quit date: 09/11/1971  . Years since quitting: 48.1  Smokeless Tobacco Never Used   Social History   Substance and Sexual Activity  Alcohol Use No  Family History:  Family History  Problem Relation Age of Onset  . Diabetes Mother   . Hypertension Mother   . Stroke Mother   . Hypertension Father     Past medical history, surgical history, medications, allergies, family history and social history reviewed with patient today and changes made to appropriate areas of the chart.   Review of Systems - negative All other ROS negative except what is listed above and in the HPI.      Objective:    BP 122/60 (BP Location: Left Arm, Patient Position: Sitting)   Pulse 75   Temp 97.7 F (36.5 C) (Oral)   Ht 5' 7.5" (1.715 m)   Wt 177 lb 12.8 oz (80.6 kg)   SpO2 96%   BMI 27.44 kg/m   Wt Readings from Last 3 Encounters:  10/13/19 177 lb 12.8 oz (80.6 kg)  10/06/18 186 lb 1.6 oz (84.4 kg)  03/20/18 182 lb 1.6 oz (82.6 kg)    Physical Exam Vitals and nursing note reviewed.  Constitutional:      General: He is awake. He is not in acute distress.    Appearance: He is well-developed and overweight. He is not ill-appearing.  HENT:     Head: Normocephalic and atraumatic.     Right Ear: Hearing, tympanic membrane, ear canal and external ear normal. No drainage.     Left Ear: Hearing, tympanic membrane, ear canal and external ear normal. No drainage.     Nose: Nose normal.     Mouth/Throat:     Pharynx: Uvula midline.  Eyes:     General: Lids are normal.         Right eye: No discharge.        Left eye: No discharge.     Extraocular Movements: Extraocular movements intact.     Conjunctiva/sclera: Conjunctivae normal.     Pupils: Pupils are equal, round, and reactive to light.     Visual Fields: Right eye visual fields normal and left eye visual fields normal.  Neck:     Thyroid: No thyromegaly.     Vascular: No carotid bruit or JVD.     Trachea: Trachea normal.  Cardiovascular:     Rate and Rhythm: Normal rate and regular rhythm.     Heart sounds: Normal heart sounds, S1 normal and S2 normal. No murmur. No gallop.   Pulmonary:     Effort: Pulmonary effort is normal. No accessory muscle usage or respiratory distress.     Breath sounds: Normal breath sounds.  Abdominal:     General: Bowel sounds are normal.     Palpations: Abdomen is soft. There is no hepatomegaly or splenomegaly.     Tenderness: There is no abdominal tenderness.  Musculoskeletal:        General: Normal range of motion.     Cervical back: Normal range of motion and neck supple.     Right lower leg: No edema.     Left lower leg: No edema.  Lymphadenopathy:     Head:     Right side of head: No submental, submandibular, tonsillar, preauricular or posterior auricular adenopathy.     Left side of head: No submental, submandibular, tonsillar, preauricular or posterior auricular adenopathy.     Cervical: No cervical adenopathy.  Skin:    General: Skin is warm and dry.     Capillary Refill: Capillary refill takes less than 2 seconds.     Findings: No rash.  Neurological:  Mental Status: He is alert and oriented to person, place, and time.     Cranial Nerves: Cranial nerves are intact.     Gait: Gait is intact.     Deep Tendon Reflexes: Reflexes are normal and symmetric.     Reflex Scores:      Brachioradialis reflexes are 2+ on the right side and 2+ on the left side.      Patellar reflexes are 2+ on the right side and 2+ on the left side. Psychiatric:        Attention and  Perception: Attention normal.        Mood and Affect: Mood normal.        Speech: Speech normal.        Behavior: Behavior normal. Behavior is cooperative.        Thought Content: Thought content normal.        Cognition and Memory: Cognition normal.        Judgment: Judgment normal.    Diabetic Foot Exam - Simple   Simple Foot Form Visual Inspection No deformities, no ulcerations, no other skin breakdown bilaterally: Yes Sensation Testing See comments: Yes Pulse Check Posterior Tibialis and Dorsalis pulse intact bilaterally: Yes Comments Right foot 6/10 and left 6/10    6CIT Screen 10/13/2019 09/13/2017  What Year? 0 points 0 points  What month? 0 points 0 points  What time? 0 points 0 points  Count back from 20 0 points 0 points  Months in reverse 0 points 0 points  Repeat phrase 0 points 0 points  Total Score 0 0    Results for orders placed or performed in visit on 02/26/19  LP+ALT+AST Piccolo, Waived  Result Value Ref Range   ALT (SGPT) Piccolo, Waived 22 10 - 47 U/L   AST (SGOT) Piccolo, Waived 17 11 - 38 U/L   Cholesterol Piccolo, Waived 116 <200 mg/dL   HDL Chol Piccolo, Waived 52 (L) >59 mg/dL   Triglycerides Piccolo,Waived 84 <150 mg/dL   Chol/HDL Ratio Piccolo,Waive 2.2 mg/dL   LDL Chol Calc Piccolo Waived 47 <100 mg/dL   VLDL Chol Calc Piccolo,Waive 17 <30 mg/dL  Bayer DCA Hb A1c Waived  Result Value Ref Range   HB A1C (BAYER DCA - WAIVED) 5.3 <7.0 %  CBC with Differential/Platelet  Result Value Ref Range   WBC 6.7 3.4 - 10.8 x10E3/uL   RBC 3.76 (L) 4.14 - 5.80 x10E6/uL   Hemoglobin 11.4 (L) 13.0 - 17.7 g/dL   Hematocrit 34.4 (L) 37.5 - 51.0 %   MCV 92 79 - 97 fL   MCH 30.3 26.6 - 33.0 pg   MCHC 33.1 31.5 - 35.7 g/dL   RDW 13.1 11.6 - 15.4 %   Platelets 196 150 - 450 x10E3/uL   Neutrophils 65 Not Estab. %   Lymphs 19 Not Estab. %   Monocytes 8 Not Estab. %   Eos 7 Not Estab. %   Basos 1 Not Estab. %   Neutrophils Absolute 4.4 1.4 - 7.0 x10E3/uL    Lymphocytes Absolute 1.3 0.7 - 3.1 x10E3/uL   Monocytes Absolute 0.5 0.1 - 0.9 x10E3/uL   EOS (ABSOLUTE) 0.4 0.0 - 0.4 x10E3/uL   Basophils Absolute 0.1 0.0 - 0.2 x10E3/uL   Immature Granulocytes 0 Not Estab. %   Immature Grans (Abs) 0.0 0.0 - 0.1 P10C5/EN  Basic metabolic panel  Result Value Ref Range   Glucose 103 (H) 65 - 99 mg/dL   BUN 22 10 - 36 mg/dL  Creatinine, Ser 0.85 0.76 - 1.27 mg/dL   GFR calc non Af Amer 77 >59 mL/min/1.73   GFR calc Af Amer 89 >59 mL/min/1.73   BUN/Creatinine Ratio 26 (H) 10 - 24   Sodium 146 (H) 134 - 144 mmol/L   Potassium 3.9 3.5 - 5.2 mmol/L   Chloride 106 96 - 106 mmol/L   CO2 26 20 - 29 mmol/L   Calcium 8.9 8.6 - 10.2 mg/dL      Assessment & Plan:   Problem List Items Addressed This Visit      Cardiovascular and Mediastinum   Hypertension associated with diabetes (HCC)    Chronic, ongoing.  Initial BP high in office today, but manual repeat below goal and home readings at goal, due to advanced age to avoid falls goal <140/90.  Continue current medication regimen and [facility]  Recommend he continue to monitor BP at home. Avoid hypotension.  Labs today, return in 3 months.      Relevant Medications   glipiZIDE (GLUCOTROL) 5 MG tablet   Other Relevant Orders   Bayer DCA Hb A1c Waived   CBC with Differential/Platelet   TSH   Sick sinus syndrome (HCC)    Chronic, stable.  [facility].      Carotid artery stenosis, symptomatic, right    Chronic, ongoing.  [facility]  Continue this collaboration and current medication regimen.        Endocrine   Type II diabetes mellitus with neurological manifestations (HCC)    Chronic, stable with A1C 5.6% and urine ALB 30 with A:C 30-300.  Due to advanced age and risk for falls, will decrease Glipizide to 2.5 MG daily.  Discussed with patient to take 1/2 tablet.  Recommend he monitor BS in morning three times a  week.  Will have return in 4 weeks and if BS in morning stable will plan to d/c Glipizide.  Concern for hypoglycemia and falls. Goal A1C for age <8.  If A1C elevations without Glipizide could consider Jardiance or Farxiga at low dose.  Return in 4 weeks.      Relevant Medications   glipiZIDE (GLUCOTROL) 5 MG tablet   Other Relevant Orders   Bayer DCA Hb A1c Waived   Microalbumin, Urine Waived   Hyperlipidemia associated with type 2 diabetes mellitus (HCC)    Chronic, ongoing.  Continue current medication regimen and adjust as needed.  Lipid panel today.      Relevant Medications   glipiZIDE (GLUCOTROL) 5 MG tablet   Other Relevant Orders   Bayer DCA Hb A1c Waived   Comprehensive metabolic panel   Lipid Panel w/o Chol/HDL Ratio     Genitourinary   BPH with obstruction/lower urinary tract symptoms    Chronic, ongoing.  Continue current medication regimen and adjust as needed.         Other Visit Diagnoses    Annual physical exam    -  Primary   Annual labs to include TSH, CBC, CMP, lipid       Discussed aspirin prophylaxis for myocardial infarction prevention and decision was made to continue ASA  LABORATORY TESTING:  Health maintenance labs ordered today as discussed above.   The natural history of prostate cancer and ongoing controversy regarding screening and potential treatment outcomes of prostate cancer has been discussed with the patient. The meaning of a false positive PSA and a false negative PSA has been discussed. He indicates understanding of  the limitations of this screening test and wishes not to proceed with screening PSA testing.  Advanced age, not recommended.   IMMUNIZATIONS:   - Tdap: Tetanus vaccination status reviewed: refuses. - Influenza: Refused - Pneumovax: Up to date - Prevnar: Up to date - Zostavax vaccine: Refused  SCREENING: - Colonoscopy: Not applicable  Discussed with patient purpose of the colonoscopy is to detect colon cancer at curable  precancerous or early stages   - AAA Screening: Not applicable  -Hearing Test: Not applicable  -Spirometry: Not applicable   PATIENT COUNSELING:    Sexuality: Discussed sexually transmitted diseases, partner selection, use of condoms, avoidance of unintended pregnancy  and contraceptive alternatives.   Advised to avoid cigarette smoking.  I discussed with the patient that most people either abstain from alcohol or drink within safe limits (<=14/week and <=4 drinks/occasion for males, <=7/weeks and <= 3 drinks/occasion for females) and that the risk for alcohol disorders and other health effects rises proportionally with the number of drinks per week and how often a drinker exceeds daily limits.  Discussed cessation/primary prevention of drug use and availability of treatment for abuse.   Diet: Encouraged to adjust caloric intake to maintain  or achieve ideal body weight, to reduce intake of dietary saturated fat and total fat, to limit sodium intake by avoiding high sodium foods and not adding table salt, and to maintain adequate dietary potassium and calcium preferably from fresh fruits, vegetables, and low-fat dairy products.    stressed the importance of regular exercise  Injury prevention: Discussed safety belts, safety helmets, smoke detector, smoking near bedding or upholstery.   Dental health: Discussed importance of regular tooth brushing, flossing, and dental visits.   Follow up plan: NEXT PREVENTATIVE PHYSICAL DUE IN 1 YEAR. Return in about 4 weeks (around 11/10/2019) for T2DM -- med changes last visit.

## 2019-10-13 NOTE — Assessment & Plan Note (Signed)
Chronic, stable.  Continue collaboration with [facility] cardiology for checks. 

## 2019-10-13 NOTE — Patient Instructions (Signed)
Hypoglycemia Hypoglycemia is when the sugar (glucose) level in your blood is too low. Signs of low blood sugar may include:  Feeling: ? Hungry. ? Worried or nervous (anxious). ? Sweaty and clammy. ? Confused. ? Dizzy. ? Sleepy. ? Sick to your stomach (nauseous).  Having: ? A fast heartbeat. ? A headache. ? A change in your vision. ? Tingling or no feeling (numbness) around your mouth, lips, or tongue. ? Jerky movements that you cannot control (seizure).  Having trouble with: ? Moving (coordination). ? Sleeping. ? Passing out (fainting). ? Getting upset easily (irritability). Low blood sugar can happen to people who have diabetes and people who do not have diabetes. Low blood sugar can happen quickly, and it can be an emergency. Treating low blood sugar Low blood sugar is often treated by eating or drinking something sugary right away, such as:  Fruit juice, 4-6 oz (120-150 mL).  Regular soda (not diet soda), 4-6 oz (120-150 mL).  Low-fat milk, 4 oz (120 mL).  Several pieces of hard candy.  Sugar or honey, 1 Tbsp (15 mL). Treating low blood sugar if you have diabetes If you can think clearly and swallow safely, follow the 15:15 rule:  Take 15 grams of a fast-acting carb (carbohydrate). Talk with your doctor about how much you should take.  Always keep a source of fast-acting carb with you, such as: ? Sugar tablets (glucose pills). Take 3-4 pills. ? 6-8 pieces of hard candy. ? 4-6 oz (120-150 mL) of fruit juice. ? 4-6 oz (120-150 mL) of regular (not diet) soda. ? 1 Tbsp (15 mL) honey or sugar.  Check your blood sugar 15 minutes after you take the carb.  If your blood sugar is still at or below 70 mg/dL (3.9 mmol/L), take 15 grams of a carb again.  If your blood sugar does not go above 70 mg/dL (3.9 mmol/L) after 3 tries, get help right away.  After your blood sugar goes back to normal, eat a meal or a snack within 1 hour.  Treating very low blood sugar If your  blood sugar is at or below 54 mg/dL (3 mmol/L), you have very low blood sugar (severe hypoglycemia). This may also cause:  Passing out.  Jerky movements you cannot control (seizure).  Losing consciousness (coma). This is an emergency. Do not wait to see if the symptoms will go away. Get medical help right away. Call your local emergency services (911 in the [place] Do not drive yourself to the hospital. If you have very low blood sugar and you cannot eat or drink, you may need a glucagon shot (injection). A family member or friend should learn how to check your blood sugar and how to give you a glucagon shot. Ask your doctor if you need to have a glucagon shot kit at home. Follow these instructions at home: General instructions  Take over-the-counter and prescription medicines only as told by your doctor.  Stay aware of your blood sugar as told by your doctor.  Limit alcohol intake to no more than 1 drink a day for nonpregnant women and 2 drinks a day for men. One drink equals 12 oz of beer (355 mL), 5 oz of wine (148 mL), or 1 oz of hard liquor (44 mL).  Keep all follow-up visits as told by your doctor. This is important. If you have diabetes:   Follow your diabetes care plan as told by your doctor. Make sure you: ? Know the signs of low blood sugar. ?  Take your medicines as told. ? Follow your exercise and meal plan. ? Eat on time. Do not skip meals. ? Check your blood sugar as often as told by your doctor. Always check it before and after exercise. ? Follow your sick day plan when you cannot eat or drink normally. Make this plan ahead of time with your doctor.  Share your diabetes care plan with: ? Your work or school. ? People you live with.  Check your pee (urine) for ketones: ? When you are sick. ? As told by your doctor.  Carry a card or wear jewelry that says you have diabetes. Contact a doctor if:  You have trouble keeping your blood sugar in your target  range.  You have low blood sugar often. Get help right away if:  You still have symptoms after you eat or drink something sugary.  Your blood sugar is at or below 54 mg/dL (3 mmol/L).  You have jerky movements that you cannot control.  You pass out. These symptoms may be an emergency. Do not wait to see if the symptoms will go away. Get medical help right away. Call your local emergency services (911 in the [place] Do not drive yourself to the hospital. Summary  Hypoglycemia happens when the level of sugar (glucose) in your blood is too low.  Low blood sugar can happen to people who have diabetes and people who do not have diabetes. Low blood sugar can happen quickly, and it can be an emergency.  Make sure you know the signs of low blood sugar and know how to treat it.  Always keep a source of sugar (fast-acting carb) with you to treat low blood sugar. This information is not intended to replace advice given to you by your health care provider. Make sure you discuss any questions you have with your health care provider. Document Revised: 12/18/2018 Document Reviewed: 09/30/2015 Elsevier Patient Education  2020 Elsevier Inc.  

## 2019-10-13 NOTE — Assessment & Plan Note (Signed)
Chronic, ongoing.  Followed by cardiology at [facility].  Continue this collaboration and current medication regimen.

## 2019-10-13 NOTE — Assessment & Plan Note (Signed)
Chronic, stable with A1C 5.6% and urine ALB 30 with A:C 30-300.  Due to advanced age and risk for falls, will decrease Glipizide to 2.5 MG daily.  Discussed with patient to take 1/2 tablet.  Recommend he monitor BS in morning three times a week.  Will have return in 4 weeks and if BS in morning stable will plan to d/c Glipizide.  Concern for hypoglycemia and falls. Goal A1C for age <8.  If A1C elevations without Glipizide could consider Jardiance or Farxiga at low dose.  Return in 4 weeks.

## 2019-10-13 NOTE — Assessment & Plan Note (Addendum)
Chronic, ongoing.  Initial BP high in office today, but manual repeat below goal and home readings at goal, due to advanced age to avoid falls goal <140/90.  Continue current medication regimen and collaboration with cardiology at [facility].  Recommend he continue to monitor BP at home. Avoid hypotension.  Labs today, return in 3 months.

## 2019-10-13 NOTE — Assessment & Plan Note (Signed)
Chronic, ongoing.  Continue current medication regimen and adjust as needed. Lipid panel today. 

## 2019-10-13 NOTE — Assessment & Plan Note (Signed)
Chronic, ongoing.  Continue current medication regimen and adjust as needed.   

## 2019-10-14 ENCOUNTER — Encounter: Payer: Self-pay | Admitting: Nurse Practitioner

## 2019-10-14 DIAGNOSIS — E870 Hyperosmolality and hypernatremia: Secondary | ICD-10-CM | POA: Insufficient documentation

## 2019-10-14 LAB — COMPREHENSIVE METABOLIC PANEL
ALT: 14 IU/L (ref 0–44)
AST: 15 IU/L (ref 0–40)
Albumin/Globulin Ratio: 1.5 (ref 1.2–2.2)
Albumin: 3.8 g/dL (ref 3.5–4.6)
Alkaline Phosphatase: 88 IU/L (ref 39–117)
BUN/Creatinine Ratio: 11 (ref 10–24)
BUN: 9 mg/dL — ABNORMAL LOW (ref 10–36)
Bilirubin Total: 1 mg/dL (ref 0.0–1.2)
CO2: 29 mmol/L (ref 20–29)
Calcium: 8.8 mg/dL (ref 8.6–10.2)
Chloride: 108 mmol/L — ABNORMAL HIGH (ref 96–106)
Creatinine, Ser: 0.82 mg/dL (ref 0.76–1.27)
GFR calc Af Amer: 90 mL/min/{1.73_m2} (ref >59)
GFR calc non Af Amer: 78 mL/min/{1.73_m2} (ref >59)
Globulin, Total: 2.6 g/dL (ref 1.5–4.5)
Glucose: 90 mg/dL (ref 65–99)
Potassium: 5.1 mmol/L (ref 3.5–5.2)
Sodium: 147 mmol/L — ABNORMAL HIGH (ref 134–144)
Total Protein: 6.4 g/dL (ref 6.0–8.5)

## 2019-10-14 LAB — CBC WITH DIFFERENTIAL/PLATELET
Basophils Absolute: 0.1 10*3/uL (ref 0.0–0.2)
Basos: 1 %
EOS (ABSOLUTE): 1.4 10*3/uL — ABNORMAL HIGH (ref 0.0–0.4)
Eos: 21 %
Hematocrit: 37.7 % (ref 37.5–51.0)
Hemoglobin: 12.5 g/dL — ABNORMAL LOW (ref 13.0–17.7)
Immature Grans (Abs): 0 10*3/uL (ref 0.0–0.1)
Immature Granulocytes: 0 %
Lymphocytes Absolute: 1.1 10*3/uL (ref 0.7–3.1)
Lymphs: 17 %
MCH: 31.6 pg (ref 26.6–33.0)
MCHC: 33.2 g/dL (ref 31.5–35.7)
MCV: 95 fL (ref 79–97)
Monocytes Absolute: 0.6 10*3/uL (ref 0.1–0.9)
Monocytes: 9 %
Neutrophils Absolute: 3.5 10*3/uL (ref 1.4–7.0)
Neutrophils: 52 %
Platelets: 222 10*3/uL (ref 150–450)
RBC: 3.95 x10E6/uL — ABNORMAL LOW (ref 4.14–5.80)
RDW: 12.4 % (ref 11.6–15.4)
WBC: 6.7 10*3/uL (ref 3.4–10.8)

## 2019-10-14 LAB — TSH: TSH: 1.3 u[IU]/mL (ref 0.450–4.500)

## 2019-10-14 LAB — LIPID PANEL W/O CHOL/HDL RATIO
Cholesterol, Total: 121 mg/dL (ref 100–199)
HDL: 57 mg/dL (ref >39)
LDL Chol Calc (NIH): 50 mg/dL (ref 0–99)
Triglycerides: 64 mg/dL (ref 0–149)
VLDL Cholesterol Cal: 14 mg/dL (ref 5–40)

## 2019-10-14 NOTE — Progress Notes (Signed)
Good afternoon.  Please let Jeffery Horne know that overall labs remain at baseline.  Cholesterol levels are at goal.  Sodium level is slightly elevated, as has been on past labs.  Would like him to drink more water during daytime hours, which can help with this, and decrease salt intake.  We will recheck this next visit.  Thyroid, kidney, and liver function are normal.  He is doing great!!  Have a wonderful evening.

## 2019-10-15 ENCOUNTER — Telehealth: Payer: Self-pay

## 2019-10-15 NOTE — Telephone Encounter (Signed)
Pt aware of lab results 

## 2019-11-05 ENCOUNTER — Other Ambulatory Visit: Payer: Self-pay | Admitting: Nurse Practitioner

## 2019-11-05 ENCOUNTER — Telehealth: Payer: Self-pay | Admitting: Nurse Practitioner

## 2019-11-05 MED ORDER — GLUCOSE BLOOD VI STRP: ORAL_STRIP | 12 refills | Status: DC

## 2019-11-05 MED ORDER — GLUCOSE BLOOD VI STRP: ORAL_STRIP | 12 refills | Status: AC

## 2019-11-05 NOTE — Telephone Encounter (Signed)
Sent in new script with twice per day directions

## 2019-11-05 NOTE — Telephone Encounter (Signed)
Routing to provider  

## 2019-11-05 NOTE — Telephone Encounter (Signed)
Renee with [facility] called wanting how many times a day is the patient testing CB#  [phone]

## 2019-11-05 NOTE — Telephone Encounter (Signed)
Renee with [facility] notified. She stated she got the script.

## 2019-11-05 NOTE — Telephone Encounter (Signed)
glucose blood test strip [[id number]]      Jeffery Horne, with [facility], calling to request refill.    Pharmacy:  [facility] - [place], [place] - [address] [place] Phone:  [phone]  Fax:  [phone]

## 2019-11-06 ENCOUNTER — Other Ambulatory Visit: Payer: Self-pay | Admitting: Nurse Practitioner

## 2019-11-06 NOTE — Telephone Encounter (Signed)
RX faxed to the pharmacy

## 2019-11-06 NOTE — Telephone Encounter (Addendum)
Form filled out and placed in providers folder for signature.  

## 2019-11-06 NOTE — Telephone Encounter (Signed)
Pharm is calling and pt needs new rx one touch softclick lancets

## 2019-11-10 ENCOUNTER — Encounter: Payer: Self-pay | Admitting: Nurse Practitioner

## 2019-11-10 ENCOUNTER — Ambulatory Visit (INDEPENDENT_AMBULATORY_CARE_PROVIDER_SITE_OTHER): Payer: Medicare Other | Admitting: Nurse Practitioner

## 2019-11-10 ENCOUNTER — Other Ambulatory Visit: Payer: Self-pay

## 2019-11-10 VITALS — BP 136/74 | HR 71 | Temp 97.4°F

## 2019-11-10 DIAGNOSIS — E1149 Type 2 diabetes mellitus with other diabetic neurological complication: Secondary | ICD-10-CM

## 2019-11-10 LAB — BAYER DCA HB A1C WAIVED: HB A1C (BAYER DCA - WAIVED): 5.6 % (ref <7.0)

## 2019-11-10 NOTE — Assessment & Plan Note (Signed)
Chronic, stable with recent A1C 5.6% and urine ALB 30 with A:C 30-300.  Due to advanced age and risk for falls, will recheck A1C today and if remains < 7 with tight control will discontinue Glipizide.  Recommend he monitor BS in morning three times a week, discussed goals with him.  Will have return in 3 months for follow-up.  Concern for hypoglycemia and falls. Goal A1C for age <8.  If A1C elevations without Glipizide could consider Jardiance or Farxiga at low dose.

## 2019-11-10 NOTE — Progress Notes (Signed)
BP 136/74 (BP Location: Left Arm, Patient Position: Sitting, Cuff Size: Normal)   Pulse 71   Temp (!) 97.4 F (36.3 C) (Oral)   SpO2 99%    Subjective:    Patient ID: Jeffery Horne, male    DOB: 04-08-1927, 86 y.o.   MRN: [id number]  HPI: Jeffery Horne is a 86 y.o. male  Chief Complaint  Patient presents with  . Diabetes   DIABETES Last visit A1C tightly controlled at 5.6%. had instructed him to reduce his Glipizide 5 MG (as was noted in chart he was taking) to 2.5 MG.  He states when he got home his pill bottle reported he was taking Glipizide 2.5 MG, has continued to take this.  Asks to check A1C again today.   Hypoglycemic episodes:no Polydipsia/polyuria: no Visual disturbance: no Chest pain: no Paresthesias: no Glucose Monitoring: no  Accucheck frequency: Not Checking  Fasting glucose: was not checking previous visit, although reports he has checked a few mornings ones since last visit and they have been < 130.  Post prandial:  Evening:  Before meals: Taking Insulin?: no  Long acting insulin:  Short acting insulin: Blood Pressure Monitoring: monthly Retinal Examination: Not up to Date Foot Exam: Up to Date Pneumovax: Up to Date Influenza: refuses Aspirin: yes  Relevant past medical, surgical, family and social history reviewed and updated as indicated. Interim medical history since our last visit reviewed. Allergies and medications reviewed and updated.  Review of Systems  Constitutional: Negative for activity change, diaphoresis, fatigue and fever.  Respiratory: Negative for cough, chest tightness, shortness of breath and wheezing.   Cardiovascular: Negative for chest pain, palpitations and leg swelling.  Gastrointestinal: Negative.   Endocrine: Negative for cold intolerance, heat intolerance, polydipsia, polyphagia and polyuria.  Neurological: Negative.   Psychiatric/Behavioral: Negative.     Per HPI unless specifically indicated above       Objective:    BP 136/74 (BP Location: Left Arm, Patient Position: Sitting, Cuff Size: Normal)   Pulse 71   Temp (!) 97.4 F (36.3 C) (Oral)   SpO2 99%   Wt Readings from Last 3 Encounters:  10/13/19 177 lb 12.8 oz (80.6 kg)  10/06/18 186 lb 1.6 oz (84.4 kg)  03/20/18 182 lb 1.6 oz (82.6 kg)    Physical Exam Vitals and nursing note reviewed.  Constitutional:      General: He is awake. He is not in acute distress.    Appearance: He is well-developed. He is not ill-appearing.  HENT:     Head: Normocephalic and atraumatic.     Right Ear: Hearing normal. No drainage.     Left Ear: Hearing normal. No drainage.  Eyes:     General: Lids are normal.        Right eye: No discharge.        Left eye: No discharge.     Conjunctiva/sclera: Conjunctivae normal.     Pupils: Pupils are equal, round, and reactive to light.  Neck:     Vascular: No carotid bruit.  Cardiovascular:     Rate and Rhythm: Normal rate and regular rhythm.     Heart sounds: Normal heart sounds, S1 normal and S2 normal. No murmur. No gallop.   Pulmonary:     Effort: Pulmonary effort is normal. No accessory muscle usage or respiratory distress.     Breath sounds: Normal breath sounds.  Abdominal:     General: Bowel sounds are normal.     Palpations: Abdomen is  soft. There is no hepatomegaly or splenomegaly.  Musculoskeletal:        General: Normal range of motion.     Cervical back: Normal range of motion and neck supple.     Right lower leg: No edema.     Left lower leg: No edema.  Skin:    General: Skin is warm and dry.     Capillary Refill: Capillary refill takes less than 2 seconds.  Neurological:     Mental Status: He is alert and oriented to person, place, and time.  Psychiatric:        Attention and Perception: Attention normal.        Mood and Affect: Mood normal.        Speech: Speech normal.        Behavior: Behavior normal. Behavior is cooperative.        Thought Content: Thought content normal.      Results for orders placed or performed in visit on 10/13/19  Bayer DCA Hb A1c Waived  Result Value Ref Range   HB A1C (BAYER DCA - WAIVED) 5.6 <7.0 %  Microalbumin, Urine Waived  Result Value Ref Range   Microalb, Ur Waived 30 (H) 0 - 19 mg/L   Creatinine, Urine Waived 50 10 - 300 mg/dL   Microalb/Creat Ratio 30-300 (H) <30 mg/g  Comprehensive metabolic panel  Result Value Ref Range   Glucose 90 65 - 99 mg/dL   BUN 9 (L) 10 - 36 mg/dL   Creatinine, Ser 0.62 0.76 - 1.27 mg/dL   GFR calc non Af Amer 78 >59 mL/min/1.73   GFR calc Af Amer 90 >59 mL/min/1.73   BUN/Creatinine Ratio 11 10 - 24   Sodium 147 (H) 134 - 144 mmol/L   Potassium 5.1 3.5 - 5.2 mmol/L   Chloride 108 (H) 96 - 106 mmol/L   CO2 29 20 - 29 mmol/L   Calcium 8.8 8.6 - 10.2 mg/dL   Total Protein 6.4 6.0 - 8.5 g/dL   Albumin 3.8 3.5 - 4.6 g/dL   Globulin, Total 2.6 1.5 - 4.5 g/dL   Albumin/Globulin Ratio 1.5 1.2 - 2.2   Bilirubin Total 1.0 0.0 - 1.2 mg/dL   Alkaline Phosphatase 88 39 - 117 IU/L   AST 15 0 - 40 IU/L   ALT 14 0 - 44 IU/L  CBC with Differential/Platelet  Result Value Ref Range   WBC 6.7 3.4 - 10.8 x10E3/uL   RBC 3.95 (L) 4.14 - 5.80 x10E6/uL   Hemoglobin 12.5 (L) 13.0 - 17.7 g/dL   Hematocrit 69.4 85.4 - 51.0 %   MCV 95 79 - 97 fL   MCH 31.6 26.6 - 33.0 pg   MCHC 33.2 31.5 - 35.7 g/dL   RDW 62.7 03.5 - 00.9 %   Platelets 222 150 - 450 x10E3/uL   Neutrophils 52 Not Estab. %   Lymphs 17 Not Estab. %   Monocytes 9 Not Estab. %   Eos 21 Not Estab. %   Basos 1 Not Estab. %   Neutrophils Absolute 3.5 1.4 - 7.0 x10E3/uL   Lymphocytes Absolute 1.1 0.7 - 3.1 x10E3/uL   Monocytes Absolute 0.6 0.1 - 0.9 x10E3/uL   EOS (ABSOLUTE) 1.4 (H) 0.0 - 0.4 x10E3/uL   Basophils Absolute 0.1 0.0 - 0.2 x10E3/uL   Immature Granulocytes 0 Not Estab. 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  Due to advanced age and risk for falls, will recheck A1C today and if remains < 7 with tight control will discontinue Glipizide.  Recommend he monitor BS in morning three times a week, discussed goals with him.  Will have return in 3 months for follow-up.  Concern for hypoglycemia and falls. Goal A1C for age <8.  If A1C elevations without Glipizide could consider Jardiance or Farxiga at low dose.        Relevant Orders   Bayer DCA Hb A1c Waived       Follow up plan: Return in about 3 months (around 02/10/2020) for T2DM, HTN/HLD.

## 2019-11-10 NOTE — Patient Instructions (Signed)
FASTING blood sugar goal is < 130 2 hours after you eat blood sugar goal is < 180   Diabetes Mellitus and Nutrition, Adult When you have diabetes (diabetes mellitus), it is very important to have healthy eating habits because your blood sugar (glucose) levels are greatly affected by what you eat and drink. Eating healthy foods in the appropriate amounts, at about the same times every day, can help you:  Control your blood glucose.  Lower your risk of heart disease.  Improve your blood pressure.  Reach or maintain a healthy weight. Every person with diabetes is different, and each person has different needs for a meal plan. Your health care provider may recommend that you work with a diet and nutrition specialist (dietitian) to make a meal plan that is best for you. Your meal plan may vary depending on factors such as:  The calories you need.  The medicines you take.  Your weight.  Your blood glucose, blood pressure, and cholesterol levels.  Your activity level.  Other health conditions you have, such as heart or kidney disease. How do carbohydrates affect me? Carbohydrates, also called carbs, affect your blood glucose level more than any other type of food. Eating carbs naturally raises the amount of glucose in your blood. Carb counting is a method for keeping track of how many carbs you eat. Counting carbs is important to keep your blood glucose at a healthy level, especially if you use insulin or take certain oral diabetes medicines. It is important to know how many carbs you can safely have in each meal. This is different for every person. Your dietitian can help you calculate how many carbs you should have at each meal and for each snack. Foods that contain carbs include:  Bread, cereal, rice, pasta, and crackers.  Potatoes and corn.  Peas, beans, and lentils.  Milk and yogurt.  Fruit and juice.  Desserts, such as cakes, cookies, ice cream, and candy. How does alcohol  affect me? Alcohol can cause a sudden decrease in blood glucose (hypoglycemia), especially if you use insulin or take certain oral diabetes medicines. Hypoglycemia can be a life-threatening condition. Symptoms of hypoglycemia (sleepiness, dizziness, and confusion) are similar to symptoms of having too much alcohol. If your health care provider says that alcohol is safe for you, follow these guidelines:  Limit alcohol intake to no more than 1 drink per day for nonpregnant women and 2 drinks per day for men. One drink equals 12 oz of beer, 5 oz of wine, or 1 oz of hard liquor.  Do not drink on an empty stomach.  Keep yourself hydrated with water, diet soda, or unsweetened iced tea.  Keep in mind that regular soda, juice, and other mixers may contain a lot of sugar and must be counted as carbs. What are tips for following this plan?  Reading food labels  Start by checking the serving size on the "Nutrition Facts" label of packaged foods and drinks. The amount of calories, carbs, fats, and other nutrients listed on the label is based on one serving of the item. Many items contain more than one serving per package.  Check the total grams (g) of carbs in one serving. You can calculate the number of servings of carbs in one serving by dividing the total carbs by 15. For example, if a food has 30 g of total carbs, it would be equal to 2 servings of carbs.  Check the number of grams (g) of saturated and trans  fats in one serving. Choose foods that have low or no amount of these fats.  Check the number of milligrams (mg) of salt (sodium) in one serving. Most people should limit total sodium intake to less than 2,300 mg per day.  Always check the nutrition information of foods labeled as "low-fat" or "nonfat". These foods may be higher in added sugar or refined carbs and should be avoided.  Talk to your dietitian to identify your daily goals for nutrients listed on the label. Shopping  Avoid buying  canned, premade, or processed foods. These foods tend to be high in fat, sodium, and added sugar.  Shop around the outside edge of the grocery store. This includes fresh fruits and vegetables, bulk grains, fresh meats, and fresh dairy. Cooking  Use low-heat cooking methods, such as baking, instead of high-heat cooking methods like deep frying.  Cook using healthy oils, such as olive, canola, or sunflower oil.  Avoid cooking with butter, cream, or high-fat meats. Meal planning  Eat meals and snacks regularly, preferably at the same times every day. Avoid going long periods of time without eating.  Eat foods high in fiber, such as fresh fruits, vegetables, beans, and whole grains. Talk to your dietitian about how many servings of carbs you can eat at each meal.  Eat 4-6 ounces (oz) of lean protein each day, such as lean meat, chicken, fish, eggs, or tofu. One oz of lean protein is equal to: ? 1 oz of meat, chicken, or fish. ? 1 egg. ?  cup of tofu.  Eat some foods each day that contain healthy fats, such as avocado, nuts, seeds, and fish. Lifestyle  Check your blood glucose regularly.  Exercise regularly as told by your health care provider. This may include: ? 150 minutes of moderate-intensity or vigorous-intensity exercise each week. This could be brisk walking, biking, or water aerobics. ? Stretching and doing strength exercises, such as yoga or weightlifting, at least 2 times a week.  Take medicines as told by your health care provider.  Do not use any products that contain nicotine or tobacco, such as cigarettes and e-cigarettes. If you need help quitting, ask your health care provider.  Work with a Veterinary surgeon or diabetes educator to identify strategies to manage stress and any emotional and social challenges. Questions to ask a health care provider  Do I need to meet with a diabetes educator?  Do I need to meet with a dietitian?  What number can I call if I have  questions?  When are the best times to check my blood glucose? Where to find more information:  American Diabetes Association: diabetes.org  Academy of Nutrition and Dietetics: www.eatright.[employer] of Diabetes and Digestive and Kidney Diseases (NIH): CarFli[employer].tn Summary  A healthy meal plan will help you control your blood glucose and maintain a healthy lifestyle.  Working with a diet and nutrition specialist (dietitian) can help you make a meal plan that is best for you.  Keep in mind that carbohydrates (carbs) and alcohol have immediate effects on your blood glucose levels. It is important to count carbs and to use alcohol carefully. This information is not intended to replace advice given to you by your health care provider. Make sure you discuss any questions you have with your health care provider. Document Revised: 08/09/2017 Document Reviewed: 10/01/2016 Elsevier Patient Education  2020 [employer].

## 2019-11-16 ENCOUNTER — Other Ambulatory Visit: Payer: Self-pay | Admitting: Nurse Practitioner

## 2019-11-16 DIAGNOSIS — E7800 Pure hypercholesterolemia, unspecified: Secondary | ICD-10-CM

## 2019-11-16 DIAGNOSIS — N138 Other obstructive and reflux uropathy: Secondary | ICD-10-CM

## 2019-11-16 DIAGNOSIS — I10 Essential (primary) hypertension: Secondary | ICD-10-CM

## 2019-11-16 MED ORDER — ENALAPRIL MALEATE 5 MG PO TABS: 5.0000 mg | ORAL_TABLET | Freq: Every day | ORAL | 4 refills | Status: DC

## 2019-11-16 MED ORDER — TAMSULOSIN HCL 0.4 MG PO CAPS: 0.4000 mg | ORAL_CAPSULE | Freq: Every day | ORAL | 4 refills | Status: DC

## 2019-11-16 MED ORDER — METOPROLOL TARTRATE 25 MG PO TABS: 25.0000 mg | ORAL_TABLET | Freq: Every day | ORAL | 4 refills | Status: DC

## 2019-11-16 MED ORDER — GLIPIZIDE 5 MG PO TABS: 2.5000 mg | ORAL_TABLET | Freq: Every day | ORAL | 3 refills | Status: DC

## 2019-11-16 MED ORDER — SIMVASTATIN 20 MG PO TABS: 20.0000 mg | ORAL_TABLET | Freq: Every day | ORAL | 4 refills | Status: DC

## 2019-11-16 NOTE — Telephone Encounter (Signed)
Pt is here in office stating he needs a 90 day supply of tamsulonsin 0.4 mg  Metoprolol,simvastain,glipizide and enalapril. Pt would like it to be sent to [facility] in [place] states 90 day is cheaper. Pt would like a call when this is sent. Please advise.

## 2019-11-16 NOTE — Telephone Encounter (Signed)
Patient last seen 11/10/19 and has a f/up 02/10/20.

## 2019-12-21 ENCOUNTER — Ambulatory Visit (INDEPENDENT_AMBULATORY_CARE_PROVIDER_SITE_OTHER): Payer: Medicare Other

## 2019-12-21 ENCOUNTER — Other Ambulatory Visit: Payer: Self-pay

## 2019-12-21 VITALS — BP 124/66 | HR 62 | Temp 97.8°F | Ht 69.0 in | Wt 178.0 lb

## 2019-12-21 DIAGNOSIS — Z0000 Encounter for general adult medical examination without abnormal findings: Secondary | ICD-10-CM | POA: Diagnosis not present

## 2019-12-21 NOTE — Progress Notes (Signed)
Subjective:   Jeffery Horne is a 86 y.o. male who presents for Medicare Annual/Subsequent preventive examination.  Review of Systems:   Cardiac Risk Factors include: advanced age (>68men, >52 women);male gender;hypertension;dyslipidemia     Objective:    Vitals: BP 124/66 (BP Location: Left Arm, Patient Position: Sitting, Cuff Size: Normal)   Pulse 62   Temp 97.8 F (36.6 C) (Temporal)   Ht 5\' 9"  (1.753 m)   Wt 178 lb (80.7 kg)   SpO2 94%   BMI 26.29 kg/m   Body mass index is 26.29 kg/m.  Advanced Directives 12/21/2019 01/16/2018 09/13/2017  Does Patient Have a Medical Advance Directive? No No No  Does patient want to make changes to medical advance directive? No - Patient declined - No - Patient declined  Would patient like information on creating a medical advance directive? - No - Patient declined -    Tobacco Social History   Tobacco Use  Smoking Status Former Smoker  . Types: Cigarettes  . Quit date: 09/11/1971  . Years since quitting: 48.3  Smokeless Tobacco Never Used     Counseling given: Not Answered   Clinical Intake:  Pre-visit preparation completed: Yes  Pain : No/denies pain     Nutritional Status: BMI 25 -29 Overweight Nutritional Risks: None Diabetes: No  How often do you need to have someone help you when you read instructions, pamphlets, or other written materials from your doctor or pharmacy?: 1 - Never  Interpreter Needed?: No  Information entered by :: Beatrice Sehgal,LPN  Past Medical History:  Diagnosis Date  . BPH (benign prostatic hypertrophy) with urinary obstruction   . Hearing impairment   . Hyperlipidemia   . Hypertension   . Peripheral vascular disease (HCC)   . Primary gout   . Type II diabetes mellitus with neurological manifestations [facility])    Past Surgical History:  Procedure Laterality Date  . HERNIA REPAIR    . PACEMAKER INSERTION     [facility]   Family History  Problem Relation Age of Onset  . Diabetes Mother    . Hypertension Mother   . Stroke Mother   . Hypertension Father    Social History   Socioeconomic History  . Marital status: Widowed    Spouse name: Not on file  . Number of children: Not on file  . Years of education: Not on file  . Highest education level: GED or equivalent  Occupational History  . Not on file  Tobacco Use  . Smoking status: Former Smoker    Types: Cigarettes    Quit date: 09/11/1971    Years since quitting: 48.3  . Smokeless tobacco: Never Used  Substance and Sexual Activity  . Alcohol use: No  . Drug use: No  . Sexual activity: Not on file  Other Topics Concern  . Not on file  Social History Narrative  . Not on file   Social Determinants of Health   Financial Resource Strain: Low Risk   . Difficulty of Paying Living Expenses: Not hard at all  Food Insecurity: No Food Insecurity  . Worried About 11/09/1971 in the Last Year: Never true  . Ran Out of Food in the Last Year: Never true  Transportation Needs: No Transportation Needs  . Lack of Transportation (Medical): No  . Lack of Transportation (Non-Medical): No  Physical Activity: Insufficiently Active  . Days of Exercise per Week: 1 day  . Minutes of Exercise per Session: 60 min  Stress:   . Feeling of Stress :   Social Connections: Somewhat Isolated  . Frequency of Communication with Friends and Family: Three times a week  . Frequency of Social Gatherings with Friends and Family: Three times a week  . Attends Religious Services: More than 4 times per year  . Active Member of Clubs or Organizations: No  . Attends Banker Meetings: Never  . Marital Status: Widowed    Outpatient Encounter Medications as of 12/21/2019  Medication Sig  . aspirin EC 81 MG tablet Take 81 mg by mouth daily.  . clopidogrel (PLAVIX) 75 MG tablet Take 75 mg by mouth daily.  . enalapril (VASOTEC) 5 MG tablet Take 1 tablet (5 mg total) by mouth daily.  . fluticasone (FLONASE) 50 MCG/ACT nasal  spray Place 2 sprays into both nostrils daily.  Marland Kitchen gabapentin (NEURONTIN) 300 MG capsule Take 1 capsule (300 mg total) by mouth 3 (three) times daily.  Marland Kitchen glipiZIDE (GLUCOTROL) 5 MG tablet Take 0.5 tablets (2.5 mg total) by mouth daily before breakfast.  . glucose blood test strip For testing blood sugar twice per day.  . metoprolol tartrate (LOPRESSOR) 25 MG tablet Take 1 tablet (25 mg total) by mouth daily.  . simvastatin (ZOCOR) 20 MG tablet Take 1 tablet (20 mg total) by mouth daily.  . tamsulosin (FLOMAX) 0.4 MG CAPS capsule Take 1 capsule (0.4 mg total) by mouth daily.   No facility-administered encounter medications on file as of 12/21/2019.    Activities of Daily Living In your present state of health, do you have any difficulty performing the following activities: 12/21/2019 10/13/2019  Hearing? Y Y  Comment hearing aids -  Vision? N N  Comment eyeglasses, [facility] -  Difficulty concentrating or making decisions? N N  Walking or climbing stairs? N Y  Dressing or bathing? N N  Doing errands, shopping? N N  Preparing Food and eating ? N -  Using the Toilet? N -  In the past six months, have you accidently leaked urine? N -  Do you have problems with loss of bowel control? N -  Managing your Medications? N -  Managing your Finances? N -  Housekeeping or managing your Housekeeping? N -  Some recent data might be hidden    Patient Care Team: Marjie Skiff, NP as PCP - General (Nurse Practitioner) Corky Downs, MD as Referring Physician (Cardiology) Gwyneth Revels, DPM as Referring Physician (Podiatry) Vernie Murders, MD (Otolaryngology) Rayann Heman, DC as Referring Physician (Chiropractic Medicine)   Assessment:   This is a routine wellness examination for [place].  Exercise Activities and Dietary recommendations Current Exercise Habits: Home exercise routine, Type of exercise: stretching;strength training/weights(PT once a week. exercise some at home and stay  active.), Time (Minutes): 30, Frequency (Times/Week): 3, Weekly Exercise (Minutes/Week): 90, Intensity: Mild, Exercise limited by: None identified  Goals Addressed   None     Fall Risk: Fall Risk  12/21/2019 10/13/2019 10/06/2018 10/01/2017 09/13/2017  Falls in the past year? 1 1 1  No No  Number falls in past yr: 1 1 0 - -  Injury with Fall? 0 0 0 - -  Risk for fall due to : Impaired balance/gait - Impaired balance/gait - -  Follow up - Falls evaluation completed Falls evaluation completed - -    FALL RISK PREVENTION PERTAINING TO THE HOME:  Any stairs in or around the home? Yes  steps going in carport  If so, are there any without handrails?  No   Home free of loose throw rugs in walkways, pet beds, electrical cords, etc? Yes  Adequate lighting in your home to reduce risk of falls? Yes   ASSISTIVE DEVICES UTILIZED TO PREVENT FALLS:  Life alert? No  Use of a cane, walker or w/c? Yes  Grab bars in the bathroom? Yes  Shower chair or bench in shower? Yes  Elevated toilet seat or a handicapped toilet? Yes   TIMED UP AND GO:  Was the test performed? Yes .  Length of time to ambulate 10 feet: 11 sec.   GAIT:  Appearance of gait: Gait steady and slow  with the use of an assistive device.  Education: Fall risk prevention has been discussed.  Intervention(s) required? No  DME/home health order needed?  No   Depression Screen PHQ 2/9 Scores 12/21/2019 10/13/2019 10/06/2018 10/01/2017  PHQ - 2 Score 0 0 0 0  PHQ- 9 Score - - 0 -    Cognitive Function     6CIT Screen 10/13/2019 09/13/2017  What Year? 0 points 0 points  What month? 0 points 0 points  What time? 0 points 0 points  Count back from 20 0 points 0 points  Months in reverse 0 points 0 points  Repeat phrase 0 points 0 points  Total Score 0 0    Immunization History  Administered Date(s) Administered  . PFIZER SARS-COV-2 Vaccination 10/30/2019, 11/20/2019  . Pneumococcal Conjugate-13 04/30/2014  .  Pneumococcal-Unspecified 02/11/2001  . Td 04/15/2006    Qualifies for Shingles Vaccine? Yes  Zostavax completed n/a. Due for Shingrix. Education has been provided regarding the importance of this vaccine. Pt has been advised to call insurance company to determine out of pocket expense. Advised may also receive vaccine at local pharmacy or Health Dept. Verbalized acceptance and understanding.  Tdap: Discussed need for TD/TDAP vaccine, patient verbalized understanding that this is not covered as a preventative with there insurance and to call the office if he develops any new skin injuries, ie: cuts, scrapes, bug bites, or open wounds.   Flu Vaccine: declined  Pneumococcal Vaccine: up to date   Covid-19 Vaccine:  Completed vaccines  Screening Tests Health Maintenance  Topic Date Due  . OPHTHALMOLOGY EXAM  01/02/2019  . INFLUENZA VACCINE  04/10/2020  . HEMOGLOBIN A1C  05/12/2020  . FOOT EXAM  10/12/2020  . PNA vac Low Risk Adult  Completed  . TETANUS/TDAP  Discontinued   Cancer Screenings:  Colorectal Screening: no longer required   Lung Cancer Screening: (Low Dose CT Chest recommended if Age 24-80 years, 30 pack-year currently smoking OR have quit w/in 15years.) does not qualify.    Additional Screening:  Hepatitis C Screening: does not qualify  Vision Screening: Recommended annual ophthalmology exams for early detection of glaucoma and other disorders of the eye. Is the patient up to date with their annual eye exam?  Yes  Who is the provider or what is the name of the office in which the pt attends annual eye exams? [facility]    Dental Screening: Recommended annual dental exams for proper oral hygiene  Community Resource Referral:  CRR required this visit?  No        Plan:  I have personally reviewed and addressed the Medicare Annual Wellness questionnaire and have noted the following in the patient's chart:  A. Medical and social history B. Use of alcohol, tobacco or  illicit drugs  C. Current medications and supplements D. Functional ability and status E.  Nutritional status  F.  Physical activity G. Advance directives H. List of other physicians I.  Hospitalizations, surgeries, and ER visits in previous 12 months J.  Vitals K. Screenings such as hearing and vision if needed, cognitive and depression L. Referrals and appointments   In addition, I have reviewed and discussed with patient certain preventive protocols, quality metrics, and best practice recommendations. A written personalized care plan for preventive services as well as general preventive health recommendations were provided to patient.   Signed,   Collene Schlichter, LPN  12/17/8117 Nurse Health Advisor   Nurse Notes: none

## 2019-12-21 NOTE — Patient Instructions (Addendum)
Jeffery Horne , Thank you for taking time to come for your Medicare Wellness Visit. I appreciate your ongoing commitment to your health goals. Please review the following plan we discussed and let me know if I can assist you in the future.   Screening recommendations/referrals: Colonoscopy: no longer required  Recommended yearly ophthalmology/optometry visit for glaucoma screening and checkup Recommended yearly dental visit for hygiene and checkup  Vaccinations: Influenza vaccine: up to date  Pneumococcal vaccine: up to date  Tdap vaccine: up to date  Shingles vaccine: shingrix eligible    Covid-19: completed series   Advanced directives: Advance directive discussed with you today. Even though you declined this today please call our office should you change your mind and we can give you the proper paperwork for you to fill out.  Conditions/risks identified: diabetic, discussed chronic care management team   Next appointment: Follow up in one year for your annual wellness visit.   Preventive Care 84 Years and Older, Male Preventive care refers to lifestyle choices and visits with your health care provider that can promote health and wellness. What does preventive care include?  A yearly physical exam. This is also called an annual well check.  Dental exams once or twice a year.  Routine eye exams. Ask your health care provider how often you should have your eyes checked.  Personal lifestyle choices, including:  Daily care of your teeth and gums.  Regular physical activity.  Eating a healthy diet.  Avoiding tobacco and drug use.  Limiting alcohol use.  Practicing safe sex.  Taking low doses of aspirin every day.  Taking vitamin and mineral supplements as recommended by your health care provider. What happens during an annual well check? The services and screenings done by your health care provider during your annual well check will depend on your age, overall health,  lifestyle risk factors, and family history of disease. Counseling  Your health care provider may ask you questions about your:  Alcohol use.  Tobacco use.  Drug use.  Emotional well-being.  Home and relationship well-being.  Sexual activity.  Eating habits.  History of falls.  Memory and ability to understand (cognition).  Work and work Astronomer. Screening  You may have the following tests or measurements:  Height, weight, and BMI.  Blood pressure.  Lipid and cholesterol levels. These may be checked every 5 years, or more frequently if you are over 84 years old.  Skin check.  Lung cancer screening. You may have this screening every year starting at age 84 if you have a 30-pack-year history of smoking and currently smoke or have quit within the past 15 years.  Fecal occult blood test (FOBT) of the stool. You may have this test every year starting at age 84.  Flexible sigmoidoscopy or colonoscopy. You may have a sigmoidoscopy every 5 years or a colonoscopy every 10 years starting at age 84.  Prostate cancer screening. Recommendations will vary depending on your family history and other risks.  Hepatitis C blood test.  Hepatitis B blood test.  Sexually transmitted disease (STD) testing.  Diabetes screening. This is done by checking your blood sugar (glucose) after you have not eaten for a while (fasting). You may have this done every 1-3 years.  Abdominal aortic aneurysm (AAA) screening. You may need this if you are a current or former smoker.  Osteoporosis. You may be screened starting at age 84 if you are at high risk. Talk with your health care provider about your test  results, treatment options, and if necessary, the need for more tests. Vaccines  Your health care provider may recommend certain vaccines, such as:  Influenza vaccine. This is recommended every year.  Tetanus, diphtheria, and acellular pertussis (Tdap, Td) vaccine. You may need a Td booster  every 10 years.  Zoster vaccine. You may need this after age 27.  Pneumococcal 13-valent conjugate (PCV13) vaccine. One dose is recommended after age 65.  Pneumococcal polysaccharide (PPSV23) vaccine. One dose is recommended after age 30. Talk to your health care provider about which screenings and vaccines you need and how often you need them. This information is not intended to replace advice given to you by your health care provider. Make sure you discuss any questions you have with your health care provider. Document Released: 09/23/2015 Document Revised: 05/16/2016 Document Reviewed: 06/28/2015 Elsevier Interactive Patient Education  2017 [place] Prevention in the Home Falls can cause injuries. They can happen to people of all ages. There are many things you can do to make your home safe and to help prevent falls. What can I do on the outside of my home?  Regularly fix the edges of walkways and driveways and fix any cracks.  Remove anything that might make you trip as you walk through a door, such as a raised step or threshold.  Trim any bushes or trees on the path to your home.  Use bright outdoor lighting.  Clear any walking paths of anything that might make someone trip, such as rocks or tools.  Regularly check to see if handrails are loose or broken. Make sure that both sides of any steps have handrails.  Any raised decks and porches should have guardrails on the edges.  Have any leaves, snow, or ice cleared regularly.  Use sand or salt on walking paths during winter.  Clean up any spills in your garage right away. This includes oil or grease spills. What can I do in the bathroom?  Use night lights.  Install grab bars by the toilet and in the tub and shower. Do not use towel bars as grab bars.  Use non-skid mats or decals in the tub or shower.  If you need to sit down in the shower, use a plastic, non-slip stool.  Keep the floor dry. Clean up any  water that spills on the floor as soon as it happens.  Remove soap buildup in the tub or shower regularly.  Attach bath mats securely with double-sided non-slip rug tape.  Do not have throw rugs and other things on the floor that can make you trip. What can I do in the bedroom?  Use night lights.  Make sure that you have a light by your bed that is easy to reach.  Do not use any sheets or blankets that are too big for your bed. They should not hang down onto the floor.  Have a firm chair that has side arms. You can use this for support while you get dressed.  Do not have throw rugs and other things on the floor that can make you trip. What can I do in the kitchen?  Clean up any spills right away.  Avoid walking on wet floors.  Keep items that you use a lot in easy-to-reach places.  If you need to reach something above you, use a strong step stool that has a grab bar.  Keep electrical cords out of the way.  Do not use floor polish or wax that makes  floors slippery. If you must use wax, use non-skid floor wax.  Do not have throw rugs and other things on the floor that can make you trip. What can I do with my stairs?  Do not leave any items on the stairs.  Make sure that there are handrails on both sides of the stairs and use them. Fix handrails that are broken or loose. Make sure that handrails are as long as the stairways.  Check any carpeting to make sure that it is firmly attached to the stairs. Fix any carpet that is loose or worn.  Avoid having throw rugs at the top or bottom of the stairs. If you do have throw rugs, attach them to the floor with carpet tape.  Make sure that you have a light switch at the top of the stairs and the bottom of the stairs. If you do not have them, ask someone to add them for you. What else can I do to help prevent falls?  Wear shoes that:  Do not have high heels.  Have rubber bottoms.  Are comfortable and fit you well.  Are closed  at the toe. Do not wear sandals.  If you use a stepladder:  Make sure that it is fully opened. Do not climb a closed stepladder.  Make sure that both sides of the stepladder are locked into place.  Ask someone to hold it for you, if possible.  Clearly mark and make sure that you can see:  Any grab bars or handrails.  First and last steps.  Where the edge of each step is.  Use tools that help you move around (mobility aids) if they are needed. These include:  Canes.  Walkers.  Scooters.  Crutches.  Turn on the lights when you go into a dark area. Replace any light bulbs as soon as they burn out.  Set up your furniture so you have a clear path. Avoid moving your furniture around.  If any of your floors are uneven, fix them.  If there are any pets around you, be aware of where they are.  Review your medicines with your doctor. Some medicines can make you feel dizzy. This can increase your chance of falling. Ask your doctor what other things that you can do to help prevent falls. This information is not intended to replace advice given to you by your health care provider. Make sure you discuss any questions you have with your health care provider. Document Released: 06/23/2009 Document Revised: 02/02/2016 Document Reviewed: 10/01/2014 Elsevier Interactive Patient Education  2017 [employer].

## 2020-01-14 ENCOUNTER — Other Ambulatory Visit: Payer: Self-pay

## 2020-01-14 ENCOUNTER — Ambulatory Visit (INDEPENDENT_AMBULATORY_CARE_PROVIDER_SITE_OTHER): Payer: Medicare Other | Admitting: Internal Medicine

## 2020-01-14 VITALS — BP 118/61 | HR 68

## 2020-01-14 DIAGNOSIS — R269 Unspecified abnormalities of gait and mobility: Secondary | ICD-10-CM

## 2020-01-14 DIAGNOSIS — E1149 Type 2 diabetes mellitus with other diabetic neurological complication: Secondary | ICD-10-CM | POA: Diagnosis not present

## 2020-01-14 DIAGNOSIS — Z950 Presence of cardiac pacemaker: Secondary | ICD-10-CM | POA: Diagnosis not present

## 2020-01-14 DIAGNOSIS — I495 Sick sinus syndrome: Secondary | ICD-10-CM

## 2020-01-18 ENCOUNTER — Encounter: Payer: Self-pay | Admitting: Internal Medicine

## 2020-01-18 NOTE — Progress Notes (Addendum)
Established Patient Office Visit  Subjective:  Patient ID: Jeffery Horne, male    DOB: [birth year]  Age: 86 y.o. MRN: [id number]  CC:  Chief Complaint  Patient presents with  . Pacemaker Check    HPI  Jeffery Horne presents for came for pacemaker check.  Patient glipizide was discontinued because of the concern for hypoglycemia/lives with his family and came on the wheelchair he has been told to do some exercising on a chair  Past Medical History:  Diagnosis Date  . BPH (benign prostatic hypertrophy) with urinary obstruction   . Hearing impairment   . Hyperlipidemia   . Hypertension   . Peripheral vascular disease ([place])   . Primary gout   . Type II diabetes mellitus with neurological manifestations [facility])     Past Surgical History:  Procedure Laterality Date  . HERNIA REPAIR    . PACEMAKER INSERTION     Dr. Lavera Guise    Family History  Problem Relation Age of Onset  . Diabetes Mother   . Hypertension Mother   . Stroke Mother   . Hypertension Father     Social History   Socioeconomic History  . Marital status: Widowed    Spouse name: Not on file  . Number of children: Not on file  . Years of education: Not on file  . Highest education level: GED or equivalent  Occupational History  . Not on file  Tobacco Use  . Smoking status: Former Smoker    Types: Cigarettes    Quit date: 09/11/1971    Years since quitting: 48.3  . Smokeless tobacco: Never Used  Substance and Sexual Activity  . Alcohol use: No  . Drug use: No  . Sexual activity: Not on file  Other Topics Concern  . Not on file  Social History Narrative  . Not on file   Social Determinants of Health   Financial Resource Strain: Low Risk   . Difficulty of Paying Living Expenses: Not hard at all  Food Insecurity: No Food Insecurity  . Worried About Charity fundraiser in the Last Year: Never true  . Ran Out of Food in the Last Year: Never true  Transportation Needs: No Transportation Needs  .  Lack of Transportation (Medical): No  . Lack of Transportation (Non-Medical): No  Physical Activity: Insufficiently Active  . Days of Exercise per Week: 1 day  . Minutes of Exercise per Session: 60 min  Stress:   . Feeling of Stress :   Social Connections: Somewhat Isolated  . Frequency of Communication with Friends and Family: Three times a week  . Frequency of Social Gatherings with Friends and Family: Three times a week  . Attends Religious Services: More than 4 times per year  . Active Member of Clubs or Organizations: No  . Attends Archivist Meetings: Never  . Marital Status: Widowed  Intimate Partner Violence:   . Fear of Current or Ex-Partner:   . Emotionally Abused:   Marland Kitchen Physically Abused:   . Sexually Abused:      Current Outpatient Medications:  .  aspirin EC 81 MG tablet, Take 81 mg by mouth daily., Disp: , Rfl:  .  clopidogrel (PLAVIX) 75 MG tablet, Take 75 mg by mouth daily., Disp: , Rfl: 7 .  enalapril (VASOTEC) 5 MG tablet, Take 1 tablet (5 mg total) by mouth daily., Disp: 90 tablet, Rfl: 4 .  fluticasone (FLONASE) 50 MCG/ACT nasal spray, Place 2 sprays into both nostrils  daily., Disp: 16 g, Rfl: 12 .  gabapentin (NEURONTIN) 300 MG capsule, Take 1 capsule (300 mg total) by mouth 3 (three) times daily., Disp: 270 capsule, Rfl: 4 .  glipiZIDE (GLUCOTROL) 5 MG tablet, Take 0.5 tablets (2.5 mg total) by mouth daily before breakfast., Disp: 60 tablet, Rfl: 3 .  glucose blood test strip, For testing blood sugar twice per day., Disp: 100 each, Rfl: 12 .  metoprolol tartrate (LOPRESSOR) 25 MG tablet, Take 1 tablet (25 mg total) by mouth daily., Disp: 90 tablet, Rfl: 4 .  simvastatin (ZOCOR) 20 MG tablet, Take 1 tablet (20 mg total) by mouth daily., Disp: 90 tablet, Rfl: 4 .  tamsulosin (FLOMAX) 0.4 MG CAPS capsule, Take 1 capsule (0.4 mg total) by mouth daily., Disp: 90 capsule, Rfl: 4   No Known Allergies  ROS Review of Systems  Constitutional: Positive for  chills.  HENT: Positive for hearing loss.   Eyes: Positive for pain.  Respiratory: Negative for cough.   Gastrointestinal: Negative for abdominal distention.  Endocrine: Negative for polydipsia, polyphagia and polyuria.  Genitourinary: Negative for flank pain.  Neurological: Negative for numbness.  Psychiatric/Behavioral: Negative for behavioral problems.      Objective:    Physical Exam  Constitutional: He appears well-developed and well-nourished.  HENT:  Head: Normocephalic and atraumatic.  Eyes: Pupils are equal, round, and reactive to light. EOM are normal.  Neck: No JVD present. No thyromegaly present.  Cardiovascular:  No murmur heard. Pulmonary/Chest: He has no wheezes.  Abdominal: There is no abdominal tenderness.  Musculoskeletal:     Cervical back: Neck supple.  Lymphadenopathy:    He has cervical adenopathy.    BP 118/61   Pulse 68  Wt Readings from Last 3 Encounters:  12/21/19 178 lb (80.7 kg)  10/13/19 177 lb 12.8 oz (80.6 kg)  10/06/18 186 lb 1.6 oz (84.4 kg)     Health Maintenance Due  Topic Date Due  . OPHTHALMOLOGY EXAM  01/02/2019    There are no preventive care reminders to display for this patient.  Lab Results  Component Value Date   TSH 1.300 10/13/2019   Lab Results  Component Value Date   WBC 6.7 10/13/2019   HGB 12.5 (L) 10/13/2019   HCT 37.7 10/13/2019   MCV 95 10/13/2019   PLT 222 10/13/2019   Lab Results  Component Value Date   NA 147 (H) 10/13/2019   K 5.1 10/13/2019   CO2 29 10/13/2019   GLUCOSE 90 10/13/2019   BUN 9 (L) 10/13/2019   CREATININE 0.82 10/13/2019   BILITOT 1.0 10/13/2019   ALKPHOS 88 10/13/2019   AST 15 10/13/2019   ALT 14 10/13/2019   PROT 6.4 10/13/2019   ALBUMIN 3.8 10/13/2019   CALCIUM 8.8 10/13/2019   Lab Results  Component Value Date   CHOL 121 10/13/2019   Lab Results  Component Value Date   HDL 57 10/13/2019   Lab Results  Component Value Date   LDLCALC 50 10/13/2019   Lab  Results  Component Value Date   TRIG 64 10/13/2019   Lab Results  Component Value Date   CHOLHDL 2.2 10/06/2018   Lab Results  Component Value Date   HGBA1C 5.6 11/10/2019      Assessment & Plan:   Problem List Items Addressed This Visit      Cardiovascular and Mediastinum   Sick sinus syndrome (HCC)     Endocrine   Type II diabetes mellitus with neurological manifestations (HCC)  Other   Gait abnormality    Other Visit Diagnoses    Cardiac pacemaker in situ    -  Primary   Relevant Orders   PACEMAKER IN CLINIC CHECK     Note: Medical Device Follow-up  Patient pacemaker was interrogated by pacemakers analyzer, battery status is okay.  No programming changes were indicated after the review of the data.  Histogram shows no change since the last interrogation Atrial and ventricular sensing thresholds were found to be acceptable Impedance was checked and it was found to be normal.  Thresholds were found to be okay on evaluation of rhythm problem.  No high rate or low rate arrhythmia were noted.  Estimated battery longevity is 3 years. I have personally reviewed the device data and amended the report as necessary.   No orders of the defined types were placed in this encounter. 1. Cardiac pacemaker in situ Patient pacemaker is working well and he does not have any arrhythmia noted on delegation. - PACEMAKER IN CLINIC CHECK  2. Type II diabetes mellitus with neurological manifestations (HCC) Blood sugar stable according to the family  3. Sick sinus syndrome (HCC) No arrhythmia was noted  4. Gait abnormality Patient uses wheelchair and a stick to ambulate  Follow-up: Return in about 3 months (around 04/15/2020).    Corky Downs, MD

## 2020-02-10 ENCOUNTER — Ambulatory Visit (INDEPENDENT_AMBULATORY_CARE_PROVIDER_SITE_OTHER): Payer: Medicare Other | Admitting: Nurse Practitioner

## 2020-02-10 ENCOUNTER — Other Ambulatory Visit: Payer: Self-pay

## 2020-02-10 ENCOUNTER — Encounter: Payer: Self-pay | Admitting: Nurse Practitioner

## 2020-02-10 VITALS — BP 138/72 | HR 62 | Temp 98.0°F | Wt 171.6 lb

## 2020-02-10 DIAGNOSIS — I495 Sick sinus syndrome: Secondary | ICD-10-CM | POA: Diagnosis not present

## 2020-02-10 DIAGNOSIS — I152 Hypertension secondary to endocrine disorders: Secondary | ICD-10-CM

## 2020-02-10 DIAGNOSIS — E1169 Type 2 diabetes mellitus with other specified complication: Secondary | ICD-10-CM

## 2020-02-10 DIAGNOSIS — E870 Hyperosmolality and hypernatremia: Secondary | ICD-10-CM

## 2020-02-10 DIAGNOSIS — I10 Essential (primary) hypertension: Secondary | ICD-10-CM

## 2020-02-10 DIAGNOSIS — I6521 Occlusion and stenosis of right carotid artery: Secondary | ICD-10-CM

## 2020-02-10 DIAGNOSIS — E1159 Type 2 diabetes mellitus with other circulatory complications: Secondary | ICD-10-CM | POA: Diagnosis not present

## 2020-02-10 DIAGNOSIS — E785 Hyperlipidemia, unspecified: Secondary | ICD-10-CM

## 2020-02-10 DIAGNOSIS — E1149 Type 2 diabetes mellitus with other diabetic neurological complication: Secondary | ICD-10-CM | POA: Diagnosis not present

## 2020-02-10 LAB — BAYER DCA HB A1C WAIVED: HB A1C (BAYER DCA - WAIVED): 5.4 % (ref <7.0)

## 2020-02-10 MED ORDER — GABAPENTIN 300 MG PO CAPS: 600.0000 mg | ORAL_CAPSULE | Freq: Two times a day (BID) | ORAL | 4 refills | Status: AC

## 2020-02-10 NOTE — Assessment & Plan Note (Signed)
Chronic, ongoing.  Initial BP high in office today, but manual repeat below goal and home readings at goal, due to advanced age to avoid falls goal <140/90.  Continue current medication regimen and collaboration with cardiology at [facility].  Recommend he continue to monitor BP at home. Avoid hypotension.  BMP today, return in 3 months.

## 2020-02-10 NOTE — Progress Notes (Signed)
BP 138/72   Pulse 62   Temp 98 F (36.7 C) (Oral)   Wt 171 lb 9.6 oz (77.8 kg)   SpO2 97%   BMI 25.34 kg/m    Subjective:    Patient ID: Jeffery Horne, male    DOB: 11-Dec-1926, 86 y.o.   MRN: [id number]  HPI: Jeffery Horne is a 86 y.o. male  Chief Complaint  Patient presents with  . Diabetes  . Hyperlipidemia  . Hypertension   DIABETES Continues on Glipizide 2.5 MG daily.  Takes Gabapentin for neuropathy.  Last A1C in March 5.6%, with Glipizide decreased to 2.5 MG. Hypoglycemic episodes:no Polydipsia/polyuria: no Visual disturbance: no Chest pain: no Paresthesias: no Glucose Monitoring: no             Accucheck frequency: daily             Fasting glucose: 86 to 147             Post prandial: 87 to 168             Evening:              Before meals: Taking Insulin?: no             Long acting insulin:             Short acting insulin: Blood Pressure Monitoring: monthly Retinal Examination: Not up to Date Foot Exam: Up to Date Pneumovax: Up to Date Influenza: refused Aspirin: yes   HYPERTENSION / HYPERLIPIDEMIA Continues on ASA, Plavix, Metoprolol, and Simvastatin.  He also takes daily Tamsulosin for BPH.  Followed by Dr. Juel Burrow at [facility] and last seen 01/14/20.  Has pacemaker in place, placed 2015 and battery checked at recent cardiology visit.  Last BMP in February showed WNL kidney function with mild elevation in NA+ at 147, has been trying to drink more water.. Satisfied with current treatment? yes Duration of hypertension: chronic BP monitoring frequency: monthly BP range: 130/70's range at home BP medication side effects: no Duration of hyperlipidemia: chronic Cholesterol medication side effects: no Cholesterol supplements: none Medication compliance: good compliance Aspirin: yes Recent stressors: no Recurrent headaches: no Visual changes: no Palpitations: no Dyspnea: no Chest pain: no Lower extremity edema: no Dizzy/lightheaded: no  Relevant  past medical, surgical, family and social history reviewed and updated as indicated. Interim medical history since our last visit reviewed. Allergies and medications reviewed and updated.  Review of Systems  Constitutional: Negative for activity change, diaphoresis, fatigue and fever.  Respiratory: Negative for cough, chest tightness, shortness of breath and wheezing.   Cardiovascular: Negative for chest pain, palpitations and leg swelling.  Gastrointestinal: Negative.   Endocrine: Negative for cold intolerance, heat intolerance, polydipsia, polyphagia and polyuria.  Neurological: Negative.   Psychiatric/Behavioral: Negative.     Per HPI unless specifically indicated above     Objective:    BP 138/72   Pulse 62   Temp 98 F (36.7 C) (Oral)   Wt 171 lb 9.6 oz (77.8 kg)   SpO2 97%   BMI 25.34 kg/m   Wt Readings from Last 3 Encounters:  02/10/20 171 lb 9.6 oz (77.8 kg)  12/21/19 178 lb (80.7 kg)  10/13/19 177 lb 12.8 oz (80.6 kg)    Physical Exam Vitals and nursing note reviewed.  Constitutional:      General: He is awake. He is not in acute distress.    Appearance: He is well-developed. He is not ill-appearing.  HENT:  Head: Normocephalic and atraumatic.     Right Ear: Hearing normal. No drainage.     Left Ear: Hearing normal. No drainage.  Eyes:     General: Lids are normal.        Right eye: No discharge.        Left eye: No discharge.     Conjunctiva/sclera: Conjunctivae normal.     Pupils: Pupils are equal, round, and reactive to light.  Neck:     Vascular: Carotid bruit (right side, known) present.  Cardiovascular:     Rate and Rhythm: Normal rate and regular rhythm.     Heart sounds: Normal heart sounds, S1 normal and S2 normal. No murmur. No gallop.   Pulmonary:     Effort: Pulmonary effort is normal. No accessory muscle usage or respiratory distress.     Breath sounds: Normal breath sounds.  Abdominal:     General: Bowel sounds are normal.      Palpations: Abdomen is soft. There is no hepatomegaly or splenomegaly.  Musculoskeletal:        General: Normal range of motion.     Cervical back: Normal range of motion and neck supple.     Right lower leg: No edema.     Left lower leg: No edema.  Skin:    General: Skin is warm and dry.     Capillary Refill: Capillary refill takes less than 2 seconds.  Neurological:     Mental Status: He is alert and oriented to person, place, and time.  Psychiatric:        Attention and Perception: Attention normal.        Mood and Affect: Mood normal.        Speech: Speech normal.        Behavior: Behavior normal. Behavior is cooperative.        Thought Content: Thought content normal.    Results for orders placed or performed in visit on 11/10/19  Bayer DCA Hb A1c Waived  Result Value Ref Range   HB A1C (BAYER DCA - WAIVED) 5.6 <7.0 %      Assessment & Plan:   Problem List Items Addressed This Visit      Cardiovascular and Mediastinum   Hypertension associated with diabetes ([place])    Chronic, ongoing.  Initial BP high in office today, but manual repeat below goal and home readings at goal, due to advanced age to avoid falls goal <140/90.  Continue current medication regimen and collaboration with cardiology at [facility].  Recommend he continue to monitor BP at home. Avoid hypotension.  BMP today, return in 3 months.      Relevant Orders   Basic metabolic panel   Sick sinus syndrome (HCC)    Chronic, stable.  Continue collaboration with [facility] cardiology for checks.      Carotid artery stenosis, symptomatic, right    Chronic, ongoing.  Followed by cardiology at [facility].  Continue this collaboration and current medication regimen.        Endocrine   Type II diabetes mellitus with neurological manifestations ([place]) - Primary    Chronic, stable with A1C 5.4% today and urine ALB 30 with A:C 30-300 at last visit.  Due to advanced age and risk for falls + stable A1C and BS at home, will trial  discontinuation of Glipizide at this time.  Continue Gabapentin for neuropathy, script sent.  Discussed with patient to continue to monitor BS closely at home and if consistent >130 in morning or >180  after meals alert provider..  Recommend he monitor BS in morning and after a meal three times a week.  Goal A1C for age <8 due to age.  If A1C elevations without Glipizide could consider Jardiance or Farxiga at low dose.  Return in 3 months or sooner if increase BS consistently at home.      Relevant Medications   gabapentin (NEURONTIN) 300 MG capsule   Other Relevant Orders   Bayer DCA Hb A1c Waived   Hyperlipidemia associated with type 2 diabetes mellitus (HCC)    Chronic, ongoing.  Continue current medication regimen and adjust as needed.  Lipid panel next visit, last LDL <70.        Other   Hypernatremia    Recheck BMP today and recommend continue to increase water intake at home + decrease salt intake.          Follow up plan: Return in about 3 months (around 05/12/2020) for T2DM, HTN/HLD.

## 2020-02-10 NOTE — Assessment & Plan Note (Addendum)
Chronic, stable with A1C 5.4% today and urine ALB 30 with A:C 30-300 at last visit.  Due to advanced age and risk for falls + stable A1C and BS at home, will trial discontinuation of Glipizide at this time.  Continue Gabapentin for neuropathy, script sent.  Discussed with patient to continue to monitor BS closely at home and if consistent >130 in morning or >180 after meals alert provider..  Recommend he monitor BS in morning and after a meal three times a week.  Goal A1C for age <8 due to age.  If A1C elevations without Glipizide could consider Jardiance or Farxiga at low dose.  Return in 3 months or sooner if increase BS consistently at home.

## 2020-02-10 NOTE — Assessment & Plan Note (Signed)
Recheck BMP today and recommend continue to increase water intake at home + decrease salt intake.

## 2020-02-10 NOTE — Patient Instructions (Signed)

## 2020-02-10 NOTE — Assessment & Plan Note (Signed)
Chronic, ongoing.  Continue current medication regimen and adjust as needed.  Lipid panel next visit, last LDL <70.

## 2020-02-10 NOTE — Assessment & Plan Note (Signed)
Chronic, stable.  Continue collaboration with [facility] cardiology for checks. 

## 2020-02-10 NOTE — Assessment & Plan Note (Signed)
Chronic, ongoing.  Followed by cardiology at [facility].  Continue this collaboration and current medication regimen.

## 2020-02-11 LAB — BASIC METABOLIC PANEL
BUN/Creatinine Ratio: 16 (ref 10–24)
BUN: 15 mg/dL (ref 10–36)
CO2: 27 mmol/L (ref 20–29)
Calcium: 8.7 mg/dL (ref 8.6–10.2)
Chloride: 106 mmol/L (ref 96–106)
Creatinine, Ser: 0.95 mg/dL (ref 0.76–1.27)
GFR calc Af Amer: 81 mL/min/{1.73_m2} (ref >59)
GFR calc non Af Amer: 70 mL/min/{1.73_m2} (ref >59)
Glucose: 96 mg/dL (ref 65–99)
Potassium: 4.3 mmol/L (ref 3.5–5.2)
Sodium: 145 mmol/L — ABNORMAL HIGH (ref 134–144)

## 2020-02-11 NOTE — Progress Notes (Signed)
Good morning, please let Jeffery Horne know his kidney function continues to be fantastic on labs.  Sodium (salt) level remains a little elevated at 145, but lower than last check.  I recommend he ensure he get plenty of water in daily at home and decrease salt intake.  If any questions let me know.  Have a great day!!

## 2020-04-14 ENCOUNTER — Ambulatory Visit (INDEPENDENT_AMBULATORY_CARE_PROVIDER_SITE_OTHER): Payer: Medicare Other | Admitting: Internal Medicine

## 2020-04-14 ENCOUNTER — Other Ambulatory Visit: Payer: Self-pay

## 2020-04-14 VITALS — BP 131/72 | HR 86

## 2020-04-14 DIAGNOSIS — Z950 Presence of cardiac pacemaker: Secondary | ICD-10-CM | POA: Diagnosis not present

## 2020-04-14 DIAGNOSIS — E1149 Type 2 diabetes mellitus with other diabetic neurological complication: Secondary | ICD-10-CM

## 2020-04-14 DIAGNOSIS — N138 Other obstructive and reflux uropathy: Secondary | ICD-10-CM

## 2020-04-14 DIAGNOSIS — N401 Enlarged prostate with lower urinary tract symptoms: Secondary | ICD-10-CM

## 2020-04-14 DIAGNOSIS — G453 Amaurosis fugax: Secondary | ICD-10-CM | POA: Diagnosis not present

## 2020-04-14 NOTE — Progress Notes (Signed)
Established Patient Office Visit  SUBJECTIVE:  Subjective  Patient ID: Jeffery Horne, male    DOB: 12-20-1926  Age: 86 y.o. MRN: [id number]  CC:  Chief Complaint  Patient presents with  . Pacemaker Check    HPI Jeffery Horne is a 86 y.o. male presenting today for a pacemaker check.  Patient pacemaker was interrogated by pacemakers analyzer, battery status is okay.  No programming changes were indicated after the review of the data.  Histogram shows no change since the last interrogation Atrial and ventricular sensing thresholds were found to be acceptable Impedance was checked and it was found to be normal.  Thresholds were found to be okay on evaluation of rhythm problem.  No high rate or low rate arrhythmia were noted.  Estimated battery longevity is 3 years. I have personally reviewed the device data and amended the report as necessary.   He has lost several pounds recently. He does not drink any nutritional supplementation like Boost or Ensure. He does not eat a lot of food when he does eat. He does note that he feels fatigued and falls asleep easily when sitting down.    Past Medical History:  Diagnosis Date  . BPH (benign prostatic hypertrophy) with urinary obstruction   . Hearing impairment   . Hyperlipidemia   . Hypertension   . Peripheral vascular disease (HCC)   . Primary gout   . Type II diabetes mellitus with neurological manifestations [facility])     Past Surgical History:  Procedure Laterality Date  . HERNIA REPAIR    . PACEMAKER INSERTION     Dr. Juel Burrow    Family History  Problem Relation Age of Onset  . Diabetes Mother   . Hypertension Mother   . Stroke Mother   . Hypertension Father     Social History   Socioeconomic History  . Marital status: Widowed    Spouse name: Not on file  . Number of children: Not on file  . Years of education: Not on file  . Highest education level: GED or equivalent  Occupational History  . Not on file  Tobacco  Use  . Smoking status: Former Smoker    Types: Cigarettes    Quit date: 09/11/1971    Years since quitting: 48.6  . Smokeless tobacco: Never Used  Vaping Use  . Vaping Use: Never used  Substance and Sexual Activity  . Alcohol use: No  . Drug use: No  . Sexual activity: Not on file  Other Topics Concern  . Not on file  Social History Narrative  . Not on file   Social Determinants of Health   Financial Resource Strain: Low Risk   . Difficulty of Paying Living Expenses: Not hard at all  Food Insecurity: No Food Insecurity  . Worried About Programme researcher, broadcasting/film/video in the Last Year: Never true  . Ran Out of Food in the Last Year: Never true  Transportation Needs: No Transportation Needs  . Lack of Transportation (Medical): No  . Lack of Transportation (Non-Medical): No  Physical Activity: Insufficiently Active  . Days of Exercise per Week: 1 day  . Minutes of Exercise per Session: 60 min  Stress:   . Feeling of Stress :   Social Connections: Moderately Isolated  . Frequency of Communication with Friends and Family: Three times a week  . Frequency of Social Gatherings with Friends and Family: Three times a week  . Attends Religious Services: More than 4 times per year  .  Active Member of Clubs or Organizations: No  . Attends Banker Meetings: Never  . Marital Status: Widowed  Intimate Partner Violence:   . Fear of Current or Ex-Partner:   . Emotionally Abused:   Marland Kitchen Physically Abused:   . Sexually Abused:      Current Outpatient Medications:  .  aspirin EC 81 MG tablet, Take 81 mg by mouth daily., Disp: , Rfl:  .  clopidogrel (PLAVIX) 75 MG tablet, Take 75 mg by mouth daily., Disp: , Rfl: 7 .  enalapril (VASOTEC) 5 MG tablet, Take 1 tablet (5 mg total) by mouth daily., Disp: 90 tablet, Rfl: 4 .  fluticasone (FLONASE) 50 MCG/ACT nasal spray, Place 2 sprays into both nostrils daily., Disp: 16 g, Rfl: 12 .  gabapentin (NEURONTIN) 300 MG capsule, Take 2 capsules (600 mg  total) by mouth 2 (two) times daily., Disp: 360 capsule, Rfl: 4 .  glucose blood test strip, For testing blood sugar twice per day., Disp: 100 each, Rfl: 12 .  metoprolol tartrate (LOPRESSOR) 25 MG tablet, Take 1 tablet (25 mg total) by mouth daily., Disp: 90 tablet, Rfl: 4 .  simvastatin (ZOCOR) 20 MG tablet, Take 1 tablet (20 mg total) by mouth daily., Disp: 90 tablet, Rfl: 4 .  tamsulosin (FLOMAX) 0.4 MG CAPS capsule, Take 1 capsule (0.4 mg total) by mouth daily., Disp: 90 capsule, Rfl: 4   No Known Allergies  ROS Review of Systems  Constitutional: Positive for fatigue (mild) and unexpected weight change (down 7 lbs).  HENT: Negative.   Eyes: Negative.   Respiratory: Negative.   Cardiovascular: Negative.   Gastrointestinal: Negative.   Endocrine: Negative.   Genitourinary: Negative.   Musculoskeletal: Negative.   Skin: Negative.   Allergic/Immunologic: Negative.   Neurological: Negative.   Hematological: Negative.   Psychiatric/Behavioral: Negative.   All other systems reviewed and are negative.    OBJECTIVE:    Physical Exam Vitals reviewed.  Constitutional:      Appearance: Normal appearance.  HENT:     Mouth/Throat:     Mouth: Mucous membranes are moist.  Eyes:     Pupils: Pupils are equal, round, and reactive to light.  Neck:     Vascular: No carotid bruit.  Cardiovascular:     Rate and Rhythm: Normal rate and regular rhythm.     Pulses: Normal pulses.     Heart sounds: Normal heart sounds.  Pulmonary:     Effort: Pulmonary effort is normal.     Breath sounds: Normal breath sounds.  Abdominal:     General: Bowel sounds are normal.     Palpations: Abdomen is soft. There is no hepatomegaly, splenomegaly or mass.     Tenderness: There is no abdominal tenderness.     Hernia: No hernia is present.  Musculoskeletal:     Cervical back: Neck supple.     Right lower leg: No edema.     Left lower leg: No edema.  Skin:    Findings: No rash.  Neurological:      Mental Status: He is alert and oriented to person, place, and time.     Motor: No weakness.  Psychiatric:        Mood and Affect: Mood normal.        Behavior: Behavior normal.     BP 131/72   Pulse 86  Wt Readings from Last 3 Encounters:  02/10/20 171 lb 9.6 oz (77.8 kg)  12/21/19 178 lb (80.7 kg)  10/13/19 177 lb  12.8 oz (80.6 kg)    Health Maintenance Due  Topic Date Due  . OPHTHALMOLOGY EXAM  01/02/2019  . INFLUENZA VACCINE  04/10/2020    There are no preventive care reminders to display for this patient.  CBC Latest Ref Rng & Units 10/13/2019 02/26/2019 11/07/2018  WBC 3.4 - 10.8 x10E3/uL 6.7 6.7 7.1  Hemoglobin 13.0 - 17.7 g/dL 12.5(L) 11.4(L) 12.3(L)  Hematocrit 37.5 - 51.0 % 37.7 34.4(L) 37.8  Platelets 150 - 450 x10E3/uL 222 196 219   CMP Latest Ref Rng & Units 02/10/2020 10/13/2019 02/26/2019  Glucose 65 - 99 mg/dL 96 90 211(H)  BUN 10 - 36 mg/dL 15 9(L) 22  Creatinine 0.76 - 1.27 mg/dL 4.17 4.08 1.44  Sodium 134 - 144 mmol/L 145(H) 147(H) 146(H)  Potassium 3.5 - 5.2 mmol/L 4.3 5.1 3.9  Chloride 96 - 106 mmol/L 106 108(H) 106  CO2 20 - 29 mmol/L 27 29 26   Calcium 8.6 - 10.2 mg/dL 8.7 8.8 8.9  Total Protein 6.0 - 8.5 g/dL - 6.4 -  Total Bilirubin 0.0 - 1.2 mg/dL - 1.0 -  Alkaline Phos 39 - 117 IU/L - 88 -  AST 0 - 40 IU/L - 15 17  ALT 0 - 44 IU/L - 14 22    Lab Results  Component Value Date   TSH 1.300 10/13/2019   Lab Results  Component Value Date   ALBUMIN 3.8 10/13/2019   Lab Results  Component Value Date   CHOL 121 10/13/2019   CHOL 116 02/26/2019   CHOL 122 10/06/2018   HDL 57 10/13/2019   HDL 56 10/06/2018   HDL 48 10/01/2017   LDLCALC 50 10/13/2019   LDLCALC 57 10/06/2018   LDLCALC 60 10/01/2017   CHOLHDL 2.2 10/06/2018   CHOLHDL 2.5 10/01/2017   CHOLHDL 2.7 09/25/2016   Lab Results  Component Value Date   TRIG 64 10/13/2019   Lab Results  Component Value Date   HGBA1C 5.4 02/10/2020   HGBA1C 5.6 11/10/2019   HGBA1C 5.6 10/13/2019       ASSESSMENT & PLAN:   Problem List Items Addressed This Visit      Cardiovascular and Mediastinum   Amaurosis fugax of right eye    No recurrence of that.        Endocrine   Type II diabetes mellitus with neurological manifestations (HCC)    Blood sugar 96.        Genitourinary   BPH with obstruction/lower urinary tract symptoms    Chronic problem but stable at the present time        Other   Cardiac pacemaker in situ - Primary    Patient   Pacer is functioning well with good battery life      Relevant Orders   PACEMAKER IN CLINIC CHECK (Completed)      No orders of the defined types were placed in this encounter.     Follow-up: Return in about 3 months (around 07/15/2020).    Dr. 13/01/2020 [facility] [address], [place], [place]   By signing my name below, I, [id number], attest that this documentation has been prepared under the direction and in the presence of [employer], MD. Electronically Signed: Corky Downs, MD 04/23/20, 5:40 PM   I personally performed the services described in this documentation, which was SCRIBED in my presence. The recorded information has been reviewed and considered accurate. It has been edited as necessary during review. 04/25/20, MD

## 2020-04-23 ENCOUNTER — Encounter: Payer: Self-pay | Admitting: Internal Medicine

## 2020-04-23 DIAGNOSIS — Z950 Presence of cardiac pacemaker: Secondary | ICD-10-CM | POA: Insufficient documentation

## 2020-04-23 NOTE — Assessment & Plan Note (Signed)
Patient is functioning well with good battery life

## 2020-04-23 NOTE — Assessment & Plan Note (Signed)
Blood sugar 96

## 2020-04-23 NOTE — Assessment & Plan Note (Signed)
No recurrence of that.

## 2020-04-23 NOTE — Assessment & Plan Note (Signed)
Chronic problem but stable at the present time. 

## 2020-05-04 LAB — HM DIABETES EYE EXAM

## 2020-05-05 ENCOUNTER — Telehealth: Payer: Self-pay | Admitting: Nurse Practitioner

## 2020-05-05 NOTE — Telephone Encounter (Signed)
Pt's daughter is also concerned that pt is losing weight

## 2020-05-05 NOTE — Telephone Encounter (Signed)
Jeffery Horne pt's daughter presented in office concerned in regards to father's health, unforunately she is not on dpr. Jeffery Horne states that she is concerned that pt has dementia and having issues with diarrhea. She states that he states that it's not frequent but she states that she knows that it's more than what he says. She states that he is using the bathroom throughout the house and refuses to wear incontinence garments. She doesn't want her dad to know that she has come up her because he would be upset. She states that he says his a1c is 5.4 Please advise.

## 2020-05-06 NOTE — Telephone Encounter (Signed)
Spoke to [place] on phone, she is aware since not on DPR list can not provide information to her on father.  Encouraged her to talk to patient about placing on DPR list and attending next visit with him.  Weight was 190 range in 2019 and now is staying in 170 range over past few months.  She reports he has some diarrhea since they started diabetic supplement drink -- Equate diabetes drink.  She reports he is not eating as much and he has not been same since 2016 when lost his wife.  Reports he is a private person and she does not want him to know she spoke to provider.

## 2020-05-06 NOTE — Telephone Encounter (Signed)
His daughter wanted me to say thank you for talking to her yesterday.  She thanks you for your patience and kindness Jeffery Horne.

## 2020-05-26 ENCOUNTER — Ambulatory Visit: Payer: Medicare Other | Admitting: Nurse Practitioner

## 2020-05-27 NOTE — Progress Notes (Signed)
BP 111/69   Pulse 72   Temp 97.9 F (36.6 C) (Oral)   Wt 154 lb 12.8 oz (70.2 kg)   SpO2 98%   BMI 22.86 kg/m    Subjective:    Patient ID: Jeffery Horne, male    DOB: February 15, 1927, 86 y.o.   MRN: [id number]  HPI: Jeffery Horne is a 86 y.o. male presenting for follow up.  Chief Complaint  Patient presents with  . Diabetes  . Hyperlipidemia  . Hypertension   DIABETES  Patient reports he took 1 dose of glipizide yesterday because the pharmacy automatically refilled it.  He reports this was discontinued by Jolene at the last appointment.  Reports he probably does not eat as much as he should.  His daughters bring him food twice per week and he does not have trouble preparing food or affording food.  Denies dysphagia. He is "just is not hungry as much."   Hypoglycemic episodes:no Polydipsia/polyuria: no Visual disturbance: no Chest pain: no Paresthesias: yes - reports gabapentin helps with this Glucose Monitoring: yes  Accucheck frequency: Daily  Fasting glucose: 100-140 Taking Insulin?: no Blood Pressure Monitoring: rarely Retinal Examination: Up to Date Foot Exam: Up to Date Diabetic Education: Completed Pneumovax: Up to Date Influenza: Not up to Date Aspirin: yes  HYPERTENSION / HYPERLIPIDEMIA Satisfied with current treatment? yes Duration of hypertension: chronic BP monitoring frequency: rarely BP range: 110s/60s BP medication side effects: no Past BP meds: vasotec 5mg , metoprolol 25 mgg Duration of hyperlipidemia: chronic Cholesterol medication side effects: no Cholesterol supplements: none Past cholesterol medications: simvastatin 20mg  Medication compliance: excellent compliance Aspirin: yes Recent stressors: no Recurrent headaches: no Visual changes: no Palpitations: no Dyspnea: no Chest pain: no Lower extremity edema: no Dizzy/lightheaded: yes at times when changing positions too quickly  No Known Allergies  Outpatient Encounter Medications  as of 05/30/2020  Medication Sig  . aspirin EC 81 MG tablet Take 81 mg by mouth daily.  . clopidogrel (PLAVIX) 75 MG tablet Take 75 mg by mouth daily.  . enalapril (VASOTEC) 5 MG tablet Take 1 tablet (5 mg total) by mouth daily.  . fluticasone (FLONASE) 50 MCG/ACT nasal spray Place 2 sprays into both nostrils daily.  gabapentin (NEURONTIN) 300 MG capsule Take 2 capsules (600 mg total) by mouth 2 (two) times daily.  06/01/2020 glucose blood test strip For testing blood sugar twice per day.  . metoprolol tartrate (LOPRESSOR) 25 MG tablet Take 1 tablet (25 mg total) by mouth daily.  . simvastatin (ZOCOR) 20 MG tablet Take 1 tablet (20 mg total) by mouth daily.  . tamsulosin (FLOMAX) 0.4 MG CAPS capsule Take 1 capsule (0.4 mg total) by mouth daily.   No facility-administered encounter medications on file as of 05/30/2020.   Patient Active Problem List   Diagnosis Date Noted  . Cardiac pacemaker in situ 04/23/2020  . Hypernatremia 10/14/2019  . Carotid artery stenosis, symptomatic, right 03/20/2018  . Amaurosis fugax of right eye 03/20/2018  . Advanced care planning/counseling discussion 10/01/2017  . Skin lesion of face 05/16/2016  . Sick sinus syndrome (HCC) 07/28/2015  . BPH with obstruction/lower urinary tract symptoms 07/28/2015  . Hearing impairment 07/28/2015  . Gait abnormality 05/25/2015  . Arthritis of ankle, left 05/11/2015  . Chronic hoarseness 05/11/2015  . Hypertension associated with diabetes (HCC)   . Type II diabetes mellitus with neurological manifestations (HCC)   . Hyperlipidemia associated with type 2 diabetes mellitus (HCC)    Past Medical History:  Diagnosis  Date  . BPH (benign prostatic hypertrophy) with urinary obstruction   . Hearing impairment   . Hyperlipidemia   . Hypertension   . Peripheral vascular disease (HCC)   . Primary gout   . Type II diabetes mellitus with neurological manifestations (HCC)    Relevant past medical, surgical, family and social history  reviewed and updated as indicated. Interim medical history since our last visit reviewed.  Review of Systems  Constitutional: Negative.  Negative for activity change, appetite change, fatigue and fever.  HENT: Negative.   Eyes: Negative.  Negative for visual disturbance.  Respiratory: Negative.  Negative for chest tightness, shortness of breath and wheezing.   Cardiovascular: Negative.  Negative for chest pain.  Gastrointestinal: Negative.  Negative for nausea and vomiting.  Endocrine: Negative.   Genitourinary: Negative.  Negative for dysuria.  Musculoskeletal: Positive for arthralgias (chronic left ankle pain).  Skin: Negative.  Negative for color change and pallor.  Neurological: Negative.  Negative for dizziness, tremors, weakness, light-headedness and numbness.  Psychiatric/Behavioral: Negative.  Negative for confusion, decreased concentration and sleep disturbance.   Per HPI unless specifically indicated above    Objective:    BP 111/69   Pulse 72   Temp 97.9 F (36.6 C) (Oral)   Wt 154 lb 12.8 oz (70.2 kg)   SpO2 98%   BMI 22.86 kg/m   Wt Readings from Last 3 Encounters:  05/30/20 154 lb 12.8 oz (70.2 kg)  02/10/20 171 lb 9.6 oz (77.8 kg)  12/21/19 178 lb (80.7 kg)    Physical Exam Vitals and nursing note reviewed.  Constitutional:      General: He is awake. He is not in acute distress.    Appearance: He is well-developed. He is not ill-appearing.  HENT:     Head: Normocephalic and atraumatic.     Right Ear: Hearing normal. No drainage.     Left Ear: Hearing normal. No drainage.     Mouth/Throat:     Mouth: Mucous membranes are moist.     Pharynx: No oropharyngeal exudate.  Eyes:     General: Lids are normal. No scleral icterus.    Extraocular Movements: Extraocular movements intact.     Pupils: Pupils are equal, round, and reactive to light.  Cardiovascular:     Rate and Rhythm: Normal rate and regular rhythm.     Heart sounds: Normal heart sounds, S1 normal  and S2 normal. No murmur heard.  No gallop.   Pulmonary:     Effort: Pulmonary effort is normal. No accessory muscle usage or respiratory distress.     Breath sounds: Normal breath sounds.  Abdominal:     General: Bowel sounds are normal.     Palpations: Abdomen is soft. There is no hepatomegaly or splenomegaly.  Musculoskeletal:        General: Normal range of motion.     Cervical back: Normal range of motion and neck supple.     Right lower leg: No edema.     Left lower leg: No edema.  Skin:    General: Skin is warm and dry.     Capillary Refill: Capillary refill takes less than 2 seconds.  Neurological:     Mental Status: He is alert and oriented to person, place, and time.  Psychiatric:        Attention and Perception: Attention normal.        Mood and Affect: Mood normal.        Speech: Speech normal.  Behavior: Behavior normal. Behavior is cooperative.        Thought Content: Thought content normal.     Results for orders placed or performed in visit on 05/11/20  HM DIABETES EYE EXAM  Result Value Ref Range   HM Diabetic Eye Exam No Retinopathy No Retinopathy      Assessment & Plan:   Problem List Items Addressed This Visit      Cardiovascular and Mediastinum   Hypertension associated with diabetes (HCC) - Primary    Chronic, stable.  BP stable in office today.  Continue vasotec 5 mg and metoprolol 25 mg daily.  Encouraged ongoing monitoring of BP at home.  BMP and CBC today.      Relevant Orders   Basic Metabolic Panel (BMET)   CBC   Sick sinus syndrome (HCC)    Chronic, stable.  S/p pacemaker in 2015 - last interrogated August 2021 with good function and battery life.  Continue collaboration with [facility] Cardiology and vascular.        Endocrine   Type II diabetes mellitus with neurological manifestations (HCC)    Chronic, stable without medication.  A1c 5.8% today.  Continue to monitor blood sugar daily and continue to work on increasing appetite -  discussed high protein foods and increasing intake of glucerna shakes.  Continue to hold glipizide for now - patient educated to not take this medication.  Declines flu shot today - education given.  Up to date on foot and eye exam.      Relevant Orders   Bayer DCA Hb A1c Waived   CBC   Hyperlipidemia associated with type 2 diabetes mellitus (HCC)    Chronic, ongoing.  Continue simvastatin 20 mg daily.  Lipids checked today.      Relevant Orders   Bayer DCA Hb A1c Waived   Lipid Panel w/o Chol/HDL Ratio       Follow up plan: Return in about 3 months (around 08/29/2020) for DM, HTN/HLD.

## 2020-05-30 ENCOUNTER — Ambulatory Visit (INDEPENDENT_AMBULATORY_CARE_PROVIDER_SITE_OTHER): Payer: Medicare Other | Admitting: Nurse Practitioner

## 2020-05-30 ENCOUNTER — Other Ambulatory Visit: Payer: Self-pay

## 2020-05-30 ENCOUNTER — Encounter: Payer: Self-pay | Admitting: Nurse Practitioner

## 2020-05-30 VITALS — BP 111/69 | HR 72 | Temp 97.9°F | Ht 69.0 in | Wt 154.8 lb

## 2020-05-30 DIAGNOSIS — E1159 Type 2 diabetes mellitus with other circulatory complications: Secondary | ICD-10-CM

## 2020-05-30 DIAGNOSIS — E1149 Type 2 diabetes mellitus with other diabetic neurological complication: Secondary | ICD-10-CM | POA: Diagnosis not present

## 2020-05-30 DIAGNOSIS — I495 Sick sinus syndrome: Secondary | ICD-10-CM

## 2020-05-30 DIAGNOSIS — E785 Hyperlipidemia, unspecified: Secondary | ICD-10-CM

## 2020-05-30 DIAGNOSIS — I10 Essential (primary) hypertension: Secondary | ICD-10-CM

## 2020-05-30 DIAGNOSIS — E1169 Type 2 diabetes mellitus with other specified complication: Secondary | ICD-10-CM

## 2020-05-30 DIAGNOSIS — I152 Hypertension secondary to endocrine disorders: Secondary | ICD-10-CM

## 2020-05-30 LAB — BAYER DCA HB A1C WAIVED: HB A1C (BAYER DCA - WAIVED): 5.8 % (ref <7.0)

## 2020-05-30 NOTE — Assessment & Plan Note (Signed)
Chronic, ongoing.  Continue simvastatin 20 mg daily.  Lipids checked today.

## 2020-05-30 NOTE — Assessment & Plan Note (Addendum)
Chronic, stable.  BP stable in office today.  Continue vasotec 5 mg and metoprolol 25 mg daily.  Encouraged ongoing monitoring of BP at home.  BMP and CBC today.

## 2020-05-30 NOTE — Assessment & Plan Note (Addendum)
Chronic, stable without medication.  A1c 5.8% today.  Continue to monitor blood sugar daily and continue to work on increasing appetite - discussed high protein foods and increasing intake of glucerna shakes.  Continue to hold glipizide for now - patient educated to not take this medication.  Declines flu shot today - education given.  Up to date on foot and eye exam.

## 2020-05-30 NOTE — Patient Instructions (Signed)

## 2020-05-30 NOTE — Assessment & Plan Note (Signed)
Chronic, stable.  S/p pacemaker in 2015 - last interrogated August 2021 with good function and battery life.  Continue collaboration with [facility] Cardiology and vascular.

## 2020-05-31 ENCOUNTER — Encounter: Payer: Self-pay | Admitting: Nurse Practitioner

## 2020-05-31 LAB — CBC
Hematocrit: 34.2 % — ABNORMAL LOW (ref 37.5–51.0)
Hemoglobin: 11.3 g/dL — ABNORMAL LOW (ref 13.0–17.7)
MCH: 30.7 pg (ref 26.6–33.0)
MCHC: 33 g/dL (ref 31.5–35.7)
MCV: 93 fL (ref 79–97)
Platelets: 224 x10E3/uL (ref 150–450)
RBC: 3.68 x10E6/uL — ABNORMAL LOW (ref 4.14–5.80)
RDW: 13.1 % (ref 11.6–15.4)
WBC: 5.3 x10E3/uL (ref 3.4–10.8)

## 2020-05-31 LAB — LIPID PANEL W/O CHOL/HDL RATIO
Cholesterol, Total: 123 mg/dL (ref 100–199)
HDL: 52 mg/dL
LDL Chol Calc (NIH): 59 mg/dL (ref 0–99)
Triglycerides: 52 mg/dL (ref 0–149)
VLDL Cholesterol Cal: 12 mg/dL (ref 5–40)

## 2020-05-31 LAB — BASIC METABOLIC PANEL WITH GFR
BUN/Creatinine Ratio: 15 (ref 10–24)
BUN: 14 mg/dL (ref 10–36)
CO2: 27 mmol/L (ref 20–29)
Calcium: 8.7 mg/dL (ref 8.6–10.2)
Chloride: 106 mmol/L (ref 96–106)
Creatinine, Ser: 0.94 mg/dL (ref 0.76–1.27)
GFR calc Af Amer: 82 mL/min/1.73
GFR calc non Af Amer: 71 mL/min/1.73
Glucose: 63 mg/dL — ABNORMAL LOW (ref 65–99)
Potassium: 4.2 mmol/L (ref 3.5–5.2)
Sodium: 144 mmol/L (ref 134–144)

## 2020-07-14 ENCOUNTER — Ambulatory Visit (INDEPENDENT_AMBULATORY_CARE_PROVIDER_SITE_OTHER): Payer: Medicare Other | Admitting: Internal Medicine

## 2020-07-14 ENCOUNTER — Other Ambulatory Visit: Payer: Self-pay

## 2020-07-14 VITALS — BP 128/70 | HR 92

## 2020-07-14 DIAGNOSIS — N401 Enlarged prostate with lower urinary tract symptoms: Secondary | ICD-10-CM | POA: Diagnosis not present

## 2020-07-14 DIAGNOSIS — Z950 Presence of cardiac pacemaker: Secondary | ICD-10-CM

## 2020-07-14 DIAGNOSIS — N138 Other obstructive and reflux uropathy: Secondary | ICD-10-CM

## 2020-07-14 DIAGNOSIS — R269 Unspecified abnormalities of gait and mobility: Secondary | ICD-10-CM

## 2020-07-17 ENCOUNTER — Encounter: Payer: Self-pay | Admitting: Internal Medicine

## 2020-07-17 NOTE — Assessment & Plan Note (Signed)
Pacemaker is functioning well.  Please see the report

## 2020-07-17 NOTE — Assessment & Plan Note (Signed)
Patient gait is unstable because of senility.

## 2020-07-17 NOTE — Progress Notes (Signed)
Established Patient Office Visit  Subjective:  Patient ID: Jeffery Horne, male    DOB: 11-Dec-1926  Age: 86 y.o. MRN: [id number]  CC:  Chief Complaint  Patient presents with  . Pacemaker Check    HPI  Jeffery Horne presents for pacemaker check and office visit.  Past Medical History:  Diagnosis Date  . BPH (benign prostatic hypertrophy) with urinary obstruction   . Hearing impairment   . Hyperlipidemia   . Hypertension   . Peripheral vascular disease (HCC)   . Primary gout   . Type II diabetes mellitus with neurological manifestations [facility])     Past Surgical History:  Procedure Laterality Date  . HERNIA REPAIR    . PACEMAKER INSERTION     Dr. Juel Burrow    Family History  Problem Relation Age of Onset  . Diabetes Mother   . Hypertension Mother   . Stroke Mother   . Hypertension Father     Social History   Socioeconomic History  . Marital status: Widowed    Spouse name: Not on file  . Number of children: Not on file  . Years of education: Not on file  . Highest education level: GED or equivalent  Occupational History  . Not on file  Tobacco Use  . Smoking status: Former Smoker    Types: Cigarettes    Quit date: 09/11/1971    Years since quitting: 48.8  . Smokeless tobacco: Never Used  Vaping Use  . Vaping Use: Never used  Substance and Sexual Activity  . Alcohol use: No  . Drug use: No  . Sexual activity: Not on file  Other Topics Concern  . Not on file  Social History Narrative  . Not on file   Social Determinants of Health   Financial Resource Strain: Low Risk   . Difficulty of Paying Living Expenses: Not hard at all  Food Insecurity: No Food Insecurity  . Worried About Programme researcher, broadcasting/film/video in the Last Year: Never true  . Ran Out of Food in the Last Year: Never true  Transportation Needs: No Transportation Needs  . Lack of Transportation (Medical): No  . Lack of Transportation (Non-Medical): No  Physical Activity: Insufficiently Active   . Days of Exercise per Week: 1 day  . Minutes of Exercise per Session: 60 min  Stress:   . Feeling of Stress : Not on file  Social Connections: Moderately Isolated  . Frequency of Communication with Friends and Family: Three times a week  . Frequency of Social Gatherings with Friends and Family: Three times a week  . Attends Religious Services: More than 4 times per year  . Active Member of Clubs or Organizations: No  . Attends Banker Meetings: Never  . Marital Status: Widowed  Intimate Partner Violence:   . Fear of Current or Ex-Partner: Not on file  . Emotionally Abused: Not on file  . Physically Abused: Not on file  . Sexually Abused: Not on file     Current Outpatient Medications:  .  aspirin EC 81 MG tablet, Take 81 mg by mouth daily., Disp: , Rfl:  .  clopidogrel (PLAVIX) 75 MG tablet, Take 75 mg by mouth daily., Disp: , Rfl: 7 .  enalapril (VASOTEC) 5 MG tablet, Take 1 tablet (5 mg total) by mouth daily., Disp: 90 tablet, Rfl: 4 .  fluticasone (FLONASE) 50 MCG/ACT nasal spray, Place 2 sprays into both nostrils daily., Disp: 16 g, Rfl: 12 .  gabapentin (NEURONTIN)  300 MG capsule, Take 2 capsules (600 mg total) by mouth 2 (two) times daily., Disp: 360 capsule, Rfl: 4 .  glucose blood test strip, For testing blood sugar twice per day., Disp: 100 each, Rfl: 12 .  metoprolol tartrate (LOPRESSOR) 25 MG tablet, Take 1 tablet (25 mg total) by mouth daily., Disp: 90 tablet, Rfl: 4 .  simvastatin (ZOCOR) 20 MG tablet, Take 1 tablet (20 mg total) by mouth daily., Disp: 90 tablet, Rfl: 4 .  tamsulosin (FLOMAX) 0.4 MG CAPS capsule, Take 1 capsule (0.4 mg total) by mouth daily., Disp: 90 capsule, Rfl: 4   No Known Allergies  ROS Review of Systems  HENT: Negative.   Respiratory: Negative.   Cardiovascular: Negative.       Objective:    Physical Exam Constitutional:      Appearance: He is normal weight.  HENT:     Mouth/Throat:     Mouth: Mucous membranes are  moist.  Cardiovascular:     Heart sounds: Normal heart sounds. No murmur heard.  No gallop.   Pulmonary:     Breath sounds: Normal breath sounds.  Abdominal:     Palpations: Abdomen is soft.  Neurological:     Mental Status: He is alert.     BP 128/70   Pulse 92  Wt Readings from Last 3 Encounters:  05/30/20 154 lb 12.8 oz (70.2 kg)  02/10/20 171 lb 9.6 oz (77.8 kg)  12/21/19 178 lb (80.7 kg)     There are no preventive care reminders to display for this patient.  There are no preventive care reminders to display for this patient.  Lab Results  Component Value Date   TSH 1.300 10/13/2019   Lab Results  Component Value Date   WBC 5.3 05/30/2020   HGB 11.3 (L) 05/30/2020   HCT 34.2 (L) 05/30/2020   MCV 93 05/30/2020   PLT 224 05/30/2020   Lab Results  Component Value Date   NA 144 05/30/2020   K 4.2 05/30/2020   CO2 27 05/30/2020   GLUCOSE 63 (L) 05/30/2020   BUN 14 05/30/2020   CREATININE 0.94 05/30/2020   BILITOT 1.0 10/13/2019   ALKPHOS 88 10/13/2019   AST 15 10/13/2019   ALT 14 10/13/2019   PROT 6.4 10/13/2019   ALBUMIN 3.8 10/13/2019   CALCIUM 8.7 05/30/2020   Lab Results  Component Value Date   CHOL 123 05/30/2020   Lab Results  Component Value Date   HDL 52 05/30/2020   Lab Results  Component Value Date   LDLCALC 59 05/30/2020   Lab Results  Component Value Date   TRIG 52 05/30/2020   Lab Results  Component Value Date   CHOLHDL 2.2 10/06/2018   Lab Results  Component Value Date   HGBA1C 5.8 05/30/2020      Assessment & Plan:   Problem List Items Addressed This Visit      Genitourinary   BPH with obstruction/lower urinary tract symptoms    Patient is being followed up by urologist.        Other   Gait abnormality    Patient gait is unstable because of senility.      Cardiac pacemaker - Primary    Pacemaker is functioning well.  Please see the report      Relevant Orders   PACEMAKER IN CLINIC CHECK     Patient  pacemaker was interrogated by pacemakers analyzer, battery status is okay.  No programming changes were indicated after the review  of the data.  Histogram shows no change since the last interrogation Atrial and ventricular sensing thresholds were found to be acceptable Impedance was checked and it was found to be normal.  Thresholds were found to be okay on evaluation of rhythm problem.  No high rate or low rate arrhythmia were noted.  Estimated battery longevity is 2.5 yrs. I have personally reviewed the device data and amended the report as necessary.  No orders of the defined types were placed in this encounter.   Follow-up: No follow-ups on file.    Corky Downs, MD

## 2020-07-17 NOTE — Assessment & Plan Note (Signed)
Patient is being followed up by urologist.

## 2020-08-26 ENCOUNTER — Encounter: Payer: Self-pay | Admitting: Nurse Practitioner

## 2020-08-26 DIAGNOSIS — I700 Atherosclerosis of aorta: Secondary | ICD-10-CM | POA: Insufficient documentation

## 2020-08-26 DIAGNOSIS — J439 Emphysema, unspecified: Secondary | ICD-10-CM | POA: Insufficient documentation

## 2020-08-31 ENCOUNTER — Other Ambulatory Visit: Payer: Self-pay

## 2020-08-31 ENCOUNTER — Ambulatory Visit (INDEPENDENT_AMBULATORY_CARE_PROVIDER_SITE_OTHER): Payer: Medicare Other | Admitting: Nurse Practitioner

## 2020-08-31 ENCOUNTER — Encounter: Payer: Self-pay | Admitting: Nurse Practitioner

## 2020-08-31 VITALS — BP 116/64 | HR 65 | Temp 97.9°F | Wt 153.2 lb

## 2020-08-31 DIAGNOSIS — I152 Hypertension secondary to endocrine disorders: Secondary | ICD-10-CM

## 2020-08-31 DIAGNOSIS — E1159 Type 2 diabetes mellitus with other circulatory complications: Secondary | ICD-10-CM | POA: Diagnosis not present

## 2020-08-31 DIAGNOSIS — E1169 Type 2 diabetes mellitus with other specified complication: Secondary | ICD-10-CM

## 2020-08-31 DIAGNOSIS — E538 Deficiency of other specified B group vitamins: Secondary | ICD-10-CM

## 2020-08-31 DIAGNOSIS — E785 Hyperlipidemia, unspecified: Secondary | ICD-10-CM

## 2020-08-31 DIAGNOSIS — E46 Unspecified protein-calorie malnutrition: Secondary | ICD-10-CM | POA: Insufficient documentation

## 2020-08-31 DIAGNOSIS — E440 Moderate protein-calorie malnutrition: Secondary | ICD-10-CM

## 2020-08-31 DIAGNOSIS — R269 Unspecified abnormalities of gait and mobility: Secondary | ICD-10-CM

## 2020-08-31 DIAGNOSIS — E1149 Type 2 diabetes mellitus with other diabetic neurological complication: Secondary | ICD-10-CM

## 2020-08-31 DIAGNOSIS — J432 Centrilobular emphysema: Secondary | ICD-10-CM | POA: Diagnosis not present

## 2020-08-31 DIAGNOSIS — R55 Syncope and collapse: Secondary | ICD-10-CM

## 2020-08-31 DIAGNOSIS — I700 Atherosclerosis of aorta: Secondary | ICD-10-CM

## 2020-08-31 DIAGNOSIS — I495 Sick sinus syndrome: Secondary | ICD-10-CM

## 2020-08-31 LAB — BAYER DCA HB A1C WAIVED: HB A1C (BAYER DCA - WAIVED): 5.4 % (ref <7.0)

## 2020-08-31 MED ORDER — MIRTAZAPINE 7.5 MG PO TABS: 7.5000 mg | ORAL_TABLET | Freq: Every day | ORAL | 4 refills | Status: AC

## 2020-08-31 NOTE — Assessment & Plan Note (Signed)
Chronic, ongoing.  Continue current medication regimen and adjust as needed.  Lipid panel today, last LDL <70.

## 2020-08-31 NOTE — Assessment & Plan Note (Signed)
Noted on CT 04/16/2018.  Current non smoker.  No current inhalers.  Continue to monitor and initiate inhaler regimen as needed.

## 2020-08-31 NOTE — Assessment & Plan Note (Signed)
Chronic, stable with A1C 5.4% today and urine ALB 30 with A:C 30-300 at last visit.  Due to advanced age and risk for falls + stable A1C and BS at home, highly recommend again that he STOP Glipizide -- do not take, daughter agrees with this plan.  Continue Gabapentin for neuropathy, script sent.  Discussed with patient to continue to monitor BS closely at home and if consistent >130 in morning or >180 after meals alert provider..  Recommend he monitor BS in morning and after a meal three times a week.  Goal A1C for age <8 due to age.  If A1C elevations without Glipizide could consider Jardiance or Farxiga at low dose.  Return in 3 months or sooner if increase BS consistently at home.

## 2020-08-31 NOTE — Assessment & Plan Note (Signed)
With 20 pounds weight loss this year, endorses he is not a big eater.  Will initiate Mirtazapine, script sent for 7.5 MG QHS.  Educated daughter and him on this medication.  Recommend he try drinking Glucerna TID and not once a day.  Return in 8 weeks for follow-up.  Labs today.

## 2020-08-31 NOTE — Assessment & Plan Note (Signed)
Chronic, stable.  Continue collaboration with [facility] cardiology for checks.
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
## 2020-08-31 NOTE — Assessment & Plan Note (Signed)
Noted on imaging 03/28/2018.  Continue daily statin and ASA for prevention and monitor closely.

## 2020-08-31 NOTE — Assessment & Plan Note (Signed)
Uses walker, would benefit from PT/OT for evaluation and therapy.  His daughter and him will discuss, will order if they decide to move ahead with this plan.

## 2020-08-31 NOTE — Assessment & Plan Note (Signed)
Chronic, stable.  BP stable in office today.  Continue vasotec 5 mg and metoprolol 25 mg daily.  Encouraged ongoing monitoring of BP at home.  BMP and CBC today. Recommend he schedule follow-up with cardiology ASAP, especially since recent episode of syncope.  His daughter plans on calling to schedule.

## 2020-08-31 NOTE — Assessment & Plan Note (Signed)
Recheck labs today.  Highly recommend he follow-up with cardiology and vascular ASAP for further evaluation.

## 2020-08-31 NOTE — Patient Instructions (Signed)

## 2020-09-01 ENCOUNTER — Other Ambulatory Visit: Payer: Self-pay | Admitting: Nurse Practitioner

## 2020-09-01 ENCOUNTER — Telehealth: Payer: Self-pay

## 2020-09-01 DIAGNOSIS — D649 Anemia, unspecified: Secondary | ICD-10-CM

## 2020-09-01 DIAGNOSIS — D72820 Lymphocytosis (symptomatic): Secondary | ICD-10-CM

## 2020-09-01 LAB — COMPREHENSIVE METABOLIC PANEL
ALT: 15 IU/L (ref 0–44)
AST: 12 IU/L (ref 0–40)
Albumin/Globulin Ratio: 1.5 (ref 1.2–2.2)
Albumin: 3.7 g/dL (ref 3.5–4.6)
Alkaline Phosphatase: 102 IU/L (ref 44–121)
BUN/Creatinine Ratio: 14 (ref 10–24)
BUN: 16 mg/dL (ref 10–36)
Bilirubin Total: 1.1 mg/dL (ref 0.0–1.2)
CO2: 25 mmol/L (ref 20–29)
Calcium: 8.8 mg/dL (ref 8.6–10.2)
Chloride: 106 mmol/L (ref 96–106)
Creatinine, Ser: 1.13 mg/dL (ref 0.76–1.27)
GFR calc Af Amer: 65 mL/min/{1.73_m2} (ref >59)
GFR calc non Af Amer: 57 mL/min/{1.73_m2} — ABNORMAL LOW (ref >59)
Globulin, Total: 2.5 g/dL (ref 1.5–4.5)
Glucose: 141 mg/dL — ABNORMAL HIGH (ref 65–99)
Potassium: 4 mmol/L (ref 3.5–5.2)
Sodium: 143 mmol/L (ref 134–144)
Total Protein: 6.2 g/dL (ref 6.0–8.5)

## 2020-09-01 LAB — CBC WITH DIFFERENTIAL/PLATELET
Basophils Absolute: 0 10*3/uL (ref 0.0–0.2)
Basos: 0 %
EOS (ABSOLUTE): 0.1 10*3/uL (ref 0.0–0.4)
Eos: 0 %
Hematocrit: 35.6 % — ABNORMAL LOW (ref 37.5–51.0)
Hemoglobin: 11.8 g/dL — ABNORMAL LOW (ref 13.0–17.7)
Immature Grans (Abs): 0 10*3/uL (ref 0.0–0.1)
Immature Granulocytes: 0 %
Lymphocytes Absolute: 1.1 10*3/uL (ref 0.7–3.1)
Lymphs: 9 %
MCH: 31.3 pg (ref 26.6–33.0)
MCHC: 33.1 g/dL (ref 31.5–35.7)
MCV: 94 fL (ref 79–97)
Monocytes Absolute: 0.7 10*3/uL (ref 0.1–0.9)
Monocytes: 6 %
Neutrophils Absolute: 10 10*3/uL — ABNORMAL HIGH (ref 1.4–7.0)
Neutrophils: 85 %
Platelets: 211 10*3/uL (ref 150–450)
RBC: 3.77 x10E6/uL — ABNORMAL LOW (ref 4.14–5.80)
RDW: 12.8 % (ref 11.6–15.4)
WBC: 11.9 10*3/uL — ABNORMAL HIGH (ref 3.4–10.8)

## 2020-09-01 LAB — LIPID PANEL W/O CHOL/HDL RATIO
Cholesterol, Total: 115 mg/dL (ref 100–199)
HDL: 57 mg/dL (ref >39)
LDL Chol Calc (NIH): 45 mg/dL (ref 0–99)
Triglycerides: 59 mg/dL (ref 0–149)
VLDL Cholesterol Cal: 13 mg/dL (ref 5–40)

## 2020-09-01 LAB — VITAMIN B12: Vitamin B-12: 220 pg/mL — ABNORMAL LOW (ref 232–1245)

## 2020-09-01 LAB — TSH: TSH: 0.752 u[IU]/mL (ref 0.450–4.500)

## 2020-09-01 NOTE — Progress Notes (Signed)
Please let Mr. Putz know his labs have returned.  Alert his daughter too.  His white blood cell count was mildly elevated and neutrophils. I would like to recheck this via outpatient labs next week and check chest x-ray at [facility] and check urine, I will order these for next week -- they can go to [place] anytime to get x-ray.  B12 level is low at 220, would like to see above 300 -- this is good for brain and nervous system health.  I would like you to start taking Vitamin B12 1000 MCG daily which you can obtain in the vitamin section at many locations.  Any questions?  Have a [place] Christmas Keep being awesome!!  Thank you for allowing me to participate in your care. Kindest regards, Navya Timmons

## 2020-09-01 NOTE — Progress Notes (Signed)
Patient notified and lab appt scheduled.  

## 2020-09-01 NOTE — Telephone Encounter (Signed)
Jeffery Horne given lab results and instructions, verbalizes understanding.

## 2020-09-06 ENCOUNTER — Ambulatory Visit
Admission: RE | Admit: 2020-09-06 | Discharge: 2020-09-06 | Disposition: A | Discharge status: Home / Self Care | Payer: Medicare Other | Source: Ambulatory Visit | Attending: Nurse Practitioner | Admitting: Nurse Practitioner

## 2020-09-06 ENCOUNTER — Other Ambulatory Visit: Payer: Self-pay

## 2020-09-06 ENCOUNTER — Ambulatory Visit
Admission: RE | Admit: 2020-09-06 | Discharge: 2020-09-06 | Disposition: A | Discharge status: Home / Self Care | Payer: Medicare Other | Source: Home / Self Care | Attending: Nurse Practitioner | Admitting: Nurse Practitioner

## 2020-09-06 DIAGNOSIS — D72820 Lymphocytosis (symptomatic): Secondary | ICD-10-CM | POA: Diagnosis present

## 2020-09-06 NOTE — Progress Notes (Signed)
Please let [place] know that chest x-ray shows no pneumonia, does show some underlying lung disease -- if he at one time smoked this would be from those years of smoking.  We will continue to monitor.  Have a great day!! Keep being awesome!!  Thank you for allowing me to participate in your care. Kindest regards, Isay Perleberg

## 2020-09-08 ENCOUNTER — Other Ambulatory Visit: Payer: Medicare Other

## 2020-09-08 ENCOUNTER — Other Ambulatory Visit: Payer: Self-pay

## 2020-09-08 DIAGNOSIS — D649 Anemia, unspecified: Secondary | ICD-10-CM

## 2020-09-08 DIAGNOSIS — D72820 Lymphocytosis (symptomatic): Secondary | ICD-10-CM

## 2020-09-08 LAB — URINALYSIS, ROUTINE W REFLEX MICROSCOPIC
Bilirubin, UA: NEGATIVE
Glucose, UA: NEGATIVE
Ketones, UA: NEGATIVE
Leukocytes,UA: NEGATIVE
Nitrite, UA: NEGATIVE
Protein,UA: NEGATIVE
RBC, UA: NEGATIVE
Specific Gravity, UA: 1.02 (ref 1.005–1.030)
Urobilinogen, Ur: 2 mg/dL — ABNORMAL HIGH (ref 0.2–1.0)
pH, UA: 7 (ref 5.0–7.5)

## 2020-09-09 LAB — CBC WITH DIFFERENTIAL/PLATELET
Basophils Absolute: 0.1 10*3/uL (ref 0.0–0.2)
Basos: 1 %
EOS (ABSOLUTE): 0.5 10*3/uL — ABNORMAL HIGH (ref 0.0–0.4)
Eos: 6 %
Hematocrit: 35.7 % — ABNORMAL LOW (ref 37.5–51.0)
Hemoglobin: 11.2 g/dL — ABNORMAL LOW (ref 13.0–17.7)
Immature Grans (Abs): 0 10*3/uL (ref 0.0–0.1)
Immature Granulocytes: 0 %
Lymphocytes Absolute: 1.3 10*3/uL (ref 0.7–3.1)
Lymphs: 18 %
MCH: 30.3 pg (ref 26.6–33.0)
MCHC: 31.4 g/dL — ABNORMAL LOW (ref 31.5–35.7)
MCV: 97 fL (ref 79–97)
Monocytes Absolute: 0.4 10*3/uL (ref 0.1–0.9)
Monocytes: 6 %
Neutrophils Absolute: 5 10*3/uL (ref 1.4–7.0)
Neutrophils: 69 %
Platelets: 278 10*3/uL (ref 150–450)
RBC: 3.7 x10E6/uL — ABNORMAL LOW (ref 4.14–5.80)
RDW: 12.5 % (ref 11.6–15.4)
WBC: 7.3 10*3/uL (ref 3.4–10.8)

## 2020-09-09 LAB — IRON,TIBC AND FERRITIN PANEL
Ferritin: 194 ng/mL (ref 30–400)
Iron Saturation: 37 % (ref 15–55)
Iron: 80 ug/dL (ref 38–169)
Total Iron Binding Capacity: 215 ug/dL — ABNORMAL LOW (ref 250–450)
UIBC: 135 ug/dL (ref 111–343)

## 2020-09-10 NOTE — Progress Notes (Signed)
Good morning, please let Jeffery Horne's daughter Jeffery Horne (on call list) know that her dad's labs have returned.  Urine no blood noted or infection.  White blood cell count has now trended back down, but he continues to show some mild anemia on labs, which on review of past labs has been present for long while.  Suspect this is related to the low B12, as iron levels are normal.  I would like him to start the Vitamin B12 1000 MCG daily as we discussed. This is over the counter.  We will recheck all levels next visit.  Hope you all have a wonderful day!!  If any questions let me know. Keep being awesome!!  Thank you for allowing me to participate in your care. Kindest regards, Jolene

## 2020-09-23 ENCOUNTER — Telehealth: Payer: Self-pay

## 2020-09-23 NOTE — Telephone Encounter (Signed)
Called [name] pt's daughter advised of Jolene's message, she verbalized understanding

## 2020-09-23 NOTE — Telephone Encounter (Signed)
Copied from CRM [id number]. Topic: Referral - Request for Referral >> Sep 23, 2020 10:29 AM Leafy Ro wrote: Has patient seen PCP for this complaint? Yes on 08-31-2020. Pt daughter carolyn is calling and would like to proceed with the referral to [facility] Reason for referral: gait abnormality. Pt has [employer]

## 2020-09-23 NOTE — Telephone Encounter (Signed)
Order has been placed, if they do not hear from [facility] over next week please let us know.

## 2020-09-23 NOTE — Addendum Note (Signed)
Addended by: Aura Dials T on: 09/23/2020 02:53 PM   Modules accepted: Orders

## 2020-09-29 NOTE — Telephone Encounter (Signed)
Jeffery Horne from [facility] called and stated she doesn't have an order / please fax to fax# [phone] and date the order for a date before today(the day it was suppose to be sent) / please advise asap

## 2020-09-30 NOTE — Telephone Encounter (Signed)
This is resent. Thanks

## 2020-09-30 NOTE — Telephone Encounter (Signed)
Brayton Caves can we resend this referral to [facility] please?

## 2020-10-21 ENCOUNTER — Telehealth: Payer: Self-pay

## 2020-10-21 NOTE — Telephone Encounter (Signed)
Mindi Junker pt's daughter brung in a letter from her sister carolyn gave letter to [employer]

## 2020-10-21 NOTE — Telephone Encounter (Signed)
Read letter and spoke to daughter on phone to offer appreciation for up date and kind letter.

## 2020-10-26 ENCOUNTER — Ambulatory Visit: Payer: Medicare Other | Admitting: Nurse Practitioner

## 2020-11-20 ENCOUNTER — Other Ambulatory Visit: Payer: Self-pay | Admitting: Nurse Practitioner

## 2020-11-20 DIAGNOSIS — E7800 Pure hypercholesterolemia, unspecified: Secondary | ICD-10-CM

## 2020-11-20 DIAGNOSIS — N138 Other obstructive and reflux uropathy: Secondary | ICD-10-CM

## 2020-11-20 DIAGNOSIS — I10 Essential (primary) hypertension: Secondary | ICD-10-CM

## 2020-11-20 NOTE — Telephone Encounter (Signed)
Requested Prescriptions  Pending Prescriptions Disp Refills  . glipiZIDE (GLUCOTROL) 5 MG tablet [Pharmacy Med Name: GLIPIZIDE 5 MG TABLET] 45 tablet     Sig: TAKE 1/2 TABLET BY MOUTH EVERY DAY BEFORE BREAKFAST     Endocrinology:  Diabetes - Sulfonylureas Passed - 11/20/2020  3:48 PM      Passed - HBA1C is between 0 and 7.9 and within 180 days    HB A1C (BAYER DCA - WAIVED)  Date Value Ref Range Status  08/31/2020 5.4 <7.0 % Final    Comment:                                          Diabetic Adult            <7.0                                       Healthy Adult        4.3 - 5.7                                                           (DCCT/NGSP) American Diabetes Association's Summary of Glycemic Recommendations for Adults with Diabetes: Hemoglobin A1c <7.0%. More stringent glycemic goals (A1c <6.0%) may further reduce complications at the cost of increased risk of hypoglycemia.          Passed - Valid encounter within last 6 months    Recent Outpatient Visits          2 months ago Hypertension associated with diabetes [facility])   [facility] [place], [place] T, NP   5 months ago Hypertension associated with diabetes [facility])   [facility] Valentino Nose, NP   9 months ago Type II diabetes mellitus with neurological manifestations (HCC)   [facility] [place], Corrie Dandy T, NP   1 year ago Type II diabetes mellitus with neurological manifestations (HCC)   [facility] [place], Dorie Rank, NP   1 year ago Annual physical exam   [facility] Marjie Skiff, NP      Future Appointments            In 1 month [facility], [facility]            . simvastatin (ZOCOR) 20 MG tablet [Pharmacy Med Name: SIMVASTATIN 20 MG TABLET] 90 tablet 3    Sig: TAKE 1 TABLET BY MOUTH EVERY DAY     Cardiovascular:  Antilipid - Statins Failed - 11/20/2020  3:48 PM      Failed - LDL in normal range and within 360 days    LDL Chol  Calc (NIH)  Date Value Ref Range Status  08/31/2020 45 0 - 99 mg/dL Final         Passed - Total Cholesterol in normal range and within 360 days    Cholesterol, Total  Date Value Ref Range Status  08/31/2020 115 100 - 199 mg/dL Final   Cholesterol Piccolo, Waived  Date Value Ref Range Status  02/26/2019 116 <200 mg/dL Final    Comment:  Desirable                <200                         Borderline High      200- 239                         High                     >239          Passed - HDL in normal range and within 360 days    HDL  Date Value Ref Range Status  08/31/2020 57 >39 mg/dL Final         Passed - Triglycerides in normal range and within 360 days    Triglycerides  Date Value Ref Range Status  08/31/2020 59 0 - 149 mg/dL Final   Triglycerides Piccolo,Waived  Date Value Ref Range Status  02/26/2019 84 <150 mg/dL Final    Comment:                            Normal                   <150                         Borderline High     150 - 199                         High                200 - 499                         Very High                >499          Passed - Patient is not pregnant      Passed - Valid encounter within last 12 months    Recent Outpatient Visits          2 months ago Hypertension associated with diabetes (HCC)   [facility] [place], [place] T, NP   5 months ago Hypertension associated with diabetes (HCC)   [facility] Valentino Nose, NP   9 months ago Type II diabetes mellitus with neurological manifestations (HCC)   [facility] [place], Jolene T, NP   1 year ago Type II diabetes mellitus with neurological manifestations (HCC)   [facility] Cannady, Dorie Rank, NP   1 year ago Annual physical exam   [facility] [place], Corrie Dandy T, NP      Future Appointments            In 1 month [facility], [facility]            .  tamsulosin (FLOMAX) 0.4 MG CAPS capsule [Pharmacy Med Name: TAMSULOSIN HCL 0.4 MG CAPSULE] 90 capsule 3    Sig: TAKE 1 CAPSULE BY MOUTH EVERY DAY     Urology: Alpha-Adrenergic Blocker Passed - 11/20/2020  3:48 PM      Passed - Last BP in normal range    BP Readings from Last 1 Encounters:  08/31/20 116/64  Passed - Valid encounter within last 12 months    Recent Outpatient Visits          2 months ago Hypertension associated with diabetes (HCC)   [facility] [place], [place] T, NP   5 months ago Hypertension associated with diabetes (HCC)   [facility] Valentino NoseMartinez, Jessica A, NP   9 months ago Type II diabetes mellitus with neurological manifestations (HCC)   [facility] [place], Corrie DandyJolene T, NP   1 year ago Type II diabetes mellitus with neurological manifestations (HCC)   [facility] [place], Corrie DandyJolene T, NP   1 year ago Annual physical exam   [facility] [place], Corrie DandyJolene T, NP      Future Appointments            In 1 month [facility]            . enalapril (VASOTEC) 5 MG tablet [Pharmacy Med Name: ENALAPRIL MALEATE 5 MG TABLET] 90 tablet 1    Sig: TAKE 1 TABLET BY MOUTH EVERY DAY     Cardiovascular:  ACE Inhibitors Passed - 11/20/2020  3:48 PM      Passed - Cr in normal range and within 180 days    Creatinine, Ser  Date Value Ref Range Status  08/31/2020 1.13 0.76 - 1.27 mg/dL Final         Passed - K in normal range and within 180 days    Potassium  Date Value Ref Range Status  08/31/2020 4.0 3.5 - 5.2 mmol/L Final         Passed - Patient is not pregnant      Passed - Last BP in normal range    BP Readings from Last 1 Encounters:  08/31/20 116/64         Passed - Valid encounter within last 6 months    Recent Outpatient Visits          2 months ago Hypertension associated with diabetes (HCC)   [facility] [place], [place] T, NP   5 months ago Hypertension  associated with diabetes (HCC)   [facility] Valentino NoseMartinez, Jessica A, NP   9 months ago Type II diabetes mellitus with neurological manifestations (HCC)   [facility] [place], Jolene T, NP   1 year ago Type II diabetes mellitus with neurological manifestations (HCC)   [facility] [place], Dorie RankJolene T, NP   1 year ago Annual physical exam   [facility] [place], Corrie DandyJolene T, NP      Future Appointments            In 1 month [facility]            . metoprolol tartrate (LOPRESSOR) 25 MG tablet [Pharmacy Med Name: METOPROLOL TARTRATE 25 MG TAB] 90 tablet 1    Sig: TAKE 1 TABLET BY MOUTH EVERY DAY     Cardiovascular:  Beta Blockers Passed - 11/20/2020  3:48 PM      Passed - Last BP in normal range    BP Readings from Last 1 Encounters:  08/31/20 116/64         Passed - Last Heart Rate in normal range    Pulse Readings from Last 1 Encounters:  08/31/20 65         Passed - Valid encounter within last 6 months    Recent Outpatient Visits          2 months ago  Hypertension associated with diabetes [facility])   [facility] [place], [place] T, NP   5 months ago Hypertension associated with diabetes [facility])   [facility] Valentino Nose, NP   9 months ago Type II diabetes mellitus with neurological manifestations (HCC)   [facility] [place], Corrie Dandy T, NP   1 year ago Type II diabetes mellitus with neurological manifestations (HCC)   [facility] Cannady, Dorie Rank, NP   1 year ago Annual physical exam   [facility] Marjie Skiff, NP      Future Appointments            In 1 month [facility]

## 2020-12-08 ENCOUNTER — Telehealth: Payer: Self-pay | Admitting: Nurse Practitioner

## 2020-12-08 NOTE — Telephone Encounter (Signed)
Copied from CRM [id number]. Topic: Medicare AWV >> Dec 08, 2020  1:57 PM Geraldine Contras wrote: Reason for CRM:   3/31 No answer- AWVS has been rescheduled from 4/13 to 4/15 @ 1:45 pm and to be completed by phone not in the office-srs

## 2020-12-21 ENCOUNTER — Ambulatory Visit: Payer: Medicare Other

## 2020-12-23 ENCOUNTER — Ambulatory Visit: Payer: Medicare Other

## 2021-01-16 ENCOUNTER — Other Ambulatory Visit: Payer: Self-pay

## 2021-01-16 ENCOUNTER — Ambulatory Visit (INDEPENDENT_AMBULATORY_CARE_PROVIDER_SITE_OTHER): Payer: Medicare Other | Admitting: Internal Medicine

## 2021-01-16 VITALS — BP 126/71 | HR 72

## 2021-01-16 DIAGNOSIS — I700 Atherosclerosis of aorta: Secondary | ICD-10-CM | POA: Diagnosis not present

## 2021-01-16 DIAGNOSIS — E1159 Type 2 diabetes mellitus with other circulatory complications: Secondary | ICD-10-CM

## 2021-01-16 DIAGNOSIS — J431 Panlobular emphysema: Secondary | ICD-10-CM | POA: Diagnosis not present

## 2021-01-16 DIAGNOSIS — I152 Hypertension secondary to endocrine disorders: Secondary | ICD-10-CM

## 2021-01-16 DIAGNOSIS — E1149 Type 2 diabetes mellitus with other diabetic neurological complication: Secondary | ICD-10-CM

## 2021-01-16 DIAGNOSIS — Z950 Presence of cardiac pacemaker: Secondary | ICD-10-CM

## 2021-01-22 NOTE — Assessment & Plan Note (Signed)
Chest is clear heart is regular 

## 2021-01-22 NOTE — Assessment & Plan Note (Signed)
Blood sugar is under control ?

## 2021-01-22 NOTE — Progress Notes (Signed)
Established Patient Office Visit  Subjective:  Patient ID: Jeffery Horne, male    DOB: Jul 30, 1927  Age: 87 y.o. MRN: [id number]  CC:  Chief Complaint  Patient presents with  . Pacemaker Check    HPI  Jeffery Horne presents for pacemaker check and office visit.  Patient came in a wheelchair.  He denies any chest pain or shortness of breath.  Past Medical History:  Diagnosis Date  . BPH (benign prostatic hypertrophy) with urinary obstruction   . Hearing impairment   . Hyperlipidemia   . Hypertension   . Peripheral vascular disease (HCC)   . Primary gout   . Type II diabetes mellitus with neurological manifestations [facility])     Past Surgical History:  Procedure Laterality Date  . HERNIA REPAIR    . PACEMAKER INSERTION     Dr. Juel Burrow    Family History  Problem Relation Age of Onset  . Diabetes Mother   . Hypertension Mother   . Stroke Mother   . Hypertension Father     Social History   Socioeconomic History  . Marital status: Widowed    Spouse name: Not on file  . Number of children: Not on file  . Years of education: Not on file  . Highest education level: GED or equivalent  Occupational History  . Not on file  Tobacco Use  . Smoking status: Former Smoker    Types: Cigarettes    Quit date: 09/11/1971    Years since quitting: 49.4  . Smokeless tobacco: Never Used  Vaping Use  . Vaping Use: Never used  Substance and Sexual Activity  . Alcohol use: No  . Drug use: No  . Sexual activity: Not on file  Other Topics Concern  . Not on file  Social History Narrative  . Not on file   Social Determinants of Health   Financial Resource Strain: Not on file  Food Insecurity: Not on file  Transportation Needs: Not on file  Physical Activity: Not on file  Stress: Not on file  Social Connections: Not on file  Intimate Partner Violence: Not on file     Current Outpatient Medications:  .  aspirin EC 81 MG tablet, Take 81 mg by mouth daily., Disp: , Rfl:   .  clopidogrel (PLAVIX) 75 MG tablet, Take 75 mg by mouth daily., Disp: , Rfl: 7 .  enalapril (VASOTEC) 5 MG tablet, TAKE 1 TABLET BY MOUTH EVERY DAY, Disp: 90 tablet, Rfl: 1 .  fluticasone (FLONASE) 50 MCG/ACT nasal spray, Place 2 sprays into both nostrils daily., Disp: 16 g, Rfl: 12 .  gabapentin (NEURONTIN) 300 MG capsule, Take 2 capsules (600 mg total) by mouth 2 (two) times daily., Disp: 360 capsule, Rfl: 4 .  glucose blood test strip, For testing blood sugar twice per day., Disp: 100 each, Rfl: 12 .  metoprolol tartrate (LOPRESSOR) 25 MG tablet, TAKE 1 TABLET BY MOUTH EVERY DAY, Disp: 90 tablet, Rfl: 1 .  mirtazapine (REMERON) 7.5 MG tablet, Take 1 tablet (7.5 mg total) by mouth at bedtime., Disp: 90 tablet, Rfl: 4 .  simvastatin (ZOCOR) 20 MG tablet, TAKE 1 TABLET BY MOUTH EVERY DAY, Disp: 90 tablet, Rfl: 3 .  tamsulosin (FLOMAX) 0.4 MG CAPS capsule, TAKE 1 CAPSULE BY MOUTH EVERY DAY, Disp: 90 capsule, Rfl: 3   No Known Allergies  ROS Review of Systems  Constitutional: Negative.   HENT: Negative.   Eyes: Negative.   Respiratory: Negative.   Cardiovascular: Negative.  Gastrointestinal: Negative.   Psychiatric/Behavioral: Negative for behavioral problems, confusion, dysphoric mood and hallucinations.      Objective:    Physical Exam Cardiovascular:     Rate and Rhythm: Normal rate.  Pulmonary:     Effort: Pulmonary effort is normal.  Abdominal:     Palpations: Abdomen is soft.     There were no vitals taken for this visit. Wt Readings from Last 3 Encounters:  08/31/20 153 lb 3.2 oz (69.5 kg)  05/30/20 154 lb 12.8 oz (70.2 kg)  02/10/20 171 lb 9.6 oz (77.8 kg)     Health Maintenance Due  Topic Date Due  . COVID-19 Vaccine (3 - Booster for Pfizer series) 04/21/2020    There are no preventive care reminders to display for this patient.  Lab Results  Component Value Date   TSH 0.752 08/31/2020   Lab Results  Component Value Date   WBC 7.3 09/08/2020   HGB  11.2 (L) 09/08/2020   HCT 35.7 (L) 09/08/2020   MCV 97 09/08/2020   PLT 278 09/08/2020   Lab Results  Component Value Date   NA 143 08/31/2020   K 4.0 08/31/2020   CO2 25 08/31/2020   GLUCOSE 141 (H) 08/31/2020   BUN 16 08/31/2020   CREATININE 1.13 08/31/2020   BILITOT 1.1 08/31/2020   ALKPHOS 102 08/31/2020   AST 12 08/31/2020   ALT 15 08/31/2020   PROT 6.2 08/31/2020   ALBUMIN 3.7 08/31/2020   CALCIUM 8.8 08/31/2020   Lab Results  Component Value Date   CHOL 115 08/31/2020   Lab Results  Component Value Date   HDL 57 08/31/2020   Lab Results  Component Value Date   LDLCALC 45 08/31/2020   Lab Results  Component Value Date   TRIG 59 08/31/2020   Lab Results  Component Value Date   CHOLHDL 2.2 10/06/2018   Lab Results  Component Value Date   HGBA1C 5.4 08/31/2020      Assessment & Plan:   Problem List Items Addressed This Visit      Cardiovascular and Mediastinum   Hypertension associated with diabetes (HCC)    Blood pressure is stable.      Aortic atherosclerosis (HCC)    Patient denied chest pain or shortness of breath he was told to follow Mediterranean diet        Respiratory   Emphysema lung (HCC)    Chest is clear heart is regular.        Endocrine   Type II diabetes mellitus with neurological manifestations (HCC)    Blood sugar is under control.        Other   Cardiac pacemaker - Primary    Pacemaker is working well.      Relevant Orders   PACEMAKER IN CLINIC CHECK    Assessment & Plan:  Note: Medical Device Follow-up  Patient pacemaker was interrogated by pacemakers analyzer, battery status is okay.  No programming changes were indicated after the review of the data.  Histogram shows no change since the last interrogation Atrial and ventricular sensing thresholds were found to be acceptable Impedance was checked and it was found to be normal.  Thresholds were found to be okay on evaluation of rhythm problem.  No high rate  or low rate arrhythmia were noted.  Estimated battery longevity is 29 months. I have personally reviewed the device data and amended the report as necessary. No orders of the defined types were placed in this encounter. Follow-up: No  follow-ups on file.    Cletis Athens, MD

## 2021-01-22 NOTE — Assessment & Plan Note (Signed)
Pacemaker is working well 

## 2021-01-22 NOTE — Assessment & Plan Note (Signed)
Patient denied chest pain or shortness of breath he was told to follow Mediterranean diet

## 2021-01-22 NOTE — Assessment & Plan Note (Signed)
Blood pressure is stable 

## 2021-02-23 ENCOUNTER — Telehealth: Payer: Self-pay | Admitting: Nurse Practitioner

## 2021-02-23 NOTE — Telephone Encounter (Signed)
Home Health Verbal Orders - Caller/Agency: [facility]  Callback Number: [phone] vm can be left  Requesting PT Frequency: 2x's a week for 4 weeks

## 2021-02-24 NOTE — Telephone Encounter (Signed)
Verbal orders given  

## 2021-02-28 ENCOUNTER — Telehealth: Payer: Self-pay | Admitting: Nurse Practitioner

## 2021-02-28 NOTE — Telephone Encounter (Signed)
Home Health Verbal Orders - Caller/Agency: [facility] Callback Number: [phone] Requesting OT/PT/Skilled Nursing/Social Work/Speech Therapy: OT Frequency:  2w1 1w4

## 2021-02-28 NOTE — Telephone Encounter (Signed)
Left detailed message for [facility] with [facility] to give her the OK on verbal orders for patient listed in previous message per Jolene. Advised to give our office a call back if she has any questions.

## 2021-03-20 ENCOUNTER — Telehealth: Payer: Self-pay | Admitting: Nurse Practitioner

## 2021-03-20 NOTE — Telephone Encounter (Signed)
Judeth Cornfield OT therapist is calling to request orders.  OT to continue for 1 time a week for 4 weeks.

## 2021-03-20 NOTE — Telephone Encounter (Signed)
Called and left a detailed message with verbal orders for [place].

## 2021-04-12 ENCOUNTER — Inpatient Hospital Stay
Admission: EM | Admit: 2021-04-12 | Discharge: 2021-04-25 | DRG: 177 | Disposition: A | Discharge status: Home Health | Payer: Medicare Other | Source: Skilled Nursing Facility | Attending: Internal Medicine | Admitting: Internal Medicine

## 2021-04-12 ENCOUNTER — Ambulatory Visit (INDEPENDENT_AMBULATORY_CARE_PROVIDER_SITE_OTHER): Payer: Medicare Other | Admitting: Nurse Practitioner

## 2021-04-12 ENCOUNTER — Encounter: Payer: Self-pay | Admitting: Nurse Practitioner

## 2021-04-12 ENCOUNTER — Emergency Department: Payer: Medicare Other

## 2021-04-12 ENCOUNTER — Other Ambulatory Visit: Payer: Self-pay

## 2021-04-12 VITALS — BP 133/68 | HR 76 | Temp 99.8°F

## 2021-04-12 DIAGNOSIS — J690 Pneumonitis due to inhalation of food and vomit: Secondary | ICD-10-CM

## 2021-04-12 DIAGNOSIS — I700 Atherosclerosis of aorta: Secondary | ICD-10-CM

## 2021-04-12 DIAGNOSIS — R509 Fever, unspecified: Secondary | ICD-10-CM

## 2021-04-12 DIAGNOSIS — F05 Delirium due to known physiological condition: Secondary | ICD-10-CM | POA: Diagnosis not present

## 2021-04-12 DIAGNOSIS — E876 Hypokalemia: Secondary | ICD-10-CM | POA: Diagnosis present

## 2021-04-12 DIAGNOSIS — Z7982 Long term (current) use of aspirin: Secondary | ICD-10-CM

## 2021-04-12 DIAGNOSIS — D6489 Other specified anemias: Secondary | ICD-10-CM | POA: Diagnosis present

## 2021-04-12 DIAGNOSIS — J850 Gangrene and necrosis of lung: Secondary | ICD-10-CM | POA: Diagnosis present

## 2021-04-12 DIAGNOSIS — W19XXXA Unspecified fall, initial encounter: Secondary | ICD-10-CM

## 2021-04-12 DIAGNOSIS — E1169 Type 2 diabetes mellitus with other specified complication: Secondary | ICD-10-CM | POA: Diagnosis present

## 2021-04-12 DIAGNOSIS — E43 Unspecified severe protein-calorie malnutrition: Secondary | ICD-10-CM | POA: Diagnosis present

## 2021-04-12 DIAGNOSIS — J189 Pneumonia, unspecified organism: Secondary | ICD-10-CM

## 2021-04-12 DIAGNOSIS — G9341 Metabolic encephalopathy: Secondary | ICD-10-CM | POA: Diagnosis present

## 2021-04-12 DIAGNOSIS — E538 Deficiency of other specified B group vitamins: Secondary | ICD-10-CM

## 2021-04-12 DIAGNOSIS — E1149 Type 2 diabetes mellitus with other diabetic neurological complication: Secondary | ICD-10-CM | POA: Diagnosis present

## 2021-04-12 DIAGNOSIS — Z66 Do not resuscitate: Secondary | ICD-10-CM | POA: Diagnosis present

## 2021-04-12 DIAGNOSIS — R531 Weakness: Secondary | ICD-10-CM

## 2021-04-12 DIAGNOSIS — E785 Hyperlipidemia, unspecified: Secondary | ICD-10-CM | POA: Diagnosis present

## 2021-04-12 DIAGNOSIS — Z79899 Other long term (current) drug therapy: Secondary | ICD-10-CM

## 2021-04-12 DIAGNOSIS — Z833 Family history of diabetes mellitus: Secondary | ICD-10-CM

## 2021-04-12 DIAGNOSIS — I152 Hypertension secondary to endocrine disorders: Secondary | ICD-10-CM | POA: Diagnosis present

## 2021-04-12 DIAGNOSIS — G9340 Encephalopathy, unspecified: Secondary | ICD-10-CM

## 2021-04-12 DIAGNOSIS — F0390 Unspecified dementia without behavioral disturbance: Secondary | ICD-10-CM | POA: Diagnosis present

## 2021-04-12 DIAGNOSIS — R54 Age-related physical debility: Secondary | ICD-10-CM | POA: Diagnosis present

## 2021-04-12 DIAGNOSIS — E1151 Type 2 diabetes mellitus with diabetic peripheral angiopathy without gangrene: Secondary | ICD-10-CM | POA: Diagnosis present

## 2021-04-12 DIAGNOSIS — B9562 Methicillin resistant Staphylococcus aureus infection as the cause of diseases classified elsewhere: Secondary | ICD-10-CM | POA: Diagnosis present

## 2021-04-12 DIAGNOSIS — E1159 Type 2 diabetes mellitus with other circulatory complications: Secondary | ICD-10-CM | POA: Diagnosis present

## 2021-04-12 DIAGNOSIS — Z950 Presence of cardiac pacemaker: Secondary | ICD-10-CM

## 2021-04-12 DIAGNOSIS — J9601 Acute respiratory failure with hypoxia: Secondary | ICD-10-CM

## 2021-04-12 DIAGNOSIS — Z20822 Contact with and (suspected) exposure to covid-19: Secondary | ICD-10-CM | POA: Diagnosis present

## 2021-04-12 DIAGNOSIS — Z6821 Body mass index (BMI) 21.0-21.9, adult: Secondary | ICD-10-CM

## 2021-04-12 DIAGNOSIS — Z87891 Personal history of nicotine dependence: Secondary | ICD-10-CM

## 2021-04-12 DIAGNOSIS — Z8249 Family history of ischemic heart disease and other diseases of the circulatory system: Secondary | ICD-10-CM

## 2021-04-12 DIAGNOSIS — E1142 Type 2 diabetes mellitus with diabetic polyneuropathy: Secondary | ICD-10-CM | POA: Diagnosis present

## 2021-04-12 DIAGNOSIS — Z7902 Long term (current) use of antithrombotics/antiplatelets: Secondary | ICD-10-CM

## 2021-04-12 DIAGNOSIS — J439 Emphysema, unspecified: Secondary | ICD-10-CM | POA: Diagnosis present

## 2021-04-12 DIAGNOSIS — N401 Enlarged prostate with lower urinary tract symptoms: Secondary | ICD-10-CM | POA: Diagnosis present

## 2021-04-12 DIAGNOSIS — I2510 Atherosclerotic heart disease of native coronary artery without angina pectoris: Secondary | ICD-10-CM | POA: Diagnosis present

## 2021-04-12 DIAGNOSIS — A419 Sepsis, unspecified organism: Principal | ICD-10-CM

## 2021-04-12 DIAGNOSIS — M109 Gout, unspecified: Secondary | ICD-10-CM | POA: Diagnosis present

## 2021-04-12 DIAGNOSIS — R0902 Hypoxemia: Secondary | ICD-10-CM | POA: Diagnosis present

## 2021-04-12 DIAGNOSIS — B379 Candidiasis, unspecified: Secondary | ICD-10-CM | POA: Diagnosis present

## 2021-04-12 LAB — MICROSCOPIC EXAMINATION
Bacteria, UA: NONE SEEN
WBC, UA: NONE SEEN /hpf (ref 0–5)

## 2021-04-12 LAB — LACTIC ACID, PLASMA: Lactic Acid, Venous: 1.3 mmol/L (ref 0.5–1.9)

## 2021-04-12 LAB — CBC WITH DIFFERENTIAL/PLATELET
Abs Immature Granulocytes: 0.05 10*3/uL (ref 0.00–0.07)
Basophils Absolute: 0 10*3/uL (ref 0.0–0.1)
Basophils Relative: 0 %
Eosinophils Absolute: 0.1 10*3/uL (ref 0.0–0.5)
Eosinophils Relative: 1 %
HCT: 33 % — ABNORMAL LOW (ref 39.0–52.0)
Hemoglobin: 10.8 g/dL — ABNORMAL LOW (ref 13.0–17.0)
Immature Granulocytes: 1 %
Lymphocytes Relative: 9 %
Lymphs Abs: 0.9 10*3/uL (ref 0.7–4.0)
MCH: 30.1 pg (ref 26.0–34.0)
MCHC: 32.7 g/dL (ref 30.0–36.0)
MCV: 91.9 fL (ref 80.0–100.0)
Monocytes Absolute: 0.8 10*3/uL (ref 0.1–1.0)
Monocytes Relative: 8 %
Neutro Abs: 8.4 10*3/uL — ABNORMAL HIGH (ref 1.7–7.7)
Neutrophils Relative %: 81 %
Platelets: 208 10*3/uL (ref 150–400)
RBC: 3.59 MIL/uL — ABNORMAL LOW (ref 4.22–5.81)
RDW: 14.8 % (ref 11.5–15.5)
WBC: 10.1 10*3/uL (ref 4.0–10.5)
nRBC: 0 % (ref 0.0–0.2)

## 2021-04-12 LAB — URINALYSIS, ROUTINE W REFLEX MICROSCOPIC
Bilirubin, UA: NEGATIVE
Glucose, UA: NEGATIVE
Ketones, UA: NEGATIVE
Leukocytes,UA: NEGATIVE
Nitrite, UA: NEGATIVE
Specific Gravity, UA: 1.02 (ref 1.005–1.030)
Urobilinogen, Ur: 2 mg/dL — ABNORMAL HIGH (ref 0.2–1.0)
pH, UA: 5.5 (ref 5.0–7.5)

## 2021-04-12 LAB — COMPREHENSIVE METABOLIC PANEL
ALT: 13 U/L (ref 0–44)
AST: 23 U/L (ref 15–41)
Albumin: 2.9 g/dL — ABNORMAL LOW (ref 3.5–5.0)
Alkaline Phosphatase: 61 U/L (ref 38–126)
Anion gap: 9 (ref 5–15)
BUN: 23 mg/dL (ref 8–23)
CO2: 28 mmol/L (ref 22–32)
Calcium: 8.7 mg/dL — ABNORMAL LOW (ref 8.9–10.3)
Chloride: 100 mmol/L (ref 98–111)
Creatinine, Ser: 0.74 mg/dL (ref 0.61–1.24)
GFR, Estimated: >60 mL/min (ref >60)
Glucose, Bld: 177 mg/dL — ABNORMAL HIGH (ref 70–99)
Potassium: 3.7 mmol/L (ref 3.5–5.1)
Sodium: 137 mmol/L (ref 135–145)
Total Bilirubin: 1.7 mg/dL — ABNORMAL HIGH (ref 0.3–1.2)
Total Protein: 6.9 g/dL (ref 6.5–8.1)

## 2021-04-12 LAB — PROTIME-INR
INR: 1.4 — ABNORMAL HIGH (ref 0.8–1.2)
Prothrombin Time: 16.8 seconds — ABNORMAL HIGH (ref 11.4–15.2)

## 2021-04-12 LAB — RESP PANEL BY RT-PCR (FLU A&B, COVID) ARPGX2
Influenza A by PCR: NEGATIVE
Influenza B by PCR: NEGATIVE
SARS Coronavirus 2 by RT PCR: NEGATIVE

## 2021-04-12 LAB — BAYER DCA HB A1C WAIVED: HB A1C (BAYER DCA - WAIVED): 5.4 % (ref <7.0)

## 2021-04-12 LAB — TROPONIN I (HIGH SENSITIVITY)
Troponin I (High Sensitivity): 15 ng/L (ref <18)
Troponin I (High Sensitivity): 13 ng/L (ref <18)

## 2021-04-12 MED ORDER — LORAZEPAM 2 MG/ML IJ SOLN
INTRAMUSCULAR | Status: AC
  Filled 2021-04-12: qty 1

## 2021-04-12 MED ORDER — ACETAMINOPHEN 325 MG PO TABS
650.0000 mg | ORAL_TABLET | Freq: Once | ORAL | Status: AC
  Administered 2021-04-12: 650 mg via ORAL
  Filled 2021-04-12: qty 2

## 2021-04-12 NOTE — Assessment & Plan Note (Signed)
Chronic. Stable. Currently controlled with diet. Will check A1C today and adjust as needed.

## 2021-04-12 NOTE — Assessment & Plan Note (Addendum)
Noted on CT scan 03/28/18. Continue aspirin, and plavix. Will check lipid panel.

## 2021-04-12 NOTE — ED Triage Notes (Signed)
Pt to ED with daughter form [facility] for weakness for a couple weeks.  Pt oriented, denies pain Febrile on arrival Daughter reports pt had "choking episode" earlier today  Cari beth in triage assess, orders to be placed

## 2021-04-12 NOTE — Assessment & Plan Note (Signed)
Chronic, ongoing. Continue current regimen. Check lipid panel today and adjust as needed
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
## 2021-04-12 NOTE — ED Provider Notes (Signed)
Emergency Medicine Provider Triage Evaluation Note  Jeffery Horne , a 87 y.o. male  was evaluated in triage.  Pt with daughter who reports he has been progressively weak for the past 2 weeks. She has noticed that he has had difficulty "getting his words out" for the past couple of days. Also, he had a bad choking episode today and is afraid he may have aspirated.  Review of Systems  Positive: Weakness, fever Negative: Chest pain, shortness of breath  Physical Exam  BP (!) 131/56 (BP Location: Left Arm)   Pulse 77   Temp (!) 102.5 F (39.2 C) (Oral)   Resp 20   Ht 5\' 5"  (1.651 m)   Wt 59 kg   SpO2 91%   BMI 21.63 kg/m  Gen:   Awake, no distress   Resp:  Normal effort  MSK:   Moves extremities x 4 Other:  N/a  Medical Decision Making  Medically screening exam initiated at 3:29 PM.  Appropriate orders placed.  Hallinan was informed that the remainder of the evaluation will be completed by another provider, this initial triage assessment does not replace that evaluation, and the importance of remaining in the ED until their evaluation is complete   Vanessa [place], FNP 04/12/21 1534    06/12/21, MD 04/12/21 1726

## 2021-04-13 ENCOUNTER — Encounter: Payer: Self-pay | Admitting: Internal Medicine

## 2021-04-13 DIAGNOSIS — G9341 Metabolic encephalopathy: Secondary | ICD-10-CM | POA: Diagnosis present

## 2021-04-13 DIAGNOSIS — D6489 Other specified anemias: Secondary | ICD-10-CM | POA: Diagnosis present

## 2021-04-13 DIAGNOSIS — J9601 Acute respiratory failure with hypoxia: Secondary | ICD-10-CM | POA: Diagnosis not present

## 2021-04-13 DIAGNOSIS — E1159 Type 2 diabetes mellitus with other circulatory complications: Secondary | ICD-10-CM | POA: Diagnosis not present

## 2021-04-13 DIAGNOSIS — F0390 Unspecified dementia without behavioral disturbance: Secondary | ICD-10-CM | POA: Diagnosis present

## 2021-04-13 DIAGNOSIS — Z7902 Long term (current) use of antithrombotics/antiplatelets: Secondary | ICD-10-CM | POA: Diagnosis not present

## 2021-04-13 DIAGNOSIS — Z87891 Personal history of nicotine dependence: Secondary | ICD-10-CM | POA: Diagnosis not present

## 2021-04-13 DIAGNOSIS — Z66 Do not resuscitate: Secondary | ICD-10-CM | POA: Diagnosis present

## 2021-04-13 DIAGNOSIS — Z7982 Long term (current) use of aspirin: Secondary | ICD-10-CM | POA: Diagnosis not present

## 2021-04-13 DIAGNOSIS — Z833 Family history of diabetes mellitus: Secondary | ICD-10-CM | POA: Diagnosis not present

## 2021-04-13 DIAGNOSIS — E1142 Type 2 diabetes mellitus with diabetic polyneuropathy: Secondary | ICD-10-CM | POA: Diagnosis present

## 2021-04-13 DIAGNOSIS — R531 Weakness: Secondary | ICD-10-CM | POA: Diagnosis not present

## 2021-04-13 DIAGNOSIS — F05 Delirium due to known physiological condition: Secondary | ICD-10-CM | POA: Diagnosis not present

## 2021-04-13 DIAGNOSIS — Z20822 Contact with and (suspected) exposure to covid-19: Secondary | ICD-10-CM | POA: Diagnosis present

## 2021-04-13 DIAGNOSIS — E785 Hyperlipidemia, unspecified: Secondary | ICD-10-CM | POA: Diagnosis present

## 2021-04-13 DIAGNOSIS — J15212 Pneumonia due to Methicillin resistant Staphylococcus aureus: Secondary | ICD-10-CM | POA: Diagnosis not present

## 2021-04-13 DIAGNOSIS — B379 Candidiasis, unspecified: Secondary | ICD-10-CM | POA: Diagnosis present

## 2021-04-13 DIAGNOSIS — E1151 Type 2 diabetes mellitus with diabetic peripheral angiopathy without gangrene: Secondary | ICD-10-CM | POA: Diagnosis present

## 2021-04-13 DIAGNOSIS — Z79899 Other long term (current) drug therapy: Secondary | ICD-10-CM | POA: Diagnosis not present

## 2021-04-13 DIAGNOSIS — J690 Pneumonitis due to inhalation of food and vomit: Secondary | ICD-10-CM | POA: Diagnosis present

## 2021-04-13 DIAGNOSIS — J850 Gangrene and necrosis of lung: Secondary | ICD-10-CM | POA: Diagnosis present

## 2021-04-13 DIAGNOSIS — A419 Sepsis, unspecified organism: Secondary | ICD-10-CM

## 2021-04-13 DIAGNOSIS — E43 Unspecified severe protein-calorie malnutrition: Secondary | ICD-10-CM | POA: Diagnosis present

## 2021-04-13 DIAGNOSIS — Z8249 Family history of ischemic heart disease and other diseases of the circulatory system: Secondary | ICD-10-CM | POA: Diagnosis not present

## 2021-04-13 DIAGNOSIS — E1149 Type 2 diabetes mellitus with other diabetic neurological complication: Secondary | ICD-10-CM | POA: Diagnosis present

## 2021-04-13 DIAGNOSIS — J439 Emphysema, unspecified: Secondary | ICD-10-CM | POA: Diagnosis present

## 2021-04-13 DIAGNOSIS — E1169 Type 2 diabetes mellitus with other specified complication: Secondary | ICD-10-CM | POA: Diagnosis present

## 2021-04-13 DIAGNOSIS — Z7189 Other specified counseling: Secondary | ICD-10-CM | POA: Diagnosis not present

## 2021-04-13 DIAGNOSIS — B9562 Methicillin resistant Staphylococcus aureus infection as the cause of diseases classified elsewhere: Secondary | ICD-10-CM | POA: Diagnosis present

## 2021-04-13 DIAGNOSIS — Z515 Encounter for palliative care: Secondary | ICD-10-CM | POA: Diagnosis not present

## 2021-04-13 DIAGNOSIS — I152 Hypertension secondary to endocrine disorders: Secondary | ICD-10-CM | POA: Diagnosis present

## 2021-04-13 LAB — COMPREHENSIVE METABOLIC PANEL
ALT: 11 IU/L (ref 0–44)
AST: 22 IU/L (ref 0–40)
Albumin/Globulin Ratio: 1 — ABNORMAL LOW (ref 1.2–2.2)
Albumin: 3.3 g/dL — ABNORMAL LOW (ref 3.5–4.6)
Alkaline Phosphatase: 80 IU/L (ref 44–121)
BUN/Creatinine Ratio: 25 — ABNORMAL HIGH (ref 10–24)
BUN: 20 mg/dL (ref 10–36)
Bilirubin Total: 1.3 mg/dL — ABNORMAL HIGH (ref 0.0–1.2)
CO2: 27 mmol/L (ref 20–29)
Calcium: 8.6 mg/dL (ref 8.6–10.2)
Chloride: 98 mmol/L (ref 96–106)
Creatinine, Ser: 0.79 mg/dL (ref 0.76–1.27)
Globulin, Total: 3.2 g/dL (ref 1.5–4.5)
Glucose: 152 mg/dL — ABNORMAL HIGH (ref 65–99)
Potassium: 3.8 mmol/L (ref 3.5–5.2)
Sodium: 140 mmol/L (ref 134–144)
Total Protein: 6.5 g/dL (ref 6.0–8.5)
eGFR: 83 mL/min/{1.73_m2} (ref >59)

## 2021-04-13 LAB — URINALYSIS, COMPLETE (UACMP) WITH MICROSCOPIC
Bacteria, UA: NONE SEEN
Bilirubin Urine: NEGATIVE
Glucose, UA: NEGATIVE mg/dL
Hgb urine dipstick: NEGATIVE
Ketones, ur: NEGATIVE mg/dL
Leukocytes,Ua: NEGATIVE
Nitrite: NEGATIVE
Protein, ur: 30 mg/dL — AB
Specific Gravity, Urine: 1.026 (ref 1.005–1.030)
pH: 5 (ref 5.0–8.0)

## 2021-04-13 LAB — CBC
HCT: 31.5 % — ABNORMAL LOW (ref 39.0–52.0)
Hemoglobin: 10.2 g/dL — ABNORMAL LOW (ref 13.0–17.0)
MCH: 30.3 pg (ref 26.0–34.0)
MCHC: 32.4 g/dL (ref 30.0–36.0)
MCV: 93.5 fL (ref 80.0–100.0)
Platelets: 197 10*3/uL (ref 150–400)
RBC: 3.37 MIL/uL — ABNORMAL LOW (ref 4.22–5.81)
RDW: 14.9 % (ref 11.5–15.5)
WBC: 10.8 10*3/uL — ABNORMAL HIGH (ref 4.0–10.5)
nRBC: 0 % (ref 0.0–0.2)

## 2021-04-13 LAB — CBC WITH DIFFERENTIAL/PLATELET
Basophils Absolute: 0 10*3/uL (ref 0.0–0.2)
Basos: 0 %
EOS (ABSOLUTE): 0.1 10*3/uL (ref 0.0–0.4)
Eos: 1 %
Hematocrit: 37 % — ABNORMAL LOW (ref 37.5–51.0)
Hemoglobin: 11.4 g/dL — ABNORMAL LOW (ref 13.0–17.7)
Immature Grans (Abs): 0 10*3/uL (ref 0.0–0.1)
Immature Granulocytes: 0 %
Lymphocytes Absolute: 1 10*3/uL (ref 0.7–3.1)
Lymphs: 11 %
MCH: 28.3 pg (ref 26.6–33.0)
MCHC: 30.8 g/dL — ABNORMAL LOW (ref 31.5–35.7)
MCV: 92 fL (ref 79–97)
Monocytes Absolute: 0.8 10*3/uL (ref 0.1–0.9)
Monocytes: 8 %
Neutrophils Absolute: 7.4 10*3/uL — ABNORMAL HIGH (ref 1.4–7.0)
Neutrophils: 80 %
Platelets: 217 10*3/uL (ref 150–450)
RBC: 4.03 x10E6/uL — ABNORMAL LOW (ref 4.14–5.80)
RDW: 13.6 % (ref 11.6–15.4)
WBC: 9.3 10*3/uL (ref 3.4–10.8)

## 2021-04-13 LAB — CREATININE, SERUM
Creatinine, Ser: 0.81 mg/dL (ref 0.61–1.24)
GFR, Estimated: >60 mL/min (ref >60)

## 2021-04-13 LAB — LIPID PANEL W/O CHOL/HDL RATIO
Cholesterol, Total: 141 mg/dL (ref 100–199)
HDL: 53 mg/dL (ref >39)
LDL Chol Calc (NIH): 76 mg/dL (ref 0–99)
Triglycerides: 55 mg/dL (ref 0–149)
VLDL Cholesterol Cal: 12 mg/dL (ref 5–40)

## 2021-04-13 LAB — HEMOGLOBIN A1C
Hgb A1c MFr Bld: 5.7 % — ABNORMAL HIGH (ref 4.8–5.6)
Mean Plasma Glucose: 116.89 mg/dL

## 2021-04-13 LAB — PROCALCITONIN: Procalcitonin: 0.39 ng/mL

## 2021-04-13 LAB — PROTIME-INR
INR: 1.5 — ABNORMAL HIGH (ref 0.8–1.2)
Prothrombin Time: 18.1 seconds — ABNORMAL HIGH (ref 11.4–15.2)

## 2021-04-13 LAB — VITAMIN D 25 HYDROXY (VIT D DEFICIENCY, FRACTURES): Vit D, 25-Hydroxy: 46.7 ng/mL (ref 30.0–100.0)

## 2021-04-13 LAB — CORTISOL-AM, BLOOD: Cortisol - AM: 23.8 ug/dL — ABNORMAL HIGH (ref 6.7–22.6)

## 2021-04-13 LAB — VITAMIN B12: Vitamin B-12: 424 pg/mL (ref 232–1245)

## 2021-04-13 MED ORDER — LACTATED RINGERS IV BOLUS (SEPSIS)
1000.0000 mL | Freq: Once | INTRAVENOUS | Status: AC
  Administered 2021-04-13: 1000 mL via INTRAVENOUS

## 2021-04-13 MED ORDER — TAMSULOSIN HCL 0.4 MG PO CAPS
0.4000 mg | ORAL_CAPSULE | Freq: Every day | ORAL | Status: DC
  Administered 2021-04-13 – 2021-04-25 (×13): 0.4 mg via ORAL
  Filled 2021-04-13 (×13): qty 1

## 2021-04-13 MED ORDER — SODIUM CHLORIDE 0.9 % IV SOLN: 2.0000 g | INTRAVENOUS | Status: DC

## 2021-04-13 MED ORDER — METOPROLOL TARTRATE 25 MG PO TABS
25.0000 mg | ORAL_TABLET | Freq: Every day | ORAL | Status: DC
  Administered 2021-04-13 – 2021-04-20 (×8): 25 mg via ORAL
  Filled 2021-04-13 (×8): qty 1

## 2021-04-13 MED ORDER — SODIUM CHLORIDE 0.9 % IV SOLN
3.0000 g | INTRAVENOUS | Status: AC
  Administered 2021-04-13: 3 g via INTRAVENOUS
  Filled 2021-04-13: qty 8

## 2021-04-13 MED ORDER — ACETAMINOPHEN 325 MG PO TABS
650.0000 mg | ORAL_TABLET | Freq: Four times a day (QID) | ORAL | Status: DC | PRN
  Administered 2021-04-13 – 2021-04-24 (×15): 650 mg via ORAL
  Filled 2021-04-13 (×15): qty 2

## 2021-04-13 MED ORDER — SODIUM CHLORIDE 0.9 % IV SOLN: INTRAVENOUS | Status: AC

## 2021-04-13 MED ORDER — ENOXAPARIN SODIUM 40 MG/0.4ML IJ SOSY
40.0000 mg | PREFILLED_SYRINGE | INTRAMUSCULAR | Status: DC
  Administered 2021-04-13 – 2021-04-25 (×13): 40 mg via SUBCUTANEOUS
  Filled 2021-04-13 (×13): qty 0.4

## 2021-04-13 MED ORDER — MUSCLE RUB 10-15 % EX CREA
TOPICAL_CREAM | CUTANEOUS | Status: DC | PRN
  Administered 2021-04-14 – 2021-04-16 (×2): 1 via TOPICAL
  Filled 2021-04-13: qty 85

## 2021-04-13 MED ORDER — ONDANSETRON HCL 4 MG PO TABS: 4.0000 mg | ORAL_TABLET | Freq: Four times a day (QID) | ORAL | Status: DC | PRN

## 2021-04-13 MED ORDER — ASPIRIN EC 81 MG PO TBEC
81.0000 mg | DELAYED_RELEASE_TABLET | Freq: Every day | ORAL | Status: DC
  Administered 2021-04-13 – 2021-04-25 (×13): 81 mg via ORAL
  Filled 2021-04-13 (×13): qty 1

## 2021-04-13 MED ORDER — SENNOSIDES-DOCUSATE SODIUM 8.6-50 MG PO TABS
1.0000 | ORAL_TABLET | Freq: Every evening | ORAL | Status: DC | PRN
  Filled 2021-04-13: qty 1

## 2021-04-13 MED ORDER — MIRTAZAPINE 15 MG PO TABS
7.5000 mg | ORAL_TABLET | Freq: Every day | ORAL | Status: DC
  Administered 2021-04-13 – 2021-04-19 (×7): 7.5 mg via ORAL
  Filled 2021-04-13 (×7): qty 1

## 2021-04-13 MED ORDER — SODIUM CHLORIDE 0.9 % IV SOLN
3.0000 g | Freq: Four times a day (QID) | INTRAVENOUS | Status: DC
  Administered 2021-04-13: 3 g via INTRAVENOUS
  Filled 2021-04-13 (×4): qty 8

## 2021-04-13 MED ORDER — GABAPENTIN 300 MG PO CAPS
600.0000 mg | ORAL_CAPSULE | Freq: Two times a day (BID) | ORAL | Status: DC
  Administered 2021-04-13 – 2021-04-25 (×24): 600 mg via ORAL
  Filled 2021-04-13 (×24): qty 2

## 2021-04-13 MED ORDER — SODIUM CHLORIDE 0.9 % IV SOLN
500.0000 mg | INTRAVENOUS | Status: DC
  Administered 2021-04-13 – 2021-04-17 (×5): 500 mg via INTRAVENOUS
  Filled 2021-04-13 (×8): qty 500

## 2021-04-13 MED ORDER — ONDANSETRON HCL 4 MG/2ML IJ SOLN: 4.0000 mg | Freq: Four times a day (QID) | INTRAMUSCULAR | Status: DC | PRN

## 2021-04-13 MED ORDER — ACETAMINOPHEN 650 MG RE SUPP: 650.0000 mg | Freq: Four times a day (QID) | RECTAL | Status: DC | PRN

## 2021-04-13 MED ORDER — CLOPIDOGREL BISULFATE 75 MG PO TABS
75.0000 mg | ORAL_TABLET | Freq: Every day | ORAL | Status: DC
  Administered 2021-04-13 – 2021-04-25 (×13): 75 mg via ORAL
  Filled 2021-04-13 (×13): qty 1

## 2021-04-13 MED ORDER — SODIUM CHLORIDE 0.9 % IV SOLN
2.0000 g | INTRAVENOUS | Status: AC
  Administered 2021-04-13 – 2021-04-17 (×5): 2 g via INTRAVENOUS
  Filled 2021-04-13 (×4): qty 2
  Filled 2021-04-13 (×2): qty 20

## 2021-04-13 NOTE — Evaluation (Signed)
Clinical/Bedside Swallow Evaluation Patient Details  Name: Jeffery Horne MRN: [id number] Date of Birth: 1926/10/23  Today's Date: 04/13/2021 Time: SLP Start Time (ACUTE ONLY): 1350 SLP Stop Time (ACUTE ONLY): 1418 SLP Time Calculation (min) (ACUTE ONLY): 28 min  Past Medical History:  Past Medical History:  Diagnosis Date   BPH (benign prostatic hypertrophy) with urinary obstruction    Hearing impairment    Hyperlipidemia    Hypertension    Peripheral vascular disease (HCC)    Primary gout    Type II diabetes mellitus with neurological manifestations (HCC)    Past Surgical History:  Past Surgical History:  Procedure Laterality Date   HERNIA REPAIR     PACEMAKER INSERTION     Dr. Juel Burrow   HPI:  Jeffery Horne is a 87 y.o. male with medical history significant for BPH, HTN, DM, PVD, resident of an assisted living facility who was sent to the ED for evaluation of generalized weakness and confusion that appeared to have started several hours following a choking episode at the facility.  CT head with no acute intracranial abnormality.  Chronic appearing left basal ganglia lacunar infarct Chest x-ray new patchy interstitial airspace opacities in the right lung concerning for aspiration and/or pneumonia.   Assessment / Plan / Recommendation Clinical Impression  Pt demonstrated adequate oropharyngeal abilities when consuming trials of regular, puree and thin liquids via straw. SLP assisted pt with putting dentures in mouth. He consumed all textures in a timely manner with complete oral clearing. When consuming thin liquids via straw, his swallow appeared swift with no overt s/s of aspiration at bedside. Education provided to pt's son at bedside. Pt's risk of aspiration is considered high with all PO intake given age and comorbidities. Son voiced understanding. At this time, recommend age appropriate diet with thin liquids via straw, medicine whole with thin liquids. SLP Visit Diagnosis:  Dysphagia, unspecified (R13.10)    Aspiration Risk  Mild aspiration risk    Diet Recommendation Regular;Thin liquid   Liquid Administration via: Straw;Cup Medication Administration: Whole meds with liquid Supervision: Patient able to self feed Compensations: Minimize environmental distractions;Slow rate;Small sips/bites Postural Changes: Seated upright at 90 degrees;Remain upright for at least 30 minutes after po intake    Other  Recommendations Oral Care Recommendations: Oral care BID   Follow up Recommendations None      Frequency and Duration   N/A         Prognosis   N/A     Swallow Study   General Date of Onset: 04/12/21 HPI: Jeffery Horne is a 87 y.o. male with medical history significant for BPH, HTN, DM, PVD, resident of an assisted living facility who was sent to the ED for evaluation of generalized weakness and confusion that appeared to have started several hours following a choking episode at the facility.  CT head with no acute intracranial abnormality.  Chronic appearing left basal ganglia lacunar infarct Chest x-ray new patchy interstitial airspace opacities in the right lung concerning for aspiration and/or pneumonia. Type of Study: Bedside Swallow Evaluation Previous Swallow Assessment: none in chart Diet Prior to this Study: NPO Temperature Spikes Noted: No Respiratory Status: Room air History of Recent Intubation: No Behavior/Cognition: Alert Oral Cavity Assessment: Within Functional Limits Oral Care Completed by SLP: No Oral Cavity - Dentition: Dentures, top;Dentures, bottom Vision: Functional for self-feeding Self-Feeding Abilities: Able to feed self Patient Positioning: Upright in chair Baseline Vocal Quality: Normal Volitional Cough: Strong Volitional Swallow: Unable to elicit  Oral/Motor/Sensory Function Overall Oral Motor/Sensory Function: Within functional limits   Ice Chips Ice chips: Not tested   Thin Liquid Thin Liquid: Within  functional limits Presentation: Self Fed;Straw    Nectar Thick Nectar Thick Liquid: Not tested   Honey Thick Honey Thick Liquid: Not tested   Puree Puree: Within functional limits Presentation: Spoon   Solid     Solid: Within functional limits Presentation: Self Fed     Palak Tercero B. Dreama Saa M.S., CCC-SLP, [facility] Speech-Language Pathologist Rehabilitation Services Office [phone]  Devanshi Califf Dreama Saa 04/13/2021,3:09 PM

## 2021-04-13 NOTE — Evaluation (Signed)
Physical Therapy Evaluation Patient Details Name: Jeffery Horne MRN: [id number] DOB: 07/15/1927 Today's Date: 04/13/2021   History of Present Illness  As per MD: Jeffery Horne is a 87 y.o. male with medical history significant for BPH, HTN, DM, PVD, resident of an assisted living facility who was sent to the ED for evaluation of generalized weakness and confusion that appeared to have started several hours following a choking episode at the facility.  Over the past several days prior to the choking episode patient, who at baseline can care for himself with little assistance, has been declining and had generalized weakness.  Following the episode, he gradually appeared more confused, more lethargic and they reported O2 levels down to 89% on room air.  History is limited due to lethargy but there have been no prior reports of vomiting or diarrhea, abdominal pain or complaints of chest pain or shortness of breath. Pt, No longer on O2 via [place] and is able ot maintain SPO2>93% at all time.  Clinical Impression  Jeffery Horne is  confused, HOH but pleasant. Pt agreed to participate in PT evaluation. Pt SPO2 remained >93% at all time during this session. Pt received in bed. Pt performed Bed mobility with mod to max assist to assume upright sitting posture and balance with x VC for hand placement. Pt performed Stand Pivot transfers with Mod assist of 1 with Mod VS for direction and Hand and foot placement. As per daughter, pt was I with transfers Bed<->W.C <-> Toilet and used FWW to ambulate in the hallways with one person min to CGA assist followed by W/C. Pt I with ADL except needs assist with Shoe lace. Pt's daughter will bring Velcro shoes. Pt/ daughter plans to return pt to [place]. PT recommendation to     Follow Up Recommendations Home health PT (Return to ALF/[facility])    Equipment Recommendations   (pt has W/C and FWW)    Recommendations for Other Services       Precautions / Restrictions  Restrictions Weight Bearing Restrictions: No      Mobility  Bed Mobility Overal bed mobility: Needs Assistance Bed Mobility: Supine to Sit     Supine to sit: Max assist          Transfers Overall transfer level: Needs assistance Equipment used: Rolling walker (2 wheeled) Transfers: Sit to/from [employer] Sit to Stand: Mod assist Stand pivot transfers: Mod assist          Ambulation/Gait Ambulation/Gait assistance: Mod assist Gait Distance (Feet): 1 Feet Assistive device: Rolling walker (2 wheeled) Gait Pattern/deviations: Shuffle;Trunk flexed Gait velocity: decreased      Stairs            Wheelchair Mobility    Modified Rankin (Stroke Patients Only)       Balance Overall balance assessment: Needs assistance Sitting-balance support: Bilateral upper extremity supported Sitting balance-Leahy Scale: Fair     Standing balance support: Bilateral upper extremity supported Standing balance-Leahy Scale: Poor                               Pertinent Vitals/Pain Pain Assessment: No/denies pain    Home Living Family/patient expects to be discharged to:: Assisted living               Home Equipment: Walker - 2 wheels      Prior Function Level of Independence: Needs assistance   Gait / Transfers Assistance Needed: Indepedent  with Transfers but One person assist fro ambulation using FW and  follwed by W/C  ADL's / Homemaking Assistance Needed: Needed assist with shoe ans sock otherwise independent        Hand Dominance        Extremity/Trunk Assessment   Upper Extremity Assessment Upper Extremity Assessment: Overall WFL for tasks assessed    Lower Extremity Assessment Lower Extremity Assessment: Generalized weakness       Communication   Communication: HOH  Cognition Arousal/Alertness: Awake/alert Behavior During Therapy: Agitated (with VCs) Overall Cognitive Status: Impaired/Different from  baseline                                        General Comments      Exercises     Assessment/Plan    PT Assessment Patient needs continued PT services  PT Problem List Decreased strength;Decreased activity tolerance;Decreased balance;Decreased mobility;Decreased coordination;Decreased cognition;Decreased knowledge of use of DME;Decreased safety awareness;Cardiopulmonary status limiting activity       PT Treatment Interventions Gait training;Functional mobility training;Therapeutic activities;Therapeutic exercise;Balance training;Neuromuscular re-education;Patient/family education    PT Goals (Current goals can be found in the Care Plan section)  Acute Rehab PT Goals Patient Stated Goal: As per daughter: " i want my dad to go back to the way he was doing things by himself." PT Goal Formulation: Patient unable to participate in goal setting (with daughter) Potential to Achieve Goals: Good    Frequency Min 2X/week   Barriers to discharge        Co-evaluation     PT goals addressed during session: Mobility/safety with mobility;Proper use of DME;Strengthening/ROM;Balance (MMT, Sensation,)         AM-PAC PT "6 Clicks" Mobility  Outcome Measure Help needed turning from your back to your side while in a flat bed without using bedrails?: A Little Help needed moving from lying on your back to sitting on the side of a flat bed without using bedrails?: A Lot Help needed moving to and from a bed to a chair (including a wheelchair)?: A Lot Help needed standing up from a chair using your arms (e.g., wheelchair or bedside chair)?: A Lot Help needed to walk in hospital room?: Total Help needed climbing 3-5 steps with a railing? : Total 6 Click Score: 11    End of Session Equipment Utilized During Treatment: Gait belt Activity Tolerance: Patient tolerated treatment well Patient left: in chair;with call bell/phone within reach;with chair alarm set Nurse  Communication: Mobility status PT Visit Diagnosis: Unsteadiness on feet (R26.81);Muscle weakness (generalized) (M62.81);Difficulty in walking, not elsewhere classified (R26.2)    Time: 5681-2751 PT Time Calculation (min) (ACUTE ONLY): 23 min   Charges:   PT Evaluation $PT Eval Moderate Complexity: 1 Mod        Jeffery Horne PT DPT 12:07 PM,04/13/21

## 2021-04-13 NOTE — Progress Notes (Signed)
Pt admitted to 1C from ED early this AM. Daughter at bedside to aid with admission questions. Oriented to room and call bell. Oriented to self, VSS. Telemetry initiated. Fall precautions initiated, pt and daughter educated. Will continue with POC.

## 2021-04-13 NOTE — ED Provider Notes (Signed)
[facility] Emergency Department Provider Note  ____________________________________________   Event Date/Time   First MD Initiated Contact with Patient 04/13/21 0002     (approximate)  I have reviewed the triage vital signs and the nursing notes.   HISTORY  Chief Complaint Weakness  Level 5 caveat:  history/ROS limited by acute/critical illness  HPI Jeffery Horne is a 87 y.o. male with medical history as listed below and typically lives in assisted living.  He presents for evaluation of generalized weakness, increased confusion or altered mental status, oxygen level down to 89% at his assisted living facility, and an aspiration event at some point within the last 24 hours.  Normally he is able to care for himself with a little bit of assistance, but gradually over the last few days he has declined in status substantially.  His daughter was unaware of the patient's fever until coming to the ED tonight.  However she reports that he had a choking episode at some point earlier in the day yesterday that was very severe and acute in onset and she is concerned that he might have aspirated something.  The patient is oriented to himself but otherwise confused and not able to provide any history.  He denies any pain.  He denies feeling short of breath.  Nothing in particular seems to make the symptoms better or worse.     Past Medical History:  Diagnosis Date   BPH (benign prostatic hypertrophy) with urinary obstruction    Hearing impairment    Hyperlipidemia    Hypertension    Peripheral vascular disease (HCC)    Primary gout    Type II diabetes mellitus with neurological manifestations [facility])     Patient Active Problem List   Diagnosis Date Noted   Aspiration pneumonia (HCC) 04/13/2021   Sepsis (HCC) 04/13/2021   Generalized weakness 04/13/2021   Protein-calorie malnutrition (HCC) 08/31/2020   Syncope 08/31/2020   Emphysema lung (HCC) 08/26/2020    Aortic atherosclerosis (HCC) 08/26/2020   Cardiac pacemaker 04/23/2020   Carotid artery stenosis, symptomatic, right 03/20/2018   Advanced care planning/counseling discussion 10/01/2017   Skin lesion of face 05/16/2016   Sick sinus syndrome (HCC) 07/28/2015   BPH with obstruction/lower urinary tract symptoms 07/28/2015   Hearing impairment 07/28/2015   Gait abnormality 05/25/2015   Arthritis of ankle, left 05/11/2015   Chronic hoarseness 05/11/2015   Hypertension associated with diabetes (HCC)    Type II diabetes mellitus with neurological manifestations (HCC)    Hyperlipidemia associated with type 2 diabetes mellitus (HCC)     Past Surgical History:  Procedure Laterality Date   HERNIA REPAIR     PACEMAKER INSERTION     Dr. Juel Burrow    Prior to Admission medications   Medication Sig Start Date End Date Taking? Authorizing Provider  aspirin EC 81 MG tablet Take 81 mg by mouth daily.   Yes [provider]  clopidogrel (PLAVIX) 75 MG tablet Take 75 mg by mouth daily. 01/10/18  Yes [provider]  clotrimazole-betamethasone (LOTRISONE) cream Apply 1 application topically 2 (two) times daily. 12/31/20  Yes [provider]  Emollient (EUCERIN INTENSIVE REPAIR) LOTN Apply 1 application topically at bedtime. 11/09/20  Yes [provider]  fexofenadine (ALLEGRA) 180 MG tablet Take 180 mg by mouth daily.   Yes [provider]  gabapentin (NEURONTIN) 300 MG capsule Take 2 capsules (600 mg total) by mouth 2 (two) times daily. 02/10/20  Yes Cannady, Dorie Rank, NP  glucose  blood test strip For testing blood sugar twice per day. 11/05/19  Yes Cannady, Jolene T, NP  hydrocortisone 1 % ointment Apply 1 application topically at bedtime. 11/09/20  Yes [provider]  metoprolol tartrate (LOPRESSOR) 25 MG tablet TAKE 1 TABLET BY MOUTH EVERY DAY 11/20/20  Yes Cannady, Jolene T, NP  mirtazapine (REMERON) 7.5 MG tablet Take 1 tablet (7.5 mg total) by mouth at  bedtime. 08/31/20  Yes Cannady, Corrie DandyJolene T, NP  Multiple Vitamin (MULTIVITAMIN WITH MINERALS) TABS tablet Take 1 tablet by mouth daily.   Yes [provider]  tamsulosin (FLOMAX) 0.4 MG CAPS capsule TAKE 1 CAPSULE BY MOUTH EVERY DAY 11/20/20  Yes Cannady, Jolene T, NP  vitamin B-12 (CYANOCOBALAMIN) 1000 MCG tablet Take 1,000 mcg by mouth daily.   Yes [provider]    Allergies Patient has no known allergies.  Family History  Problem Relation Age of Onset   Diabetes Mother    Hypertension Mother    Stroke Mother    Hypertension Father     Social History Social History   Tobacco Use   Smoking status: Former    Types: Cigarettes    Quit date: 09/11/1971    Years since quitting: 49.6   Smokeless tobacco: Never  Vaping Use   Vaping Use: Never used  Substance Use Topics   Alcohol use: No   Drug use: No    Review of Systems Level 5 caveat:  history/ROS limited by acute/critical illness   ____________________________________________   PHYSICAL EXAM:  VITAL SIGNS: ED Triage Vitals  Enc Vitals Group     BP 04/12/21 1521 (!) 131/56     Pulse Rate 04/12/21 1521 77     Resp 04/12/21 1521 20     Temp 04/12/21 1521 (!) 102.5 F (39.2 C)     Temp Source 04/12/21 1521 Oral     SpO2 04/12/21 1521 91 %     Weight 04/12/21 1523 59 kg (130 lb)     Height 04/12/21 1523 1.651 m (5\' 5" )     Head Circumference --      Peak Flow --      Pain Score 04/12/21 1523 0     Pain Loc --      Pain Edu? --      Excl. in GC? --     Constitutional: Alert and oriented to self only.  Elderly and deconditioned but does not appear to be in substantial distress currently. Eyes: Conjunctivae are normal.  Head: Atraumatic. Nose: No congestion/rhinnorhea. Mouth/Throat: Patient is wearing a mask. Neck: No stridor.  No meningeal signs.   Cardiovascular: Normal rate, regular rhythm. Good peripheral circulation.  Pacemaker in place. Respiratory: Normal respiratory effort.  No  retractions.  Minimal coarse breath sounds, question upper airway noise referral. Gastrointestinal: Soft and nontender. No distention.  Musculoskeletal: No lower extremity tenderness nor edema. No gross deformities of extremities. Neurologic:  Normal speech and language. No gross focal neurologic deficits are appreciated.  However the patient is generally weak all over and requiring multiple people assisting him with transfers which is not typically the case. Skin:  Skin is warm, dry and intact. Psychiatric: Mood and affect are normal. Speech and behavior are normal.  ____________________________________________   LABS (all labs ordered are listed, but only abnormal results are displayed)  Labs Reviewed  COMPREHENSIVE METABOLIC PANEL - Abnormal; Notable for the following components:      Result Value   Glucose, Bld 177 (*)    Calcium 8.7 (*)  Albumin 2.9 (*)    Total Bilirubin 1.7 (*)    All other components within normal limits  CBC WITH DIFFERENTIAL/PLATELET - Abnormal; Notable for the following components:   RBC 3.59 (*)    Hemoglobin 10.8 (*)    HCT 33.0 (*)    Neutro Abs 8.4 (*)    All other components within normal limits  URINALYSIS, COMPLETE (UACMP) WITH MICROSCOPIC - Abnormal; Notable for the following components:   Color, Urine AMBER (*)    APPearance HAZY (*)    Protein, ur 30 (*)    All other components within normal limits  PROTIME-INR - Abnormal; Notable for the following components:   Prothrombin Time 16.8 (*)    INR 1.4 (*)    All other components within normal limits  RESP PANEL BY RT-PCR (FLU A&B, COVID) ARPGX2  CULTURE, BLOOD (SINGLE)  CULTURE, BLOOD (SINGLE)  LACTIC ACID, PLASMA  PROTIME-INR  CORTISOL-AM, BLOOD  PROCALCITONIN  CBC  CREATININE, SERUM  TROPONIN I (HIGH SENSITIVITY)  TROPONIN I (HIGH SENSITIVITY)   ____________________________________________  EKG  ED ECG REPORT I, Loleta Rose, the attending physician, personally viewed and  interpreted this ECG.  Date: 04/12/2021 EKG Time: 15: 30 Rate: 77 Rhythm: normal sinus rhythm QRS Axis: Right superior axis deviation Intervals: normal ST/T Wave abnormalities: Non-specific ST segment / T-wave changes, but no clear evidence of acute ischemia. Narrative Interpretation: no definitive evidence of acute ischemia; does not meet STEMI criteria.  ____________________________________________  RADIOLOGY I, Loleta Rose, personally viewed and evaluated these images (plain radiographs) as part of my medical decision making, as well as reviewing the written report by the radiologist.  ED MD interpretation: No acute intracranial abnormality on CT head.  Chest x-ray concerning for new patchy airspace opacities in the right lung, question aspiration versus pneumonia.  Official radiology report(s): DG Chest 2 View  Result Date: 04/12/2021 CLINICAL DATA:  questionable aspiration EXAM: CHEST - 2 VIEW COMPARISON:  None. FINDINGS: Patient positioning/rotation limits evaluation. New patchy interstitial airspace opacities in the right lung. Left lung is clear. No visible pleural effusions or pneumothorax. Cardiomediastinal silhouette is likely within normal limits when accounting for patient rotation. Left subclavian approach dual lead cardiac rhythm maintenance device. Exaggerated thoracic kyphosis with multilevel thoracic degenerative change and osteopenia. IMPRESSION: New patchy interstitial airspace opacities in the right lung, concerning for aspiration and/or pneumonia. Electronically Signed   By: Feliberto Harts MD   On: 04/12/2021 17:04   CT HEAD WO CONTRAST ( )  Result Date: 04/12/2021 CLINICAL DATA:  Mental status change, unknown cause. Additional history provided: Patient's daughter repeats patient experiencing progressive weakness for the past 2 weeks, patient having "difficulty getting words out." EXAM: CT HEAD WITHOUT CONTRAST TECHNIQUE: Contiguous axial images were obtained from  the base of the skull through the vertex without intravenous contrast. COMPARISON:  Head CT 01/11/2011. FINDINGS: Brain: Mild-to-moderate generalized cerebral atrophy. Comparatively mild cerebellar atrophy. Mild patchy and ill-defined hypoattenuation within the cerebral white matter, nonspecific but compatible with chronic small vessel ischemic disease. Chronic appearing lacunar infarct within the left caudate nucleus. There is no acute intracranial hemorrhage. No demarcated cortical infarct. No extra-axial fluid collection. No evidence of an intracranial mass. No midline shift. Vascular: No hyperdense vessel.  Atherosclerotic calcifications. Skull: Normal. Negative for fracture or focal lesion. Sinuses/Orbits: Visualized orbits show no acute finding. Postsurgical appearance of the paranasal sinuses. Mild mucosal thickening within the bilateral ethmoid air cells/ethmoidectomy cavities. Small right maxillary sinus fluid level. IMPRESSION: No evidence of acute intracranial abnormality. Mild  chronic small vessel ischemic changes within the cerebral white matter. Chronic appearing left basal ganglia lacunar infarct. Mild-to-moderate generalized cerebral atrophy. Comparatively mild cerebellar atrophy. Paranasal sinus disease at the imaged levels, as described. Electronically Signed   By: Jackey Loge DO   On: 04/12/2021 16:21    ____________________________________________   PROCEDURES   Procedure(s) performed (including Critical Care):  .1-3 Lead EKG Interpretation  Date/Time: 04/13/2021 12:53 AM Performed by: Loleta Rose, MD Authorized by: Loleta Rose, MD     Interpretation: normal     ECG rate:  70   ECG rate assessment: normal     Rhythm: sinus rhythm     Ectopy: none     Conduction: normal   .Critical Care  Date/Time: 04/13/2021 12:53 AM Performed by: Loleta Rose, MD Authorized by: Loleta Rose, MD   Critical care provider statement:    Critical care time (minutes):  40   Critical  care time was exclusive of:  Separately billable procedures and treating other patients   Critical care was necessary to treat or prevent imminent or life-threatening deterioration of the following conditions:  Sepsis and respiratory failure   Critical care was time spent personally by me on the following activities:  Development of treatment plan with patient or surrogate, discussions with consultants, evaluation of patient's response to treatment, examination of patient, obtaining history from patient or surrogate, ordering and performing treatments and interventions, ordering and review of laboratory studies, ordering and review of radiographic studies, pulse oximetry, re-evaluation of patient's condition and review of old charts   ____________________________________________   INITIAL IMPRESSION / MDM / ASSESSMENT AND PLAN / ED COURSE  As part of my medical decision making, I reviewed the following data within the electronic MEDICAL RECORD NUMBER History obtained from family, Nursing notes reviewed and incorporated, Labs reviewed , EKG interpreted , Old chart reviewed, Radiograph reviewed , Discussed with admitting physician , and Notes from prior ED visits   Differential diagnosis includes, but is not limited to, pneumonia, UTI, sepsis, acute intracranial ischemia versus bleed, bacteremia, aspiration.  The patient is on the cardiac monitor to evaluate for evidence of arrhythmia and/or significant heart rate changes.  Patient is altered from baseline, delirium versus encephalopathy.  Upon arrival he was febrile to 102.5 which is substantial particularly for a 87 year old.  He has a history of sick sinus syndrome and was not exhibiting signs of tachycardia although he did have slightly increased respiratory rate upon arrival.  I personally reviewed the patient's imaging and agree with the radiologist's interpretation that there is extensive patchy airspace disease on the right.  Given the history of  aspiration he should be covered for community-acquired pneumonia plus aspiration, but I am reluctant to give him clindamycin.  I will discuss with pharmacy the appropriate option but for now I have ordered ceftriaxone 2 g IV and azithromycin 500 mg IV.  I also ordered 1 L IV fluid bolus but will hold off on additional fluids given that he has not showing signs of septic shock.  However I am calling him sepsis given his fever, mild tachypnea initially, and reported SPO2 of 89% at home and an SPO2 in the ED of 91% although at times when he is breathing deeply will go up to 98%.  2 L of oxygen by nasal cannula for comfort.  I discussed the plan for admission with his daughter who agrees.  Respiratory viral panel is negative.  CBC is generally reassuring.  CMP is also within normal limits with  no signs of significant electrolyte abnormalities nor renal dysfunction.  High-sensitivity troponin is normal.     Clinical Course as of 04/13/21 0203  Thu Apr 13, 2021  0036 Discussed antibiotic options with Barbara Cower the pharmacist.  We discovered the need to cover for community-acquired pathogens plus aspiration.  He felt that Unasyn 3 g IV would be appropriate for the aspiration issue as well as covering most community acquired pathogens, and the addition of azithromycin would help cover atypicals.  I switched the original order from ceftriaxone 2 g IV to Unasyn 3 g IV and To the azithromycin. [CF]  0037 Lactic Acid, Venous: 1.3 [CF]  0113 Discussed case by phone with Dr. Para March with the hospitalist service and she will admit the patient. [CF]    Clinical Course User Index [CF] Loleta Rose, MD     ____________________________________________  FINAL CLINICAL IMPRESSION(S) / ED DIAGNOSES  Final diagnoses:  Sepsis, due to unspecified organism, unspecified whether acute organ dysfunction present [facility])  Acute respiratory failure with hypoxemia (HCC)  Aspiration pneumonia of right lung, unspecified aspiration  pneumonia type, unspecified part of lung (HCC)  Community acquired pneumonia of right lung, unspecified part of lung  Acute encephalopathy  Generalized weakness     MEDICATIONS GIVEN DURING THIS VISIT:  Medications  lactated ringers bolus 1,000 mL (has no administration in time range)  azithromycin (ZITHROMAX) 500 mg in sodium chloride 0.9 % 250 mL IVPB (has no administration in time range)  enoxaparin (LOVENOX) injection 40 mg (has no administration in time range)  0.9 %  sodium chloride infusion (has no administration in time range)  acetaminophen (TYLENOL) tablet 650 mg (has no administration in time range)    Or  acetaminophen (TYLENOL) suppository 650 mg (has no administration in time range)  ondansetron (ZOFRAN) tablet 4 mg (has no administration in time range)    Or  ondansetron (ZOFRAN) injection 4 mg (has no administration in time range)  senna-docusate (Senokot-S) tablet 1 tablet (has no administration in time range)  Ampicillin-Sulbactam (UNASYN) 3 g in sodium chloride 0.9 % 100 mL IVPB (has no administration in time range)  acetaminophen (TYLENOL) tablet 650 mg (650 mg Oral Given 04/12/21 1533)  Ampicillin-Sulbactam (UNASYN) 3 g in sodium chloride 0.9 % 100 mL IVPB (3 g Intravenous New Bag/Given 04/13/21 0056)     ED Discharge Orders     None        Note:  This document was prepared using Dragon voice recognition software and may include unintentional dictation errors.   Loleta Rose, MD 04/13/21 [phone]

## 2021-04-13 NOTE — Progress Notes (Signed)
CODE SEPSIS - PHARMACY COMMUNICATION  **Broad Spectrum Antibiotics should be administered within 1 hour of Sepsis diagnosis**  Time Code Sepsis Called/Page Received: 8/4 @ 0006  Antibiotics Ordered:  Unasyn   Time of 1st antibiotic administration: Unasyn 3 gm IV X 1 on 8/4 0056.   Additional action taken by pharmacy:   If necessary, Name of Provider/Nurse Contacted:     Beverlie Kurihara D ,PharmD Clinical Pharmacist  04/13/2021  1:31 AM

## 2021-04-13 NOTE — Progress Notes (Signed)
Noted  

## 2021-04-13 NOTE — Progress Notes (Signed)
Pharmacy Antibiotic Note  Jeffery Horne is a 87 y.o. male admitted on 04/12/2021 with PNA.  Pharmacy has been consulted for Unasyn dosing.  Plan: Unasyn 3 gm IV X 1 given in ED on 8/4 @ 0056. Unasyn 3 gm IV Q6H ordered to continue on 8/4 @ 0700.   Height: 5\' 5"  (165.1 cm) Weight: 59 kg (130 lb) IBW/kg (Calculated) : 61.5  Temp (24hrs), Avg:100 F (37.8 C), Min:97.8 F (36.6 C), Max:102.5 F (39.2 C)  Recent Labs  Lab 04/12/21 1526  WBC 10.1  CREATININE 0.74  LATICACIDVEN 1.3    Estimated Creatinine Clearance: 49.2 mL/min (by C-G formula based on SCr of 0.74 mg/dL).    No Known Allergies  Antimicrobials this admission:   >>    >>   Dose adjustments this admission:   Microbiology results:  BCx:   UCx:    Sputum:    MRSA PCR:   Thank you for allowing pharmacy to be a part of this patient's care.  Kerianne Gurr D 04/13/2021 1:33 AM

## 2021-04-13 NOTE — H&P (Signed)
History and Physical    Jeffery Horne FFM:[id number] DOB: 1927-06-01 DOA: 04/12/2021  PCP: Marjie Skiff, NP   Patient coming from: ALF  I have personally briefly reviewed patient's old medical records in [facility] Link  Chief Complaint: Weakness  HPI: Jeffery Horne is a 87 y.o. male with medical history significant for BPH, HTN, DM, PVD, resident of an assisted living facility who was sent to the ED for evaluation of generalized weakness and confusion that appeared to have started several hours following a choking episode at the facility.  Over the past several days prior to the choking episode patient, who at baseline can care for himself with little assistance, has been declining and had generalized weakness.  Following the episode, he gradually appeared more confused, more lethargic and they reported O2 levels down to 89% on room air.  History is limited due to lethargy but there have been no prior reports of vomiting or diarrhea, abdominal pain or complaints of chest pain or shortness of breath  ED course: On arrival temperature 102.5 with pulse 77 BP 131/56 and O2 sat 91% on room air. Blood work with normal WBC, hemoglobin 10.8.  CMP unremarkable, urinalysis unremarkable troponin of 13, lactic acid 1.3 COVID and flu negative  EKG, personally viewed and interpreted: NSR at 77 with no acute ST-T wave changes  Imaging: CT head with no acute intracranial abnormality.  Chronic appearing left basal ganglia lacunar infarct Chest x-ray new patchy interstitial airspace opacities in the right lung concerning for aspiration and/or pneumonia  Patient started on sepsis fluids, Unasyn and azithromycin.  Hospitalist consulted for admission.    Review of Systems: As per HPI otherwise all other systems on review of systems negative.    Past Medical History:  Diagnosis Date   BPH (benign prostatic hypertrophy) with urinary obstruction    Hearing impairment    Hyperlipidemia     Hypertension    Peripheral vascular disease (HCC)    Primary gout    Type II diabetes mellitus with neurological manifestations (HCC)     Past Surgical History:  Procedure Laterality Date   HERNIA REPAIR     PACEMAKER INSERTION     Dr. Juel Burrow     reports that he quit smoking about 49 years ago. His smoking use included cigarettes. He has never used smokeless tobacco. He reports that he does not drink alcohol and does not use drugs.  No Known Allergies  Family History  Problem Relation Age of Onset   Diabetes Mother    Hypertension Mother    Stroke Mother    Hypertension Father       Prior to Admission medications   Medication Sig Start Date End Date Taking? Authorizing Provider  aspirin EC 81 MG tablet Take 81 mg by mouth daily.   Yes [provider]  clopidogrel (PLAVIX) 75 MG tablet Take 75 mg by mouth daily. 01/10/18  Yes [provider]  clotrimazole-betamethasone (LOTRISONE) cream Apply 1 application topically 2 (two) times daily. 12/31/20  Yes [provider]  Emollient (EUCERIN INTENSIVE REPAIR) LOTN Apply 1 application topically at bedtime. 11/09/20  Yes [provider]  fexofenadine (ALLEGRA) 180 MG tablet Take 180 mg by mouth daily.   Yes [provider]  gabapentin (NEURONTIN) 300 MG capsule Take 2 capsules (600 mg total) by mouth 2 (two) times daily. 02/10/20  Yes Cannady, Jolene T, NP  glucose blood test strip For testing blood sugar twice per day. 11/05/19  Yes  Cannady, Jolene T, NP  hydrocortisone 1 % ointment Apply 1 application topically at bedtime. 11/09/20  Yes [provider]  metoprolol tartrate (LOPRESSOR) 25 MG tablet TAKE 1 TABLET BY MOUTH EVERY DAY 11/20/20  Yes Cannady, Jolene T, NP  mirtazapine (REMERON) 7.5 MG tablet Take 1 tablet (7.5 mg total) by mouth at bedtime. 08/31/20  Yes Cannady, Corrie Dandy T, NP  Multiple Vitamin (MULTIVITAMIN WITH MINERALS) TABS tablet Take 1 tablet by mouth daily.   Yes [provider]  tamsulosin (FLOMAX) 0.4 MG CAPS capsule TAKE 1 CAPSULE BY MOUTH EVERY DAY 11/20/20  Yes Cannady, Jolene T, NP  vitamin B-12 (CYANOCOBALAMIN) 1000 MCG tablet Take 1,000 mcg by mouth daily.   Yes [provider]    Physical Exam: Vitals:   04/12/21 1521 04/12/21 1523 04/12/21 1939  BP: (!) 131/56  (!) 107/53  Pulse: 77  62  Resp: 20  16  Temp: (!) 102.5 F (39.2 C)  97.8 F (36.6 C)  TempSrc: Oral  Oral  SpO2: 91%  95%  Weight:  59 kg   Height:  5\' 5"  (1.651 m)      Vitals:   04/12/21 1521 04/12/21 1523 04/12/21 1939  BP: (!) 131/56  (!) 107/53  Pulse: 77  62  Resp: 20  16  Temp: (!) 102.5 F (39.2 C)  97.8 F (36.6 C)  TempSrc: Oral  Oral  SpO2: 91%  95%  Weight:  59 kg   Height:  5\' 5"  (1.651 m)       Constitutional: Alert and oriented x 3 . Not in any apparent distress HEENT:      Head: Normocephalic and atraumatic.         Eyes: PERLA, EOMI, Conjunctivae are normal. Sclera is non-icteric.       Mouth/Throat: Mucous membranes are moist.       Neck: Supple with no signs of meningismus. Cardiovascular: Regular rate and rhythm. No murmurs, gallops, or rubs. 2+ symmetrical distal pulses are present . No JVD. No LE edema Respiratory: Respiratory effort normal .Lungs sounds clear bilaterally. No wheezes, crackles, or rhonchi.  Gastrointestinal: Soft, non tender, and non distended with positive bowel sounds.  Genitourinary: No CVA tenderness. Musculoskeletal: Nontender with normal range of motion in all extremities. No cyanosis, or erythema of extremities. Neurologic:  Face is symmetric. Moving all extremities. No gross focal neurologic deficits . Skin: Skin is warm, dry.  No rash or ulcers Psychiatric: Mood and affect are normal    Labs on Admission: I have personally reviewed following labs and imaging studies  CBC: Recent Labs  Lab 04/12/21 1526  WBC 10.1  NEUTROABS 8.4*  HGB 10.8*  HCT 33.0*  MCV 91.9  PLT 208   Basic Metabolic  Panel: Recent Labs  Lab 04/12/21 1526  NA 137  K 3.7  CL 100  CO2 28  GLUCOSE 177*  BUN 23  CREATININE 0.74  CALCIUM 8.7*   GFR: Estimated Creatinine Clearance: 49.2 mL/min (by C-G formula based on SCr of 0.74 mg/dL). Liver Function Tests: Recent Labs  Lab 04/12/21 1526  AST 23  ALT 13  ALKPHOS 61  BILITOT 1.7*  PROT 6.9  ALBUMIN 2.9*   No results for input(s): LIPASE, AMYLASE in the last 168 hours. No results for input(s): AMMONIA in the last 168 hours. Coagulation Profile: Recent Labs  Lab 04/12/21 1526  INR 1.4*   Cardiac Enzymes: No results for input(s): CKTOTAL, CKMB, CKMBINDEX, TROPONINI in the last 168 hours. BNP (last 3  results) No results for input(s): PROBNP in the last 8760 hours. HbA1C: Recent Labs    04/12/21 1149  HGBA1C 5.4   CBG: No results for input(s): GLUCAP in the last 168 hours. Lipid Profile: No results for input(s): CHOL, HDL, LDLCALC, TRIG, CHOLHDL, LDLDIRECT in the last 72 hours. Thyroid Function Tests: No results for input(s): TSH, T4TOTAL, FREET4, T3FREE, THYROIDAB in the last 72 hours. Anemia Panel: No results for input(s): VITAMINB12, FOLATE, FERRITIN, TIBC, IRON, RETICCTPCT in the last 72 hours. Urine analysis:    Component Value Date/Time   COLORURINE AMBER (A) 04/13/2021 0058   APPEARANCEUR HAZY (A) 04/13/2021 0058   APPEARANCEUR Clear 04/12/2021 1149   LABSPEC 1.026 04/13/2021 0058   PHURINE 5.0 04/13/2021 0058   GLUCOSEU NEGATIVE 04/13/2021 0058   HGBUR NEGATIVE 04/13/2021 0058   BILIRUBINUR NEGATIVE 04/13/2021 0058   BILIRUBINUR Negative 04/12/2021 1149   KETONESUR NEGATIVE 04/13/2021 0058   PROTEINUR 30 (A) 04/13/2021 0058   NITRITE NEGATIVE 04/13/2021 0058   LEUKOCYTESUR NEGATIVE 04/13/2021 0058    Radiological Exams on Admission: DG Chest 2 View  Result Date: 04/12/2021 CLINICAL DATA:  questionable aspiration EXAM: CHEST - 2 VIEW COMPARISON:  None. FINDINGS: Patient positioning/rotation limits evaluation.  New patchy interstitial airspace opacities in the right lung. Left lung is clear. No visible pleural effusions or pneumothorax. Cardiomediastinal silhouette is likely within normal limits when accounting for patient rotation. Left subclavian approach dual lead cardiac rhythm maintenance device. Exaggerated thoracic kyphosis with multilevel thoracic degenerative change and osteopenia. IMPRESSION: New patchy interstitial airspace opacities in the right lung, concerning for aspiration and/or pneumonia. Electronically Signed   By: Feliberto HartsFrederick S Jones MD   On: 04/12/2021 17:04   CT HEAD WO CONTRAST (5MM)  Result Date: 04/12/2021 CLINICAL DATA:  Mental status change, unknown cause. Additional history provided: Patient's daughter repeats patient experiencing progressive weakness for the past 2 weeks, patient having "difficulty getting words out." EXAM: CT HEAD WITHOUT CONTRAST TECHNIQUE: Contiguous axial images were obtained from the base of the skull through the vertex without intravenous contrast. COMPARISON:  Head CT 01/11/2011. FINDINGS: Brain: Mild-to-moderate generalized cerebral atrophy. Comparatively mild cerebellar atrophy. Mild patchy and ill-defined hypoattenuation within the cerebral white matter, nonspecific but compatible with chronic small vessel ischemic disease. Chronic appearing lacunar infarct within the left caudate nucleus. There is no acute intracranial hemorrhage. No demarcated cortical infarct. No extra-axial fluid collection. No evidence of an intracranial mass. No midline shift. Vascular: No hyperdense vessel.  Atherosclerotic calcifications. Skull: Normal. Negative for fracture or focal lesion. Sinuses/Orbits: Visualized orbits show no acute finding. Postsurgical appearance of the paranasal sinuses. Mild mucosal thickening within the bilateral ethmoid air cells/ethmoidectomy cavities. Small right maxillary sinus fluid level. IMPRESSION: No evidence of acute intracranial abnormality. Mild chronic  small vessel ischemic changes within the cerebral white matter. Chronic appearing left basal ganglia lacunar infarct. Mild-to-moderate generalized cerebral atrophy. Comparatively mild cerebellar atrophy. Paranasal sinus disease at the imaged levels, as described. Electronically Signed   By: Jackey LogeKyle  Golden DO   On: 04/12/2021 16:21     Assessment/Plan 87 year old male with history of HTN, DM, PVD, resident of an assisted living facility who was sent to the ED for evaluation of generalized weakness and confusion that appeared to have started several hours following a choking episode at the facility.      Aspiration pneumonia (HCC)   Sepsis, possible  Generalized weakness - Sepsis criteria includes fever,pneumonia, AMS, mild hypoxia.  WBC and lactic acid WNL, - Continue IV fluids - Continue Unasyn -  Follow cultures - N.p.o., aspiration precautions, swallow study    Hypertension  - Continue metoprolol    Type II diabetes mellitus with neurological manifestations - Sliding scale insulin  Peripheral vascular disease - Continue aspirin, clopidogrel    DVT prophylaxis: Lovenox  Code Status: DNR Family Communication: Daughter, Liz Beach.  Discussed plan of care and she verbalized understanding and is in agreement.  Discussed CODE STATUS.  Her father is DNR.   Disposition Plan: Back to previous home environment Consults called: none  Status:At the time of admission, it appears that the appropriate admission status for this patient is INPATIENT. This is judged to be reasonable and necessary in order to provide the required intensity of service to ensure the patient's safety given the presenting symptoms, physical exam findings, and initial radiographic and laboratory data in the context of their  Comorbid conditions.   Patient requires inpatient status due to high intensity of service, high risk for further deterioration and high frequency of surveillance required.   I certify that at the  point of admission it is my clinical judgment that the patient will require inpatient hospital care spanning beyond 2 midnights     Andris Baumann MD [facility]     04/13/2021, 1:56 AM

## 2021-04-13 NOTE — Evaluation (Signed)
Occupational Therapy Evaluation Patient Details Name: Jeffery Horne MRN: [id number] DOB: May 10, 1927 Today's Date: 04/13/2021    History of Present Illness As per MD: Jeffery Horne is a 87 y.o. male with medical history significant for BPH, HTN, DM, PVD, resident of an assisted living facility who was sent to the ED for evaluation of generalized weakness and confusion that appeared to have started several hours following a choking episode at the facility.  Over the past several days prior to the choking episode patient, who at baseline can care for himself with little assistance, has been declining and had generalized weakness.  Following the episode, he gradually appeared more confused, more lethargic and they reported O2 levels down to 89% on room air.  History is limited due to lethargy but there have been no prior reports of vomiting or diarrhea, abdominal pain or complaints of chest pain or shortness of breath. Pt, No longer on O2 via [place] and is able ot maintain SPO2>93% at all time.   Clinical Impression   Jeffery Horne presents today with generalized weakness, limited endurance, mild confusion, and poor standing balance. He is aware he is at a hospital, but is unsure why, and persists in believing he is at [facility]. He continues to say that he cannot stand with a RW, even after he has done so. He reports having 3 or 4 falls in past 12 months. Jeffery Horne requires Mod A for bed mobility and transfers, using RW, as well as frequent VCs and encouragement to continue participation. Recommend ongoing OT while pt is hospitalized, with HHOT post DC.    Follow Up Recommendations  Home health OT    Equipment Recommendations  None recommended by OT    Recommendations for Other Services       Precautions / Restrictions Precautions Precautions: Fall Restrictions Weight Bearing Restrictions: No      Mobility Bed Mobility Overal bed mobility: Needs Assistance Bed Mobility: Supine to Sit      Supine to sit: Max assist          Transfers Overall transfer level: Needs assistance Equipment used: Rolling walker (2 wheeled) Transfers: Sit to/from [employer] Sit to Stand: Mod assist Stand pivot transfers: Mod assist            Balance Overall balance assessment: Needs assistance Sitting-balance support: Bilateral upper extremity supported Sitting balance-Leahy Scale: Fair   Postural control: Right lateral lean Standing balance support: Bilateral upper extremity supported Standing balance-Leahy Scale: Poor                             ADL either performed or assessed with clinical judgement   ADL Overall ADL's : Needs assistance/impaired                     Lower Body Dressing: Moderate assistance Lower Body Dressing Details (indicate cue type and reason): donning/doffing shoes, socks                     Vision Baseline Vision/History: Wears glasses Patient Visual Report: No change from baseline       Perception     Praxis      Pertinent Vitals/Pain Pain Assessment: No/denies pain     Hand Dominance     Extremity/Trunk Assessment Upper Extremity Assessment Upper Extremity Assessment: Generalized weakness   Lower Extremity Assessment Lower Extremity Assessment: Generalized weakness  Communication Communication Communication: HOH   Cognition Arousal/Alertness: Awake/alert Behavior During Therapy: Anxious Overall Cognitive Status: No family/caregiver present to determine baseline cognitive functioning                                 General Comments: Able to state year, season. Thought he was at [facility].   General Comments       Exercises Other Exercises Other Exercises: Transfers, mobility, dressing, cognitive reorientation. Educ re: importance of OOB activity, role of OT, DC recs.   Shoulder Instructions      Home Living Family/patient expects to be discharged to::  Assisted living                             Home Equipment: Walker - 2 wheels          Prior Functioning/Environment Level of Independence: Needs assistance  Gait / Transfers Assistance Needed: Uses primarily WC for ambulation, 4WW for transfers ADL's / Homemaking Assistance Needed: Pt reports his ALF provides meals, cleaning, assistance with bathing. Communication / Swallowing Assistance Needed: No          OT Problem List: Decreased strength;Impaired balance (sitting and/or standing);Decreased cognition;Decreased activity tolerance;Decreased coordination      OT Treatment/Interventions: Self-care/ADL training;Therapeutic activities;Balance training;Therapeutic exercise;Patient/family education    OT Goals(Current goals can be found in the care plan section) Acute Rehab OT Goals Patient Stated Goal: To "get out of here" OT Goal Formulation: With patient Time For Goal Achievement: 04/27/21 Potential to Achieve Goals: Good ADL Goals Pt Will Perform Grooming: with modified independence;with set-up;sitting Pt Will Perform Lower Body Dressing: with supervision;with set-up;sitting/lateral leans Pt Will Transfer to Toilet: with supervision (using LRAD) Pt Will Perform Toileting - Clothing Manipulation and hygiene: with supervision;sitting/lateral leans  OT Frequency: Min 1X/week   Barriers to D/C:            Co-evaluation     PT goals addressed during session: Mobility/safety with mobility;Proper use of DME;Strengthening/ROM;Balance (MMT, Sensation,)        AM-PAC OT "6 Clicks" Daily Activity     Outcome Measure Help from another person eating meals?: A Little Help from another person taking care of personal grooming?: A Little Help from another person toileting, which includes using toliet, bedpan, or urinal?: A Lot Help from another person bathing (including washing, rinsing, drying)?: A Lot Help from another person to put on and taking off regular upper  body clothing?: A Little Help from another person to put on and taking off regular lower body clothing?: A Little 6 Click Score: 16   End of Session Equipment Utilized During Treatment: Rolling walker  Activity Tolerance: Patient tolerated treatment well;Patient limited by lethargy Patient left: in chair;with call bell/phone within reach;with chair alarm set;Other (comment) (case manager in room)  OT Visit Diagnosis: Unsteadiness on feet (R26.81);Muscle weakness (generalized) (M62.81);History of falling (Z91.81)                Time: 7517-0017 OT Time Calculation (min): 16 min Charges:  OT General Charges $OT Visit: 1 Visit OT Evaluation $OT Eval Moderate Complexity: 1 Mod OT Treatments $Self Care/Home Management : 8-22 mins Latina Craver, PhD, MS, OTR/L 04/13/21, 1:28 PM

## 2021-04-13 NOTE — Progress Notes (Signed)
Sepsis tracking by eLINK 

## 2021-04-13 NOTE — Progress Notes (Signed)
PROGRESS NOTE    Jeffery Horne  ZOX:[id number] DOB: 06/14/1927 DOA: 04/12/2021 PCP: Marjie Skiff, NP      Brief Narrative:  Jeffery Horne is a 87 y.o. M with DM, vascular disease, and HTN who presented with few days weakness and progressive decrease in functional level, then a severe choking episode, confusion and hypoxia and cough.  At baseline the patient is able to transfer, do self cares, but over the last several days he has gotten weaker and weaker until he is unable to get up without assistance.  Then today he developed a fever, had a prolonged severe choking episode, and appeared confused so 911 was called.  In the ER, CXR showed pneumonia.      Assessment & Plan:  Aspiration pneumonia Possible sepsis Patient presented with fever, tachypnea, confusion, and hypoxia in setting of pneumonia on chest x-ray. - Continue ceftriaxone - Respiratory care therapy   Respiratory failure ruled out  Acute metabolic encephalopathy She is normally oriented to person place and time, but at the presentation he was oriented to self only, confused about any other details.  This appears improved this morning and he is more oriented.   Hypertension Vascular disease, secondary prevention - Continue aspirin and Plavix - Continue metoprolol  Diabetes, type II with polyneuropathy A1c controlled off of medications, glucose normal -Continue gabapentin  BPH - Continue Flomax  Dementia - Continue           Disposition: Status is: Inpatient  Remains inpatient appropriate because:Inpatient level of care appropriate due to severity of illness PSI 122, risk class IV  Dispo: The patient is from: SNF              Anticipated d/c is to: SNF              Patient currently is not medically stable to d/c.   Difficult to place patient No              MDM: This is a no charge note.  For further details, please see H&P by my partner Dr. Para March from earlier today.   The below labs and imaging reports were reviewed and summarized above.    DVT prophylaxis: enoxaparin (LOVENOX) injection 40 mg Start: 04/13/21 0800  Code Status: DNR Family Communication: Daughter by phone          Subjective: Coughing up mucus.  Fever this afternoon.        Objective: Vitals:   04/13/21 1100 04/13/21 1120 04/13/21 1555 04/13/21 1722  BP:  120/62 (!) 129/55   Pulse: 65 62 64   Resp: 13 20 20    Temp:  99.3 F (37.4 C) (!) 101 F (38.3 C) 99.3 F (37.4 C)  TempSrc:  Oral Oral Oral  SpO2: 97% 92% 92%   Weight:      Height:        Intake/Output Summary (Last 24 hours) at 04/13/2021 1848 Last data filed at 04/13/2021 1843 Gross per 24 hour  Intake 1700.82 ml  Output 100 ml  Net 1600.82 ml   Filed Weights   04/12/21 1523 04/13/21 0306  Weight: 59 kg 59 kg    Examination: The patient was seen and examined.      Data Reviewed: I have personally reviewed following labs and imaging studies:  CBC: Recent Labs  Lab 04/12/21 1154 04/12/21 1526 04/13/21 0417  WBC 9.3 10.1 10.8*  NEUTROABS 7.4* 8.4*  --   HGB 11.4* 10.8* 10.2*  HCT 37.0* 33.0*  31.5*  MCV 92 91.9 93.5  PLT 217 208 197   Basic Metabolic Panel: Recent Labs  Lab 04/12/21 1154 04/12/21 1526 04/13/21 0417  NA 140 137  --   K 3.8 3.7  --   CL 98 100  --   CO2 27 28  --   GLUCOSE 152* 177*  --   BUN 20 23  --   CREATININE 0.79 0.74 0.81  CALCIUM 8.6 8.7*  --    GFR: Estimated Creatinine Clearance: 48.6 mL/min (by C-G formula based on SCr of 0.81 mg/dL). Liver Function Tests: Recent Labs  Lab 04/12/21 1154 04/12/21 1526  AST 22 23  ALT 11 13  ALKPHOS 80 61  BILITOT 1.3* 1.7*  PROT 6.5 6.9  ALBUMIN 3.3* 2.9*   No results for input(s): LIPASE, AMYLASE in the last 168 hours. No results for input(s): AMMONIA in the last 168 hours. Coagulation Profile: Recent Labs  Lab 04/12/21 1526 04/13/21 0417  INR 1.4* 1.5*   Cardiac Enzymes: No results for input(s):  CKTOTAL, CKMB, CKMBINDEX, TROPONINI in the last 168 hours. BNP (last 3 results) No results for input(s): PROBNP in the last 8760 hours. HbA1C: Recent Labs    04/12/21 1149 04/13/21 0417  HGBA1C 5.4 5.7*   CBG: No results for input(s): GLUCAP in the last 168 hours. Lipid Profile: Recent Labs    04/12/21 1154  CHOL 141  HDL 53  LDLCALC 76  TRIG 55   Thyroid Function Tests: No results for input(s): TSH, T4TOTAL, FREET4, T3FREE, THYROIDAB in the last 72 hours. Anemia Panel: Recent Labs    04/12/21 1154  VITAMINB12 424   Urine analysis:    Component Value Date/Time   COLORURINE AMBER (A) 04/13/2021 0058   APPEARANCEUR HAZY (A) 04/13/2021 0058   APPEARANCEUR Clear 04/12/2021 1149   LABSPEC 1.026 04/13/2021 0058   PHURINE 5.0 04/13/2021 0058   GLUCOSEU NEGATIVE 04/13/2021 0058   HGBUR NEGATIVE 04/13/2021 0058   BILIRUBINUR NEGATIVE 04/13/2021 0058   BILIRUBINUR Negative 04/12/2021 1149   KETONESUR NEGATIVE 04/13/2021 0058   PROTEINUR 30 (A) 04/13/2021 0058   NITRITE NEGATIVE 04/13/2021 0058   LEUKOCYTESUR NEGATIVE 04/13/2021 0058   Sepsis Labs: @LABRCNTIP (procalcitonin:4,lacticacidven:4)  ) Recent Results (from the past 240 hour(s))  Microscopic Examination     Status: Abnormal   Collection Time: 04/12/21 11:49 AM   BLD  Result Value Ref Range Status   WBC, UA None seen 0 - 5 /hpf Final   RBC 0-2 0 - 2 /hpf Final   Epithelial Cells (non renal) 0-10 0 - 10 /hpf Final   Mucus, UA Present (A) Not Estab. Final   Bacteria, UA None seen None seen/Few Final  Resp Panel by RT-PCR (Flu A&B, Covid) Nasopharyngeal Swab     Status: None   Collection Time: 04/12/21  3:26 PM   Specimen: Nasopharyngeal Swab; Nasopharyngeal(NP) swabs in vial transport medium  Result Value Ref Range Status   SARS Coronavirus 2 by RT PCR NEGATIVE NEGATIVE Final    Comment: (NOTE) SARS-CoV-2 target nucleic acids are NOT DETECTED.  The SARS-CoV-2 RNA is generally detectable in upper  respiratory specimens during the acute phase of infection. The lowest concentration of SARS-CoV-2 viral copies this assay can detect is 138 copies/mL. A negative result does not preclude SARS-Cov-2 infection and should not be used as the sole basis for treatment or other patient management decisions. A negative result may occur with  improper specimen collection/handling, submission of specimen other than nasopharyngeal swab, presence of viral  mutation(s) within the areas targeted by this assay, and inadequate number of viral copies(<138 copies/mL). A negative result must be combined with clinical observations, patient history, and epidemiological information. The expected result is Negative.  Fact Sheet for Patients:  BloggerCourse.comhttps://www.fda.gov/media/152166/download  Fact Sheet for Healthcare Providers:  SeriousBroker.ithttps://www.fda.gov/media/152162/download  This test is no t yet approved or cleared by the [place] FDA and  has been authorized for detection and/or diagnosis of SARS-CoV-2 by FDA under an Emergency Use Authorization (EUA). This EUA will remain  in effect (meaning this test can be used) for the duration of the COVID-19 declaration under Section 564(b)(1) of the Act, 21 U.S.C.section 360bbb-3(b)(1), unless the authorization is terminated  or revoked sooner.       Influenza A by PCR NEGATIVE NEGATIVE Final   Influenza B by PCR NEGATIVE NEGATIVE Final    Comment: (NOTE) The Xpert Xpress SARS-CoV-2/FLU/RSV plus assay is intended as an aid in the diagnosis of influenza from Nasopharyngeal swab specimens and should not be used as a sole basis for treatment. Nasal washings and aspirates are unacceptable for Xpert Xpress SARS-CoV-2/FLU/RSV testing.  Fact Sheet for Patients: BloggerCourse.comhttps://www.fda.gov/media/152166/download  Fact Sheet for Healthcare Providers: SeriousBroker.ithttps://www.fda.gov/media/152162/download  This test is not yet approved or cleared by the [place] FDA and has been  authorized for detection and/or diagnosis of SARS-CoV-2 by FDA under an Emergency Use Authorization (EUA). This EUA will remain in effect (meaning this test can be used) for the duration of the COVID-19 declaration under Section 564(b)(1) of the Act, 21 U.S.C. section 360bbb-3(b)(1), unless the authorization is terminated or revoked.  Performed at [facility], [address] [place], [place] [address]   Blood culture (single)     Status: None (Preliminary result)   Collection Time: 04/12/21  3:26 PM   Specimen: BLOOD  Result Value Ref Range Status   Specimen Description BLOOD BLOOD RIGHT FOREARM  Final   Special Requests   Final    BOTTLES DRAWN AEROBIC AND ANAEROBIC Blood Culture adequate volume   Culture   Final    NO GROWTH < 24 HOURS Performed at [facility], [address] [place], [place] [address]    Report Status PENDING  Incomplete  Culture, blood (single)     Status: None (Preliminary result)   Collection Time: 04/13/21 12:06 AM   Specimen: BLOOD RIGHT FOREARM  Result Value Ref Range Status   Specimen Description BLOOD RIGHT FOREARM  Final   Special Requests   Final    BOTTLES DRAWN AEROBIC AND ANAEROBIC Blood Culture adequate volume   Culture   Final    NO GROWTH < 12 HOURS Performed at [facility], [address] [place], [place] [address]    Report Status PENDING  Incomplete         Radiology Studies: DG Chest 2 View  Result Date: 04/12/2021 CLINICAL DATA:  questionable aspiration EXAM: CHEST - 2 VIEW COMPARISON:  None. FINDINGS: Patient positioning/rotation limits evaluation. New patchy interstitial airspace opacities in the right lung. Left lung is clear. No visible pleural effusions or pneumothorax. Cardiomediastinal silhouette is likely within normal limits when accounting for patient rotation. Left subclavian approach dual lead cardiac rhythm maintenance device. Exaggerated thoracic kyphosis with multilevel thoracic  degenerative change and osteopenia. IMPRESSION: New patchy interstitial airspace opacities in the right lung, concerning for aspiration and/or pneumonia. Electronically Signed   By: Feliberto HartsFrederick S Jones MD   On: 04/12/2021 17:04   CT HEAD WO CONTRAST (5MM)  Result Date: 04/12/2021 CLINICAL  DATA:  Mental status change, unknown cause. Additional history provided: Patient's daughter repeats patient experiencing progressive weakness for the past 2 weeks, patient having "difficulty getting words out." EXAM: CT HEAD WITHOUT CONTRAST TECHNIQUE: Contiguous axial images were obtained from the base of the skull through the vertex without intravenous contrast. COMPARISON:  Head CT 01/11/2011. FINDINGS: Brain: Mild-to-moderate generalized cerebral atrophy. Comparatively mild cerebellar atrophy. Mild patchy and ill-defined hypoattenuation within the cerebral white matter, nonspecific but compatible with chronic small vessel ischemic disease. Chronic appearing lacunar infarct within the left caudate nucleus. There is no acute intracranial hemorrhage. No demarcated cortical infarct. No extra-axial fluid collection. No evidence of an intracranial mass. No midline shift. Vascular: No hyperdense vessel.  Atherosclerotic calcifications. Skull: Normal. Negative for fracture or focal lesion. Sinuses/Orbits: Visualized orbits show no acute finding. Postsurgical appearance of the paranasal sinuses. Mild mucosal thickening within the bilateral ethmoid air cells/ethmoidectomy cavities. Small right maxillary sinus fluid level. IMPRESSION: No evidence of acute intracranial abnormality. Mild chronic small vessel ischemic changes within the cerebral white matter. Chronic appearing left basal ganglia lacunar infarct. Mild-to-moderate generalized cerebral atrophy. Comparatively mild cerebellar atrophy. Paranasal sinus disease at the imaged levels, as described. Electronically Signed   By: Jackey Loge DO   On: 04/12/2021 16:21         Scheduled Meds:  enoxaparin (LOVENOX) injection  40 mg Subcutaneous Q24H   Continuous Infusions:  azithromycin Stopped (04/13/21 0329)   cefTRIAXone (ROCEPHIN)  IV 2 g (04/13/21 1237)     LOS: 0 days    Time spent: 15 minutes    Alberteen Sam, MD [facility] 04/13/2021, 6:48 PM     Please page though AMION or Epic secure chat:  For password, contact charge nurse

## 2021-04-13 NOTE — TOC Progression Note (Signed)
Transition of Care [facility] - Progression Note    Patient Details  Name: Jeffery Horne MRN: [id number] Date of Birth: 1926/10/31  Transition of [facility]) CM/SW Contact  Caryn Section, RN Phone Number: 04/13/2021, 2:58 PM  Clinical Narrative:   Patient is a resident of [facility] [address] [place], [place], [place] [address]  Phone: [phone] as per the facility, he is able to return on discharge.  TOC will notify facility about discharge.  RNCM spoke with patient's daughter, [name] [phone] who is amenable to patient returning to [facility] as well.  She will be in to see patient tomorrow, and transport to facility if necessary.         Expected Discharge Plan and Services                                                 Social Determinants of Health (SDOH) Interventions    Readmission Risk Interventions No flowsheet data found.

## 2021-04-14 DIAGNOSIS — J690 Pneumonitis due to inhalation of food and vomit: Secondary | ICD-10-CM | POA: Diagnosis not present

## 2021-04-14 NOTE — Progress Notes (Signed)
Physical Therapy Treatment Patient Details Name: Jeffery Horne MRN: [id number] DOB: Jan 04, 1927 Today's Date: 04/14/2021    History of Present Illness As per MD: Jeffery Horne is a 87 y.o. male with medical history significant for BPH, HTN, DM, PVD, resident of an assisted living facility who was sent to the ED for evaluation of generalized weakness and confusion that appeared to have started several hours following a choking episode at the facility.  Over the past several days prior to the choking episode patient, who at baseline can care for himself with little assistance, has been declining and had generalized weakness.  Following the episode, he gradually appeared more confused, more lethargic and they reported O2 levels down to 89% on room air.  History is limited due to lethargy but there have been no prior reports of vomiting or diarrhea, abdominal pain or complaints of chest pain or shortness of breath. Pt, No longer on O2 via [place] and is able ot maintain SPO2>93% at all time.    PT Comments    Pt seen for PT tx with daughter present reporting she brought pt new shoes but they do not fit properly. Pt is able to complete sit<>stand with min assist & ambulate 30 ft within room with RW & min assist with kyphotic, trunk flexed posture and decreased gait speed. Pt is agreeable to chair level exercises & performs them with instructional cuing. Pt eager to return to chair 2/2 feeling cold. Will continue to follow pt acutely to address gait with LRAD, endurance, balance, and strengthening.     Follow Up Recommendations  Home health PT;Supervision/Assistance - 24 hour     Equipment Recommendations  Rolling walker with 5" wheels    Recommendations for Other Services       Precautions / Restrictions Precautions Precautions: Fall Restrictions Weight Bearing Restrictions: No    Mobility  Bed Mobility               General bed mobility comments: not observed, pt received & left  sitting in recliner    Transfers Overall transfer level: Needs assistance Equipment used: Rolling walker (2 wheeled) Transfers: Sit to/from Stand Sit to Stand: Min assist            Ambulation/Gait Ambulation/Gait assistance: Min Chemical engineer (Feet): 30 Feet Assistive device: Rolling walker (2 wheeled) Gait Pattern/deviations: Trunk flexed;Decreased step length - right;Decreased step length - left;Decreased stride length Gait velocity: decreased       Stairs             Wheelchair Mobility    Modified Rankin (Stroke Patients Only)       Balance Overall balance assessment: Needs assistance         Standing balance support: Bilateral upper extremity supported Standing balance-Leahy Scale: Poor                              Cognition Arousal/Alertness: Awake/alert Behavior During Therapy: Flat affect Overall Cognitive Status: Within Functional Limits for tasks assessed                                 General Comments: follows 1 step commands consistently      Exercises General Exercises - Lower Extremity Long Arc Quad: AROM;Strengthening;Both;15 reps;Seated Hip Flexion/Marching: AROM;Strengthening;Both;15 reps;Seated    General Comments        Pertinent Vitals/Pain Pain Assessment:  No/denies pain    Home Living                      Prior Function            PT Goals (current goals can now be found in the care plan section) Acute Rehab PT Goals Patient Stated Goal: To "get out of here" PT Goal Formulation: With patient Potential to Achieve Goals: Good Progress towards PT goals: Progressing toward goals    Frequency    Min 2X/week      PT Plan      Co-evaluation              AM-PAC PT "6 Clicks" Mobility   Outcome Measure  Help needed turning from your back to your side while in a flat bed without using bedrails?: A Little Help needed moving from lying on your back to sitting  on the side of a flat bed without using bedrails?: A Lot Help needed moving to and from a bed to a chair (including a wheelchair)?: A Little Help needed standing up from a chair using your arms (e.g., wheelchair or bedside chair)?: A Little Help needed to walk in hospital room?: A Little Help needed climbing 3-5 steps with a railing? : Total 6 Click Score: 15    End of Session Equipment Utilized During Treatment: Gait belt Activity Tolerance: Patient tolerated treatment well Patient left: in chair;with call bell/phone within reach;with chair alarm set;with family/visitor present Nurse Communication: Mobility status PT Visit Diagnosis: Unsteadiness on feet (R26.81);Muscle weakness (generalized) (M62.81);Difficulty in walking, not elsewhere classified (R26.2)     Time: 3220-2542 PT Time Calculation (min) (ACUTE ONLY): 12 min  Charges:  $Therapeutic Activity: 8-22 mins                     Aleda Grana, PT, DPT 04/14/21, 3:37 PM    Sandi Mariscal 04/14/2021, 3:36 PM

## 2021-04-14 NOTE — Progress Notes (Signed)
PROGRESS NOTE    Jeffery Horne  KYH:[id number] DOB: 1927-07-22 DOA: 04/12/2021 PCP: Marjie Skiff, NP      Brief Narrative:  Jeffery Horne is a 87 y.o. M with DM, vascular disease, and HTN who presented with few days weakness and progressive decrease in functional level, then a severe choking episode, confusion and hypoxia and cough.  At baseline the patient is able to transfer, do self cares, but over the last several days he has gotten weaker and weaker until he is unable to get up without assistance.  Then today he developed a fever, had a prolonged severe choking episode, and appeared confused so 911 was called.  In the ER, CXR showed pneumonia.         Assessment & Plan:  Aspiration pneumonia Possible sepsis Patient presented with fever, tachypnea, confusion, and hypoxia in setting of pneumonia on chest x-ray.  Fever to 101F last night, still too weak to stand alone. - Continue Rocephin - Aggressive pulmonary   Respiratory failure ruled out  Acute metabolic encephalopathy He  is normally oriented to person place and time, but at the presentation he was oriented to self only, confused about any other details.  He is oriented to person, time, place and situation   Hypertension Vascular disease, secondary prevention Blood pressure normal - Continue aspirin and Plavix - Continue metoprolol  Diabetes, type II with polyneuropathy A1c controlled off of medications, glucose normal here -Hold SS corrections -Continue gabapentin   BPH - Continue  Dementia - Continue mirtazapine          Disposition: Status is: Inpatient  Remains inpatient appropriate because: Given the patients age, debility, poor respiratory reserve, continued fever and PSI 122 at admission, I believe he requires ongoing hosptal level care and that discharge at this time would risk unnecessary deterioration or rehospitalization.  Dispo: The patient is from: SNF               Anticipated d/c is to: SNF              Patient currently is not medically stable to d/c.   Difficult to place patient No              MDM: The below labs and imaging reports reviewed and summarized above.  Medication management as above.   DVT prophylaxis: enoxaparin (LOVENOX) injection 40 mg Start: 04/13/21 0800  Code Status: DNR Family Communication: Daughter by            Subjective: Fever last night.  No fever this morning.  Still with a lot of mucus.  No chest pain.  No confusion.  No vomiting.  No respiratory distress.        Objective: Vitals:   04/14/21 0539 04/14/21 0743 04/14/21 0945 04/14/21 1111  BP: (!) 111/37 (!) 107/46 (!) 112/41 115/87  Pulse: 60 63 61 62  Resp: 18 20  20   Temp: 99.3 F (37.4 C) 98.2 F (36.8 C)  98.3 F (36.8 C)  TempSrc:  Oral  Oral  SpO2: 94% 94%  91%  Weight:      Height:        Intake/Output Summary (Last 24 hours) at 04/14/2021 1413 Last data filed at 04/14/2021 1012 Gross per 24 hour  Intake 958.5 ml  Output 800 ml  Net 158.5 ml   Filed Weights   04/12/21 1523 04/13/21 0306  Weight: 59 kg 59 kg    Examination: General appearance: Thin elderly adult, sitting in bed,  no obvious distress, appears tired     HEENT: Dentures in place, oropharynx moist, no oral lesions, hearing diminished, anicteric, conjunctive pink, lids and lashes normal. Skin: Some senile ecchymoses, otherwise no suspicious rashes or lesions on the face, neck, chest, abdomen, back, arms, or legs Cardiac: Regular rate and rhythm, no murmurs, no lower extremity edema no JVD Respiratory: Normal respiratory rate and rhythm, lungs with crackles in the right base, no wheezing Abdomen: Abdomen soft without tenderness palpation or guarding, no ascites or distention MSK:  Neuro: Awake and alert, extraocular movements intact, moves all extremities with severe generalized weakness, strength 5/5 in upper extremities bilaterally, but legs are very weak,  shuffling gait, speech fluent Psych: Attention distracted, affect blunted, judgment insight appear mildly impaired although he is oriented to person, place, time, and situation     Data Reviewed: I have personally reviewed following labs and imaging studies:  CBC: Recent Labs  Lab 04/12/21 1154 04/12/21 1526 04/13/21 0417  WBC 9.3 10.1 10.8*  NEUTROABS 7.4* 8.4*  --   HGB 11.4* 10.8* 10.2*  HCT 37.0* 33.0* 31.5*  MCV 92 91.9 93.5  PLT 217 208 197   Basic Metabolic Panel: Recent Labs  Lab 04/12/21 1154 04/12/21 1526 04/13/21 0417  NA 140 137  --   K 3.8 3.7  --   CL 98 100  --   CO2 27 28  --   GLUCOSE 152* 177*  --   BUN 20 23  --   CREATININE 0.79 0.74 0.81  CALCIUM 8.6 8.7*  --    GFR: Estimated Creatinine Clearance: 48.6 mL/min (by C-G formula based on SCr of 0.81 mg/dL). Liver Function Tests: Recent Labs  Lab 04/12/21 1154 04/12/21 1526  AST 22 23  ALT 11 13  ALKPHOS 80 61  BILITOT 1.3* 1.7*  PROT 6.5 6.9  ALBUMIN 3.3* 2.9*   No results for input(s): LIPASE, AMYLASE in the last 168 hours. No results for input(s): AMMONIA in the last 168 hours. Coagulation Profile: Recent Labs  Lab 04/12/21 1526 04/13/21 0417  INR 1.4* 1.5*   Cardiac Enzymes: No results for input(s): CKTOTAL, CKMB, CKMBINDEX, TROPONINI in the last 168 hours. BNP (last 3 results) No results for input(s): PROBNP in the last 8760 hours. HbA1C: Recent Labs    04/12/21 1149 04/13/21 0417  HGBA1C 5.4 5.7*   CBG: No results for input(s): GLUCAP in the last 168 hours. Lipid Profile: Recent Labs    04/12/21 1154  CHOL 141  HDL 53  LDLCALC 76  TRIG 55   Thyroid Function Tests: No results for input(s): TSH, T4TOTAL, FREET4, T3FREE, THYROIDAB in the last 72 hours. Anemia Panel: Recent Labs    04/12/21 1154  VITAMINB12 424   Urine analysis:    Component Value Date/Time   COLORURINE AMBER (A) 04/13/2021 0058   APPEARANCEUR HAZY (A) 04/13/2021 0058   APPEARANCEUR Clear  04/12/2021 1149   LABSPEC 1.026 04/13/2021 0058   PHURINE 5.0 04/13/2021 0058   GLUCOSEU NEGATIVE 04/13/2021 0058   HGBUR NEGATIVE 04/13/2021 0058   BILIRUBINUR NEGATIVE 04/13/2021 0058   BILIRUBINUR Negative 04/12/2021 1149   KETONESUR NEGATIVE 04/13/2021 0058   PROTEINUR 30 (A) 04/13/2021 0058   NITRITE NEGATIVE 04/13/2021 0058   LEUKOCYTESUR NEGATIVE 04/13/2021 0058   Sepsis Labs: @LABRCNTIP (procalcitonin:4,lacticacidven:4)  ) Recent Results (from the past 240 hour(s))  Microscopic Examination     Status: Abnormal   Collection Time: 04/12/21 11:49 AM   BLD  Result Value Ref Range Status   WBC,  UA None seen 0 - 5 /hpf Final   RBC 0-2 0 - 2 /hpf Final   Epithelial Cells (non renal) 0-10 0 - 10 /hpf Final   Mucus, UA Present (A) Not Estab. Final   Bacteria, UA None seen None seen/Few Final  Resp Panel by RT-PCR (Flu A&B, Covid) Nasopharyngeal Swab     Status: None   Collection Time: 04/12/21  3:26 PM   Specimen: Nasopharyngeal Swab; Nasopharyngeal(NP) swabs in vial transport medium  Result Value Ref Range Status   SARS Coronavirus 2 by RT PCR NEGATIVE NEGATIVE Final    Comment: (NOTE) SARS-CoV-2 target nucleic acids are NOT DETECTED.  The SARS-CoV-2 RNA is generally detectable in upper respiratory specimens during the acute phase of infection. The lowest concentration of SARS-CoV-2 viral copies this assay can detect is 138 copies/mL. A negative result does not preclude SARS-Cov-2 infection and should not be used as the sole basis for treatment or other patient management decisions. A negative result may occur with  improper specimen collection/handling, submission of specimen other than nasopharyngeal swab, presence of viral mutation(s) within the areas targeted by this assay, and inadequate number of viral copies(<138 copies/mL). A negative result must be combined with clinical observations, patient history, and epidemiological information. The expected result is  Negative.  Fact Sheet for Patients:  BloggerCourse.com  Fact Sheet for Healthcare Providers:  SeriousBroker.it  This test is no t yet approved or cleared by the [place] FDA and  has been authorized for detection and/or diagnosis of SARS-CoV-2 by FDA under an Emergency Use Authorization (EUA). This EUA will remain  in effect (meaning this test can be used) for the duration of the COVID-19 declaration under Section 564(b)(1) of the Act, 21 U.S.C.section 360bbb-3(b)(1), unless the authorization is terminated  or revoked sooner.       Influenza A by PCR NEGATIVE NEGATIVE Final   Influenza B by PCR NEGATIVE NEGATIVE Final    Comment: (NOTE) The Xpert Xpress SARS-CoV-2/FLU/RSV plus assay is intended as an aid in the diagnosis of influenza from Nasopharyngeal swab specimens and should not be used as a sole basis for treatment. Nasal washings and aspirates are unacceptable for Xpert Xpress SARS-CoV-2/FLU/RSV testing.  Fact Sheet for Patients: BloggerCourse.com  Fact Sheet for Healthcare Providers: SeriousBroker.it  This test is not yet approved or cleared by the [place] FDA and has been authorized for detection and/or diagnosis of SARS-CoV-2 by FDA under an Emergency Use Authorization (EUA). This EUA will remain in effect (meaning this test can be used) for the duration of the COVID-19 declaration under Section 564(b)(1) of the Act, 21 U.S.C. section 360bbb-3(b)(1), unless the authorization is terminated or revoked.  Performed at [facility], [address] [place], [place] [address]   Blood culture (single)     Status: None (Preliminary result)   Collection Time: 04/12/21  3:26 PM   Specimen: BLOOD  Result Value Ref Range Status   Specimen Description BLOOD BLOOD RIGHT FOREARM  Final   Special Requests   Final    BOTTLES DRAWN AEROBIC AND ANAEROBIC Blood  Culture adequate volume   Culture   Final    NO GROWTH 2 DAYS Performed at [facility], [address], [place], [place] [address]    Report Status PENDING  Incomplete  Culture, blood (single)     Status: None (Preliminary result)   Collection Time: 04/13/21 12:06 AM   Specimen: BLOOD RIGHT FOREARM  Result Value Ref Range Status   Specimen Description  BLOOD RIGHT FOREARM  Final   Special Requests   Final    BOTTLES DRAWN AEROBIC AND ANAEROBIC Blood Culture adequate volume   Culture   Final    NO GROWTH 1 DAY Performed at [facility], [address] [place], [place] [address]    Report Status PENDING  Incomplete         Radiology Studies: DG Chest 2 View  Result Date: 04/12/2021 CLINICAL DATA:  questionable aspiration EXAM: CHEST - 2 VIEW COMPARISON:  None. FINDINGS: Patient positioning/rotation limits evaluation. New patchy interstitial airspace opacities in the right lung. Left lung is clear. No visible pleural effusions or pneumothorax. Cardiomediastinal silhouette is likely within normal limits when accounting for patient rotation. Left subclavian approach dual lead cardiac rhythm maintenance device. Exaggerated thoracic kyphosis with multilevel thoracic degenerative change and osteopenia. IMPRESSION: New patchy interstitial airspace opacities in the right lung, concerning for aspiration and/or pneumonia. Electronically Signed   By: Feliberto Harts MD   On: 04/12/2021 17:04   CT HEAD WO CONTRAST ( )  Result Date: 04/12/2021 CLINICAL DATA:  Mental status change, unknown cause. Additional history provided: Patient's daughter repeats patient experiencing progressive weakness for the past 2 weeks, patient having "difficulty getting words out." EXAM: CT HEAD WITHOUT CONTRAST TECHNIQUE: Contiguous axial images were obtained from the base of the skull through the vertex without intravenous contrast. COMPARISON:  Head CT 01/11/2011. FINDINGS: Brain: Mild-to-moderate  generalized cerebral atrophy. Comparatively mild cerebellar atrophy. Mild patchy and ill-defined hypoattenuation within the cerebral white matter, nonspecific but compatible with chronic small vessel ischemic disease. Chronic appearing lacunar infarct within the left caudate nucleus. There is no acute intracranial hemorrhage. No demarcated cortical infarct. No extra-axial fluid collection. No evidence of an intracranial mass. No midline shift. Vascular: No hyperdense vessel.  Atherosclerotic calcifications. Skull: Normal. Negative for fracture or focal lesion. Sinuses/Orbits: Visualized orbits show no acute finding. Postsurgical appearance of the paranasal sinuses. Mild mucosal thickening within the bilateral ethmoid air cells/ethmoidectomy cavities. Small right maxillary sinus fluid level. IMPRESSION: No evidence of acute intracranial abnormality. Mild chronic small vessel ischemic changes within the cerebral white matter. Chronic appearing left basal ganglia lacunar infarct. Mild-to-moderate generalized cerebral atrophy. Comparatively mild cerebellar atrophy. Paranasal sinus disease at the imaged levels, as described. Electronically Signed   By: Jackey Loge DO   On: 04/12/2021 16:21        Scheduled Meds:  aspirin EC  81 mg Oral Daily   clopidogrel  75 mg Oral Daily   enoxaparin (LOVENOX) injection  40 mg Subcutaneous Q24H   gabapentin  600 mg Oral BID   metoprolol tartrate  25 mg Oral Daily   mirtazapine  7.5 mg Oral QHS   tamsulosin  0.4 mg Oral Daily   Continuous Infusions:  azithromycin Stopped (04/14/21 0031)   cefTRIAXone (ROCEPHIN)  IV 2 g (04/14/21 1121)     LOS: 1 day    Time spent: 25 minutes    Alberteen Sam, MD [facility] 04/14/2021, 2:13 PM     Please page though AMION or Epic secure chat:  For password, contact charge nurse

## 2021-04-14 NOTE — Plan of Care (Signed)
Pt orientedx2, VSS. C/o pain in feet/ankles, addressed with repositioning and PRN tylenol with some relief. IV ABX infused w/o issue. Adequate UO, no BM this shift. Tolerated PO meds well with applesauce, continues to state sometimes "it feels like it just gets stuck in my throat and won't go down." Fall/safety precautions in place, rounding performed, needs/concerns addressed.   Problem: Education: Goal: Knowledge of General Education information will improve Description: Including pain rating scale, medication(s)/side effects and non-pharmacologic comfort measures Outcome: Progressing   Problem: Health Behavior/Discharge Planning: Goal: Ability to manage health-related needs will improve Outcome: Progressing   Problem: Clinical Measurements: Goal: Ability to maintain clinical measurements within normal limits will improve Outcome: Progressing Goal: Will remain free from infection Outcome: Progressing Goal: Diagnostic test results will improve Outcome: Progressing Goal: Respiratory complications will improve Outcome: Progressing Goal: Cardiovascular complication will be avoided Outcome: Progressing   Problem: Nutrition: Goal: Adequate nutrition will be maintained Outcome: Progressing   Problem: Coping: Goal: Level of anxiety will decrease Outcome: Progressing   Problem: Elimination: Goal: Will not experience complications related to bowel motility Outcome: Progressing Goal: Will not experience complications related to urinary retention Outcome: Progressing   Problem: Pain Managment: Goal: General experience of comfort will improve Outcome: Progressing   Problem: Safety: Goal: Ability to remain free from injury will improve Outcome: Progressing   Problem: Skin Integrity: Goal: Risk for impaired skin integrity will decrease Outcome: Progressing

## 2021-04-14 NOTE — TOC Progression Note (Addendum)
Transition of Care [facility]) - Progression Note    Patient Details  Name: Jeffery Horne MRN: [id number] Date of Birth: [birth year]  [facility]) CM/SW Contact  Caryn Section, RN Phone Number: 04/14/2021, 2:46 PM  Clinical Narrative:   Patient will be discharged to return to [facility] tomorrow.  Patient and family aware of transfer.  [facility] aware of patient transfer tomorrow, they state they plan to take patient back and are aware he will transfer on 6Aug2022   Addendum:  Contacted the facility for confirmation, and they state they will have to assess patient first.  RNCM emphasized that patient can return tomorrow, and that this was confirmed with facility yesterday.        Expected Discharge Plan and Services                                                 Social Determinants of Health (SDOH) Interventions    Readmission Risk Interventions No flowsheet data found.

## 2021-04-15 DIAGNOSIS — J690 Pneumonitis due to inhalation of food and vomit: Secondary | ICD-10-CM | POA: Diagnosis not present

## 2021-04-15 MED ORDER — AZITHROMYCIN 250 MG PO TABS: 250.0000 mg | ORAL_TABLET | Freq: Every day | ORAL | 0 refills | Status: DC

## 2021-04-15 MED ORDER — CEFPODOXIME PROXETIL 200 MG PO TABS: 200.0000 mg | ORAL_TABLET | Freq: Two times a day (BID) | ORAL | 0 refills | Status: DC

## 2021-04-15 MED ORDER — ACETAMINOPHEN 325 MG PO TABS: 650.0000 mg | ORAL_TABLET | Freq: Four times a day (QID) | ORAL | Status: AC | PRN

## 2021-04-15 NOTE — TOC Transition Note (Addendum)
Transition of Care [facility]) - CM/SW Discharge Note   Patient Details  Name: Jeffery Horne MRN: [id number] Date of Birth: [birth year]  Transition of Care [facility]) CM/SW Contact:  [name], LCSW Phone Number: 04/15/2021, 12:35 PM   Clinical Narrative:   Patient is discharging back to [name] today. Confirmed with [name] at [facility] that they can accept patient back today. FL2 with DC Meds completed and placed in DC folder. [name] reported [name] will set up [facility] services for patient on their end. Confirmed with daughter [name] who plans to pick up patient around 1:30 or 2:00 pm today. Notified RN. No additional TOC needs identified prior to discharge.  3:10- Notified by RN of concerns regarding patient returning to ALF. Asking for PT to assess. CSW reached out to PT to see if they can see patient this afternoon if possible.  3:45- Notified [name] with [facility] of delay in DC. PT reeval pending, started SNF work up in case taht is recommended.   Final next level of care: Assisted Living Barriers to Discharge: Barriers Resolved   Patient Goals and CMS Choice Patient states their goals for this hospitalization and ongoing recovery are:: ALF CMS Medicare.gov Compare Post Acute Care list provided to:: Patient Represenative (must comment) Choice offered to / list presented to : Adult Children  Discharge Placement                Patient to be transferred to facility by: daughter [name] Name of family member notified: daughter [name] Patient and family notified of of transfer: 04/15/21  Discharge Plan and Services                                     Social Determinants of Health (SDOH) Interventions     Readmission Risk Interventions No flowsheet data found.

## 2021-04-15 NOTE — Progress Notes (Signed)
Per MD, C. Danford. Pt d/c on hold until PT reevaluate.

## 2021-04-15 NOTE — Plan of Care (Signed)
Pt present with weakness throughout shift plan initially to d/c ALF. Upon attempting to prepare pt for d/c. Pt presented with weakness inability to maintain sitting posture while attempting to get dressed. Pt unable to position feet and rise to standing position. Pt was returned to lying position in bed with assistant of my self another RN and NT (3 person assist). MD notified.  Pt and family notified of change in plan of care. Pt family requested to see MD concerning pt health status and safety for d/c. MD made aware. MD observed with family following this request. D/c placed on hold pending PT reassessment and evaluation. Following follow up with physical therapy and reassessment of Pt via MD. D/C order canceled.

## 2021-04-15 NOTE — Plan of Care (Signed)
Pt rested during overnight. Compliant with meds. Tylenol given and analgesic cream for ankle pain. Pt ambulated back to bed max 2 assist. Pt turned approx every 2 hours due to blister and redness to sacrum with small open area. Pt informed and made aware of importance to readjust position. Foam pad in place. Pt used incentive spirometer and flutter valve.  Problem: Education: Goal: Knowledge of General Education information will improve Description: Including pain rating scale, medication(s)/side effects and non-pharmacologic comfort measures Outcome: Progressing   Problem: Health Behavior/Discharge Planning: Goal: Ability to manage health-related needs will improve Outcome: Progressing   Problem: Clinical Measurements: Goal: Ability to maintain clinical measurements within normal limits will improve Outcome: Progressing Goal: Will remain free from infection Outcome: Progressing Goal: Diagnostic test results will improve Outcome: Progressing Goal: Respiratory complications will improve Outcome: Progressing Goal: Cardiovascular complication will be avoided Outcome: Progressing   Problem: Activity: Goal: Risk for activity intolerance will decrease Outcome: Progressing   Problem: Nutrition: Goal: Adequate nutrition will be maintained Outcome: Progressing   Problem: Coping: Goal: Level of anxiety will decrease Outcome: Progressing   Problem: Elimination: Goal: Will not experience complications related to bowel motility Outcome: Progressing Goal: Will not experience complications related to urinary retention Outcome: Progressing   Problem: Pain Managment: Goal: General experience of comfort will improve Outcome: Progressing   Problem: Safety: Goal: Ability to remain free from injury will improve Outcome: Progressing   Problem: Skin Integrity: Goal: Risk for impaired skin integrity will decrease Outcome: Progressing

## 2021-04-15 NOTE — Discharge Summary (Signed)
Physician Discharge Summary  Jeffery Horne TKW:[id number] DOB: 04/24/1927 DOA: 04/12/2021  PCP: Marjie Skiff, NP  Admit date: 04/12/2021 Discharge date: 04/15/2021  Admitted From: ALF  Disposition:  ALF with HH  Recommendations for Outpatient Follow-up:  Follow up with PCP Jolene Cannady in 1 week      Home Health: PT/OT due to ongoing balance issues  Equipment/Devices: None new  Discharge Condition: Good  CODE STATUS: DNR Diet recommendation: Regular, with thin liquids  Brief/Interim Summary: Jeffery Horne is a 87 y.o. M with DM, vascular disease, and HTN who presented with few days weakness and progressive decrease in functional level, then a severe choking episode, confusion and hypoxia and cough.   At baseline the patient is able to transfer, do self cares, but over the last several days he has gotten weaker and weaker until he is unable to get up without assistance.  Then today he developed a fever, had a prolonged severe choking episode, and appeared confused so 911 was called.   In the ER, CXR showed pneumonia.       PRINCIPAL HOSPITAL DIAGNOSIS: Aspiration pneumonia    Discharge Diagnoses:  Aspiration pneumonia Patient presented with fever, tachypnea, and hypoxia in setting of pneumonia on chest x-ray.  Sepsis ruled out, no real end organ damage.   Admitted and started on Rocephin.  Fever curve improved.  SLP consulted, patient able to consume all textures in a timely manner with complete oral clearing.  Education given regarding minimizing environmental distractions while eating, slow rate and small sips/bites as well as seated upright at 90 degrees when eating.  Patient now mentating at baseline, taking orals.  Temp < 100 F, heart rate < 100bpm, RR < 24, SpO2 at baseline.   Stable for discharge.     Continue azithromycin for 2 more days, and cefpodoxime for 4 more days     Respiratory failure ruled out   Acute metabolic encephalopathy He  is normally  oriented to person place and time, but at the presentation he was oriented to self only, confused about any other details.   He is oriented to person, time, place and situation.  Encephalopathy resolved.     Hypertension Vascular disease, secondary prevention  Diabetes, type II with polyneuropathy A1c controlled off of medications, glucose normal here  BPH   Dementia          Discharge Instructions  Discharge Instructions     Diet - low sodium heart healthy   Complete by: As directed    Discharge instructions   Complete by: As directed    From Dr. Maryfrances Bunnell: You were admitted with pneumonia You were treated here with antibiotics You should complete the course of antibiotics at home: Take cefpodoxime 200 mg twice daily starting tonight for 4 more days (through Wednesday) Take azithromycin tonight and tomorrow night   Also, use the incentive spirometer until you stop coughing up phlegm, for the next week or so. Do this at least every hour When you use them, use the clear incentive spirometer for one deep breath Then use the green flutter valve until you cough  Go see your primary care doctor in 1 week   Increase activity slowly   Complete by: As directed       Allergies as of 04/15/2021   No Known Allergies      Medication List     TAKE these medications    acetaminophen 325 MG tablet Commonly known as: TYLENOL Take 2 tablets (650 mg  total) by mouth every 6 (six) hours as needed for mild pain (or Fever >/= 101).   aspirin EC 81 MG tablet Take 81 mg by mouth daily.   azithromycin 250 MG tablet Commonly known as: Zithromax Z-Pak Take 1 tablet (250 mg total) by mouth at bedtime for 2 days. Take 2 tablets (500 mg) on  Day 1,  followed by 1 tablet (250 mg) once daily on Days 2 through 5.   cefpodoxime 200 MG tablet Commonly known as: VANTIN Take 1 tablet (200 mg total) by mouth 2 (two) times daily.   clopidogrel 75 MG tablet Commonly known as:  PLAVIX Take 75 mg by mouth daily.   clotrimazole-betamethasone cream Commonly known as: LOTRISONE Apply 1 application topically 2 (two) times daily.   Eucerin Intensive Repair Lotn Apply 1 application topically at bedtime.   fexofenadine 180 MG tablet Commonly known as: ALLEGRA Take 180 mg by mouth daily.   gabapentin 300 MG capsule Commonly known as: NEURONTIN Take 2 capsules (600 mg total) by mouth 2 (two) times daily.   glucose blood test strip For testing blood sugar twice per day.   hydrocortisone 1 % ointment Apply 1 application topically at bedtime.   metoprolol tartrate 25 MG tablet Commonly known as: LOPRESSOR TAKE 1 TABLET BY MOUTH EVERY DAY   mirtazapine 7.5 MG tablet Commonly known as: REMERON Take 1 tablet (7.5 mg total) by mouth at bedtime.   multivitamin with minerals Tabs tablet Take 1 tablet by mouth daily.   tamsulosin 0.4 MG Caps capsule Commonly known as: FLOMAX TAKE 1 CAPSULE BY MOUTH EVERY DAY   vitamin B-12 1000 MCG tablet Commonly known as: CYANOCOBALAMIN Take 1,000 mcg by mouth daily.        Follow-up Information     Marjie Skiffannady, Jolene T, NP. Schedule an appointment as soon as possible for a visit in 1 week(s).   Specialty: Nurse Practitioner Contact information: [address] [place] [id number] [phone]                No Known Allergies     Procedures/Studies: DG Chest 2 View  Result Date: 04/12/2021 CLINICAL DATA:  questionable aspiration EXAM: CHEST - 2 VIEW COMPARISON:  None. FINDINGS: Patient positioning/rotation limits evaluation. New patchy interstitial airspace opacities in the right lung. Left lung is clear. No visible pleural effusions or pneumothorax. Cardiomediastinal silhouette is likely within normal limits when accounting for patient rotation. Left subclavian approach dual lead cardiac rhythm maintenance device. Exaggerated thoracic kyphosis with multilevel thoracic degenerative change and osteopenia.  IMPRESSION: New patchy interstitial airspace opacities in the right lung, concerning for aspiration and/or pneumonia. Electronically Signed   By: Feliberto HartsFrederick S Jones MD   On: 04/12/2021 17:04   CT HEAD WO CONTRAST (5MM)  Result Date: 04/12/2021 CLINICAL DATA:  Mental status change, unknown cause. Additional history provided: Patient's daughter repeats patient experiencing progressive weakness for the past 2 weeks, patient having "difficulty getting words out." EXAM: CT HEAD WITHOUT CONTRAST TECHNIQUE: Contiguous axial images were obtained from the base of the skull through the vertex without intravenous contrast. COMPARISON:  Head CT 01/11/2011. FINDINGS: Brain: Mild-to-moderate generalized cerebral atrophy. Comparatively mild cerebellar atrophy. Mild patchy and ill-defined hypoattenuation within the cerebral white matter, nonspecific but compatible with chronic small vessel ischemic disease. Chronic appearing lacunar infarct within the left caudate nucleus. There is no acute intracranial hemorrhage. No demarcated cortical infarct. No extra-axial fluid collection. No evidence of an intracranial mass. No midline shift. Vascular: No hyperdense vessel.  Atherosclerotic calcifications. Skull: Normal. Negative for fracture or focal lesion. Sinuses/Orbits: Visualized orbits show no acute finding. Postsurgical appearance of the paranasal sinuses. Mild mucosal thickening within the bilateral ethmoid air cells/ethmoidectomy cavities. Small right maxillary sinus fluid level. IMPRESSION: No evidence of acute intracranial abnormality. Mild chronic small vessel ischemic changes within the cerebral white matter. Chronic appearing left basal ganglia lacunar infarct. Mild-to-moderate generalized cerebral atrophy. Comparatively mild cerebellar atrophy. Paranasal sinus disease at the imaged levels, as described. Electronically Signed   By: Jackey Loge DO   On: 04/12/2021 16:21      Subjective: No dyspnea, no confusion.  No  fever overnight.  No respiratory distress or chest pain.  He still coughing up a lot of phlegm but this is improving.  His cough is better.  He feels better.  Discharge Exam: Vitals:   04/15/21 0514 04/15/21 0801  BP: (!) 130/53 (!) 153/60  Pulse: 61 62  Resp: 16 16  Temp: 98.8 F (37.1 C) 98.9 F (37.2 C)  SpO2: 95% 95%   Vitals:   04/14/21 2018 04/14/21 2345 04/15/21 0514 04/15/21 0801  BP: (!) 152/60 (!) 118/48 (!) 130/53 (!) 153/60  Pulse: 72 61 61 62  Resp: 17 18 16 16   Temp: 98.9 F (37.2 C) 97.6 F (36.4 C) 98.8 F (37.1 C) 98.9 F (37.2 C)  TempSrc:      SpO2: 95% 91% 95% 95%  Weight:      Height:        General: Pt is alert, awake, not in acute distress, sitting up in bed eating breakfast Cardiovascular: RRR, nl S1-S2, no murmurs appreciated.   No LE edema.   Respiratory: Normal respiratory rate and rhythm.  No wheezing, some rales at the right base. Abdominal: Abdomen soft and non-tender.  No distension or HSM.   Neuro/Psych: Strength symmetric in upper and lower extremities.  Judgment and insight appear normal.   The results of significant diagnostics from this hospitalization (including imaging, microbiology, ancillary and laboratory) are listed below for reference.     Microbiology: Recent Results (from the past 240 hour(s))  Microscopic Examination     Status: Abnormal   Collection Time: 04/12/21 11:49 AM   BLD  Result Value Ref Range Status   WBC, UA None seen 0 - 5 /hpf Final   RBC 0-2 0 - 2 /hpf Final   Epithelial Cells (non renal) 0-10 0 - 10 /hpf Final   Mucus, UA Present (A) Not Estab. Final   Bacteria, UA None seen None seen/Few Final  Resp Panel by RT-PCR (Flu A&B, Covid) Nasopharyngeal Swab     Status: None   Collection Time: 04/12/21  3:26 PM   Specimen: Nasopharyngeal Swab; Nasopharyngeal(NP) swabs in vial transport medium  Result Value Ref Range Status   SARS Coronavirus 2 by RT PCR NEGATIVE NEGATIVE Final    Comment: (NOTE) SARS-CoV-2  target nucleic acids are NOT DETECTED.  The SARS-CoV-2 RNA is generally detectable in upper respiratory specimens during the acute phase of infection. The lowest concentration of SARS-CoV-2 viral copies this assay can detect is 138 copies/mL. A negative result does not preclude SARS-Cov-2 infection and should not be used as the sole basis for treatment or other patient management decisions. A negative result may occur with  improper specimen collection/handling, submission of specimen other than nasopharyngeal swab, presence of viral mutation(s) within the areas targeted by this assay, and inadequate number of viral copies(<138 copies/mL). A negative result must be combined with clinical observations, patient  history, and epidemiological information. The expected result is Negative.  Fact Sheet for Patients:  BloggerCourse.com  Fact Sheet for Healthcare Providers:  SeriousBroker.it  This test is no t yet approved or cleared by the [place] FDA and  has been authorized for detection and/or diagnosis of SARS-CoV-2 by FDA under an Emergency Use Authorization (EUA). This EUA will remain  in effect (meaning this test can be used) for the duration of the COVID-19 declaration under Section 564(b)(1) of the Act, 21 U.S.C.section 360bbb-3(b)(1), unless the authorization is terminated  or revoked sooner.       Influenza A by PCR NEGATIVE NEGATIVE Final   Influenza B by PCR NEGATIVE NEGATIVE Final    Comment: (NOTE) The Xpert Xpress SARS-CoV-2/FLU/RSV plus assay is intended as an aid in the diagnosis of influenza from Nasopharyngeal swab specimens and should not be used as a sole basis for treatment. Nasal washings and aspirates are unacceptable for Xpert Xpress SARS-CoV-2/FLU/RSV testing.  Fact Sheet for Patients: BloggerCourse.com  Fact Sheet for Healthcare  Providers: SeriousBroker.it  This test is not yet approved or cleared by the [place] FDA and has been authorized for detection and/or diagnosis of SARS-CoV-2 by FDA under an Emergency Use Authorization (EUA). This EUA will remain in effect (meaning this test can be used) for the duration of the COVID-19 declaration under Section 564(b)(1) of the Act, 21 U.S.C. section 360bbb-3(b)(1), unless the authorization is terminated or revoked.  Performed at [facility], [address] [place], [place] [address]   Blood culture (single)     Status: None (Preliminary result)   Collection Time: 04/12/21  3:26 PM   Specimen: BLOOD  Result Value Ref Range Status   Specimen Description BLOOD BLOOD RIGHT FOREARM  Final   Special Requests   Final    BOTTLES DRAWN AEROBIC AND ANAEROBIC Blood Culture adequate volume   Culture   Final    NO GROWTH 3 DAYS Performed at [facility], [address], [place], [place] [address]    Report Status PENDING  Incomplete  Culture, blood (single)     Status: None (Preliminary result)   Collection Time: 04/13/21 12:06 AM   Specimen: BLOOD RIGHT FOREARM  Result Value Ref Range Status   Specimen Description BLOOD RIGHT FOREARM  Final   Special Requests   Final    BOTTLES DRAWN AEROBIC AND ANAEROBIC Blood Culture adequate volume   Culture   Final    NO GROWTH 2 DAYS Performed at [facility], [address], [place], [place] [address]    Report Status PENDING  Incomplete     Labs: BNP (last 3 results) No results for input(s): BNP in the last 8760 hours. Basic Metabolic Panel: Recent Labs  Lab 04/12/21 1154 04/12/21 1526 04/13/21 0417  NA 140 137  --   K 3.8 3.7  --   CL 98 100  --   CO2 27 28  --   GLUCOSE 152* 177*  --   BUN 20 23  --   CREATININE 0.79 0.74 0.81  CALCIUM 8.6 8.7*  --    Liver Function Tests: Recent Labs  Lab 04/12/21 1154 04/12/21 1526  AST 22 23  ALT 11 13   ALKPHOS 80 61  BILITOT 1.3* 1.7*  PROT 6.5 6.9  ALBUMIN 3.3* 2.9*   No results for input(s): LIPASE, AMYLASE in the last 168 hours. No results for input(s): AMMONIA in the last 168 hours. CBC: Recent Labs  Lab 04/12/21 1154 04/12/21  1526 04/13/21 0417  WBC 9.3 10.1 10.8*  NEUTROABS 7.4* 8.4*  --   HGB 11.4* 10.8* 10.2*  HCT 37.0* 33.0* 31.5*  MCV 92 91.9 93.5  PLT 217 208 197   Cardiac Enzymes: No results for input(s): CKTOTAL, CKMB, CKMBINDEX, TROPONINI in the last 168 hours. BNP: Invalid input(s): POCBNP CBG: No results for input(s): GLUCAP in the last 168 hours. D-Dimer No results for input(s): DDIMER in the last 72 hours. Hgb A1c Recent Labs    04/12/21 1149 04/13/21 0417  HGBA1C 5.4 5.7*   Lipid Profile Recent Labs    04/12/21 1154  CHOL 141  HDL 53  LDLCALC 76  TRIG 55   Thyroid function studies No results for input(s): TSH, T4TOTAL, T3FREE, THYROIDAB in the last 72 hours.  Invalid input(s): FREET3 Anemia work up Recent Labs    04/12/21 1154  VITAMINB12 424   Urinalysis    Component Value Date/Time   COLORURINE AMBER (A) 04/13/2021 0058   APPEARANCEUR HAZY (A) 04/13/2021 0058   APPEARANCEUR Clear 04/12/2021 1149   LABSPEC 1.026 04/13/2021 0058   PHURINE 5.0 04/13/2021 0058   GLUCOSEU NEGATIVE 04/13/2021 0058   HGBUR NEGATIVE 04/13/2021 0058   BILIRUBINUR NEGATIVE 04/13/2021 0058   BILIRUBINUR Negative 04/12/2021 1149   KETONESUR NEGATIVE 04/13/2021 0058   PROTEINUR 30 (A) 04/13/2021 0058   NITRITE NEGATIVE 04/13/2021 0058   LEUKOCYTESUR NEGATIVE 04/13/2021 0058   Sepsis Labs Invalid input(s): PROCALCITONIN,  WBC,  LACTICIDVEN Microbiology Recent Results (from the past 240 hour(s))  Microscopic Examination     Status: Abnormal   Collection Time: 04/12/21 11:49 AM   BLD  Result Value Ref Range Status   WBC, UA None seen 0 - 5 /hpf Final   RBC 0-2 0 - 2 /hpf Final   Epithelial Cells (non renal) 0-10 0 - 10 /hpf Final   Mucus, UA  Present (A) Not Estab. Final   Bacteria, UA None seen None seen/Few Final  Resp Panel by RT-PCR (Flu A&B, Covid) Nasopharyngeal Swab     Status: None   Collection Time: 04/12/21  3:26 PM   Specimen: Nasopharyngeal Swab; Nasopharyngeal(NP) swabs in vial transport medium  Result Value Ref Range Status   SARS Coronavirus 2 by RT PCR NEGATIVE NEGATIVE Final    Comment: (NOTE) SARS-CoV-2 target nucleic acids are NOT DETECTED.  The SARS-CoV-2 RNA is generally detectable in upper respiratory specimens during the acute phase of infection. The lowest concentration of SARS-CoV-2 viral copies this assay can detect is 138 copies/mL. A negative result does not preclude SARS-Cov-2 infection and should not be used as the sole basis for treatment or other patient management decisions. A negative result may occur with  improper specimen collection/handling, submission of specimen other than nasopharyngeal swab, presence of viral mutation(s) within the areas targeted by this assay, and inadequate number of viral copies(<138 copies/mL). A negative result must be combined with clinical observations, patient history, and epidemiological information. The expected result is Negative.  Fact Sheet for Patients:  BloggerCourse.com  Fact Sheet for Healthcare Providers:  SeriousBroker.it  This test is no t yet approved or cleared by the [place] FDA and  has been authorized for detection and/or diagnosis of SARS-CoV-2 by FDA under an Emergency Use Authorization (EUA). This EUA will remain  in effect (meaning this test can be used) for the duration of the COVID-19 declaration under Section 564(b)(1) of the Act, 21 U.S.C.section 360bbb-3(b)(1), unless the authorization is terminated  or revoked sooner.  Influenza A by PCR NEGATIVE NEGATIVE Final   Influenza B by PCR NEGATIVE NEGATIVE Final    Comment: (NOTE) The Xpert Xpress SARS-CoV-2/FLU/RSV  plus assay is intended as an aid in the diagnosis of influenza from Nasopharyngeal swab specimens and should not be used as a sole basis for treatment. Nasal washings and aspirates are unacceptable for Xpert Xpress SARS-CoV-2/FLU/RSV testing.  Fact Sheet for Patients: BloggerCourse.com  Fact Sheet for Healthcare Providers: SeriousBroker.it  This test is not yet approved or cleared by the [place] FDA and has been authorized for detection and/or diagnosis of SARS-CoV-2 by FDA under an Emergency Use Authorization (EUA). This EUA will remain in effect (meaning this test can be used) for the duration of the COVID-19 declaration under Section 564(b)(1) of the Act, 21 U.S.C. section 360bbb-3(b)(1), unless the authorization is terminated or revoked.  Performed at [facility], [address] [place], [place] [address]   Blood culture (single)     Status: None (Preliminary result)   Collection Time: 04/12/21  3:26 PM   Specimen: BLOOD  Result Value Ref Range Status   Specimen Description BLOOD BLOOD RIGHT FOREARM  Final   Special Requests   Final    BOTTLES DRAWN AEROBIC AND ANAEROBIC Blood Culture adequate volume   Culture   Final    NO GROWTH 3 DAYS Performed at [facility], [address], [place], [place] [address]    Report Status PENDING  Incomplete  Culture, blood (single)     Status: None (Preliminary result)   Collection Time: 04/13/21 12:06 AM   Specimen: BLOOD RIGHT FOREARM  Result Value Ref Range Status   Specimen Description BLOOD RIGHT FOREARM  Final   Special Requests   Final    BOTTLES DRAWN AEROBIC AND ANAEROBIC Blood Culture adequate volume   Culture   Final    NO GROWTH 2 DAYS Performed at [facility], [address], [place], [place] [address]    Report Status PENDING  Incomplete     Time coordinating discharge: 25 minutes The [place] controlled substances registry was  reviewed for this patient      30 Day Unplanned Readmission Risk Score    Flowsheet Row ED to Hosp-Admission (Current) from 04/12/2021 in [facility] (1C)  30 Day Unplanned Readmission Risk Score (%) 11.76 Filed at 04/15/2021 0800       This score is the patient's risk of an unplanned readmission within 30 days of being discharged (0 -100%). The score is based on dignosis, age, lab data, medications, orders, and past utilization.   Low:  0-14.9   Medium: 15-21.9   High: 22-29.9   Extreme: 30 and above            SIGNED:   Alberteen Sam, MD  [facility] 04/15/2021, 9:31 AM

## 2021-04-15 NOTE — [place] FL2 (Signed)
[place] MEDICAID FL2 LEVEL OF CARE SCREENING TOOL     IDENTIFICATION  Patient Name: Jeffery Horne Birthdate: [birth year] Sex: male Admission Date (Current Location): 04/12/2021  [facility] and [place] Number:  Chiropodist and Address:  [facility], [address] [place], [place] [address]      Provider Number: [id number]  Attending Physician Name and Address:  Alberteen Sam, *  Relative Name and Phone Number:  Sharlyne Cai (Daughter)   [phone] [facility] Phone)    Current Level of Care: Hospital Recommended Level of Care: Assisted Living Facility Prior Approval Number:    Date Approved/Denied:   PASRR Number:    Discharge Plan:      Current Diagnoses: Patient Active Problem List   Diagnosis Date Noted   Aspiration pneumonia (HCC) 04/13/2021   Sepsis (HCC) 04/13/2021   Generalized weakness 04/13/2021   Protein-calorie malnutrition (HCC) 08/31/2020   Syncope 08/31/2020   Emphysema lung (HCC) 08/26/2020   Aortic atherosclerosis (HCC) 08/26/2020   Cardiac pacemaker 04/23/2020   Carotid artery stenosis, symptomatic, right 03/20/2018   Advanced care planning/counseling discussion 10/01/2017   Skin lesion of face 05/16/2016   Sick sinus syndrome (HCC) 07/28/2015   BPH with obstruction/lower urinary tract symptoms 07/28/2015   Hearing impairment 07/28/2015   Gait abnormality 05/25/2015   Arthritis of ankle, left 05/11/2015   Chronic hoarseness 05/11/2015   Hypertension associated with diabetes (HCC)    Type II diabetes mellitus with neurological manifestations (HCC)    Hyperlipidemia associated with type 2 diabetes mellitus (HCC)     Orientation RESPIRATION BLADDER Height & Weight     Self, Time, Situation, Place  Normal Incontinent, External catheter Weight: 130 lb 1.1 oz (59 kg) Height:  5\' 5"  (165.1 cm)  BEHAVIORAL SYMPTOMS/MOOD NEUROLOGICAL BOWEL NUTRITION STATUS      Incontinent Diet (regular diet, thin liquids)   AMBULATORY STATUS COMMUNICATION OF NEEDS Skin   Limited Assist Verbally Bruising                       Personal Care Assistance Level of Assistance  Bathing, Feeding, Dressing Bathing Assistance: Limited assistance Feeding assistance: Independent Dressing Assistance: Limited assistance     Functional Limitations Info             SPECIAL CARE FACTORS FREQUENCY                       Contractures      Additional Factors Info  Code Status, Allergies Code Status Info: DNR Allergies Info: nka           Medication List       TAKE these medications     acetaminophen 325 MG tablet Commonly known as: TYLENOL Take 2 tablets (650 mg total) by mouth every 6 (six) hours as needed for mild pain (or Fever >/= 101).    aspirin EC 81 MG tablet Take 81 mg by mouth daily.    azithromycin 250 MG tablet Commonly known as: Zithromax Z-Pak Take 1 tablet (250 mg total) by mouth at bedtime for 2 days. Take 2 tablets (500 mg) on  Day 1,  followed by 1 tablet (250 mg) once daily on Days 2 through 5.    cefpodoxime 200 MG tablet Commonly known as: VANTIN Take 1 tablet (200 mg total) by mouth 2 (two) times daily.    clopidogrel 75 MG tablet Commonly known as: PLAVIX Take 75 mg by mouth daily.  clotrimazole-betamethasone cream Commonly known as: LOTRISONE Apply 1 application topically 2 (two) times daily.    Eucerin Intensive Repair Lotn Apply 1 application topically at bedtime.    fexofenadine 180 MG tablet Commonly known as: ALLEGRA Take 180 mg by mouth daily.    gabapentin 300 MG capsule Commonly known as: NEURONTIN Take 2 capsules (600 mg total) by mouth 2 (two) times daily.    glucose blood test strip For testing blood sugar twice per day.    hydrocortisone 1 % ointment Apply 1 application topically at bedtime.    metoprolol tartrate 25 MG tablet Commonly known as: LOPRESSOR TAKE 1 TABLET BY MOUTH EVERY DAY    mirtazapine 7.5 MG tablet Commonly  known as: REMERON Take 1 tablet (7.5 mg total) by mouth at bedtime.    multivitamin with minerals Tabs tablet Take 1 tablet by mouth daily.    tamsulosin 0.4 MG Caps capsule Commonly known as: FLOMAX TAKE 1 CAPSULE BY MOUTH EVERY DAY    vitamin B-12 1000 MCG tablet Commonly known as: CYANOCOBALAMIN Take 1,000 mcg by mouth daily.      Relevant Imaging Results:  Relevant Lab Results:   Additional Information SS #: [id number]  Lavana Huckeba E Nancye Grumbine, LCSW

## 2021-04-16 ENCOUNTER — Encounter: Payer: Self-pay | Admitting: Internal Medicine

## 2021-04-16 DIAGNOSIS — J690 Pneumonitis due to inhalation of food and vomit: Secondary | ICD-10-CM | POA: Diagnosis not present

## 2021-04-16 NOTE — Progress Notes (Signed)
PROGRESS NOTE    Jeffery GreenlandLeonard K Horne  ZOX:[id number] DOB: 09/11/1926 DOA: 04/12/2021 PCP: Marjie Skiffannady, Jolene T, NP      Brief Narrative:  [name] is a 87 y.o. M with DM, vascular disease, and HTN who presented with few days weakness and progressive decrease in functional level, then a severe choking episode, confusion and hypoxia and cough.  At baseline the patient is able to transfer, do self cares, but over the last several days he has gotten weaker and weaker until he is unable to get up without assistance.  Then today he developed a fever, had a prolonged severe choking episode, and appeared confused so 911 was called.  In the ER, CXR showed pneumonia.         Assessment & Plan:  Aspiration pneumonia See discharge summary from 8/6 Patient now mentating at baseline, taking orals.  Temp < 100 F, heart rate < 100bpm, RR < 24, SpO2 at baseline.   Stable for discharge.   - Continue Rocephin - Aggressive pulmonary toilet   Respiratory failure ruled out  Acute metabolic encephalopathy Hospital delirium At baseline the patient is oriented to person, place, and time.  At presentation he was confused and disoriented, and he has had some intermittent confusion since, consistent with hypoactive delirium He is oriented.   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  He has improved and is ready for discharge from a medical standpoint, although he is considerably debilitated, requiring assist to stand, and unsteady ambulating more than a few feet.     Dispo: The patient is from: SNF              Anticipated d/c is  to: SNF              Patient currently is not medically stable to d/c.   Difficult to place patient No              MDM: The below labs and imaging reports reviewed and summarized above.  Medication management as above.   DVT prophylaxis: enoxaparin (LOVENOX) injection 40 mg Start: 04/13/21 0800  Code Status: DNR Family Communication: Daughter at bedside          Subjective: No fever.  Somnolent but no confusion.  Mucus is improving, no complaints.  No chest pain, still with some mucus production.           Objective: Vitals:   04/16/21 0522 04/16/21 0804 04/16/21 1142 04/16/21 1603  BP: (!) 133/38 (!) 140/56 (!) 92/56 (!) 139/55  Pulse: 60 62 70 63  Resp: 16 20 20 16   Temp: 99.3 F (37.4 C) 99.5 F (37.5 C) 98.5 F (36.9 C) 98.7 F (37.1 C)  TempSrc:  Oral Oral   SpO2: 92% 91% 92% 94%  Weight:      Height:        Intake/Output Summary (Last 24 hours) at 04/16/2021 1619 Last data filed at 04/16/2021 1300 Gross per 24 hour  Intake 340 ml  Output 1020 ml  Net -680 ml   Filed Weights   04/12/21 1523 04/13/21 0306  Weight: 59 kg 59 kg    Examination: General appearance: Frail elderly adult,  sitting in bed, somnolent, no obvious distress     HEENT: Dentures in place, oropharynx moist, no oral lesions, hearing diminished Skin:  Cardiac: RRR, no murmurs, no lower extremity edema, no JVD Respiratory: Normal respiratory rate and rhythm, no rales, no wheeze Abdomen: Abdomen soft without tenderness palpation or, can, no ascites or distention MSK:  Neuro: Awake and alert, extraocular movements intact, moves all extremities with generalized weakness, speech fluent Psych: Sleepy but arouses, oriented to situation.      Data Reviewed: I have personally reviewed following labs and imaging studies:  CBC: Recent Labs  Lab 04/12/21 1154 04/12/21 1526 04/13/21 0417  WBC 9.3 10.1 10.8*  NEUTROABS 7.4* 8.4*  --   HGB 11.4* 10.8* 10.2*  HCT 37.0*  33.0* 31.5*  MCV 92 91.9 93.5  PLT 217 208 197   Basic Metabolic Panel: Recent Labs  Lab 04/12/21 1154 04/12/21 1526 04/13/21 0417  NA 140 137  --   K 3.8 3.7  --   CL 98 100  --   CO2 27 28  --   GLUCOSE 152* 177*  --   BUN 20 23  --   CREATININE 0.79 0.74 0.81  CALCIUM 8.6 8.7*  --    GFR: Estimated Creatinine Clearance: 48.6 mL/min (by C-G formula based on SCr of 0.81 mg/dL). Liver Function Tests: Recent Labs  Lab 04/12/21 1154 04/12/21 1526  AST 22 23  ALT 11 13  ALKPHOS 80 61  BILITOT 1.3* 1.7*  PROT 6.5 6.9  ALBUMIN 3.3* 2.9*   No results for input(s): LIPASE, AMYLASE in the last 168 hours. No results for input(s): AMMONIA in the last 168 hours. Coagulation Profile: Recent Labs  Lab 04/12/21 1526 04/13/21 0417  INR 1.4* 1.5*   Cardiac Enzymes: No results for input(s): CKTOTAL, CKMB, CKMBINDEX, TROPONINI in the last 168 hours. BNP (last 3 results) No results for input(s): PROBNP in the last 8760 hours. HbA1C: No results for input(s): HGBA1C in the last 72 hours.  CBG: No results for input(s): GLUCAP in the last 168 hours. Lipid Profile: No results for input(s): CHOL, HDL, LDLCALC, TRIG, CHOLHDL, LDLDIRECT in the last 72 hours.  Thyroid Function Tests: No results for input(s): TSH, T4TOTAL, FREET4, T3FREE, THYROIDAB in the last 72 hours. Anemia Panel: No results for input(s): VITAMINB12, FOLATE, FERRITIN, TIBC, IRON, RETICCTPCT in the last 72 hours.  Urine analysis:    Component Value Date/Time   COLORURINE AMBER (A) 04/13/2021 0058   APPEARANCEUR HAZY (A) 04/13/2021 0058   APPEARANCEUR Clear 04/12/2021 1149   LABSPEC 1.026 04/13/2021 0058   PHURINE 5.0 04/13/2021 0058   GLUCOSEU NEGATIVE 04/13/2021 0058   HGBUR NEGATIVE 04/13/2021 0058   BILIRUBINUR NEGATIVE 04/13/2021 0058   BILIRUBINUR Negative 04/12/2021 1149   KETONESUR NEGATIVE 04/13/2021 0058   PROTEINUR 30 (A) 04/13/2021 0058   NITRITE NEGATIVE 04/13/2021 0058   LEUKOCYTESUR  NEGATIVE 04/13/2021 0058   Sepsis Labs: @LABRCNTIP (procalcitonin:4,lacticacidven:4)  ) Recent Results (from the past 240 hour(s))  Microscopic Examination     Status: Abnormal   Collection Time: 04/12/21 11:49 AM   BLD  Result Value Ref Range Status   WBC, UA None seen 0 - 5 /hpf Final   RBC 0-2 0 - 2 /hpf Final   Epithelial Cells (non renal) 0-10 0 - 10 /hpf Final   Mucus, UA Present (A) Not Estab. Final   Bacteria, UA None seen None seen/Few Final  Resp Panel by RT-PCR (Flu A&B, Covid) Nasopharyngeal Swab     Status:  None   Collection Time: 04/12/21  3:26 PM   Specimen: Nasopharyngeal Swab; Nasopharyngeal(NP) swabs in vial transport medium  Result Value Ref Range Status   SARS Coronavirus 2 by RT PCR NEGATIVE NEGATIVE Final    Comment: (NOTE) SARS-CoV-2 target nucleic acids are NOT DETECTED.  The SARS-CoV-2 RNA is generally detectable in upper respiratory specimens during the acute phase of infection. The lowest concentration of SARS-CoV-2 viral copies this assay can detect is 138 copies/mL. A negative result does not preclude SARS-Cov-2 infection and should not be used as the sole basis for treatment or other patient management decisions. A negative result may occur with  improper specimen collection/handling, submission of specimen other than nasopharyngeal swab, presence of viral mutation(s) within the areas targeted by this assay, and inadequate number of viral copies(<138 copies/mL). A negative result must be combined with clinical observations, patient history, and epidemiological information. The expected result is Negative.  Fact Sheet for Patients:  BloggerCourse.com  Fact Sheet for Healthcare Providers:  SeriousBroker.it  This test is no t yet approved or cleared by the [place] FDA and  has been authorized for detection and/or diagnosis of SARS-CoV-2 by FDA under an Emergency Use Authorization (EUA). This  EUA will remain  in effect (meaning this test can be used) for the duration of the COVID-19 declaration under Section 564(b)(1) of the Act, 21 U.S.C.section 360bbb-3(b)(1), unless the authorization is terminated  or revoked sooner.       Influenza A by PCR NEGATIVE NEGATIVE Final   Influenza B by PCR NEGATIVE NEGATIVE Final    Comment: (NOTE) The Xpert Xpress SARS-CoV-2/FLU/RSV plus assay is intended as an aid in the diagnosis of influenza from Nasopharyngeal swab specimens and should not be used as a sole basis for treatment. Nasal washings and aspirates are unacceptable for Xpert Xpress SARS-CoV-2/FLU/RSV testing.  Fact Sheet for Patients: BloggerCourse.com  Fact Sheet for Healthcare Providers: SeriousBroker.it  This test is not yet approved or cleared by the [place] FDA and has been authorized for detection and/or diagnosis of SARS-CoV-2 by FDA under an Emergency Use Authorization (EUA). This EUA will remain in effect (meaning this test can be used) for the duration of the COVID-19 declaration under Section 564(b)(1) of the Act, 21 U.S.C. section 360bbb-3(b)(1), unless the authorization is terminated or revoked.  Performed at [facility], [address] [place], [place] [address]   Blood culture (single)     Status: None (Preliminary result)   Collection Time: 04/12/21  3:26 PM   Specimen: BLOOD  Result Value Ref Range Status   Specimen Description BLOOD BLOOD RIGHT FOREARM  Final   Special Requests   Final    BOTTLES DRAWN AEROBIC AND ANAEROBIC Blood Culture adequate volume   Culture   Final    NO GROWTH 4 DAYS Performed at [facility], [address] [place], [place] [address]    Report Status PENDING  Incomplete  Culture, blood (single)     Status: None (Preliminary result)   Collection Time: 04/13/21 12:06 AM   Specimen: BLOOD RIGHT FOREARM  Result Value Ref Range Status   Specimen  Description BLOOD RIGHT FOREARM  Final   Special Requests   Final    BOTTLES DRAWN AEROBIC AND ANAEROBIC Blood Culture adequate volume   Culture   Final    NO GROWTH 3 DAYS Performed at [facility], [address] [place], [place] [address]    Report Status PENDING  Incomplete  Radiology Studies: No results found.      Scheduled Meds:  aspirin EC  81 mg Oral Daily   clopidogrel  75 mg Oral Daily   enoxaparin (LOVENOX) injection  40 mg Subcutaneous Q24H   gabapentin  600 mg Oral BID   metoprolol tartrate  25 mg Oral Daily   mirtazapine  7.5 mg Oral QHS   tamsulosin  0.4 mg Oral Daily   Continuous Infusions:  azithromycin 500 mg (04/16/21 0032)   cefTRIAXone (ROCEPHIN)  IV 2 g (04/16/21 1459)     LOS: 3 days    Time spent: 25 minutes    Alberteen Sam, MD [facility] 04/16/2021, 4:19 PM     Please page though AMION or Epic secure chat:  For password, contact charge nurse

## 2021-04-16 NOTE — Progress Notes (Signed)
Physical Therapy Treatment Patient Details Name: Jeffery Horne MRN: [id number] DOB: [birth year] Today's Date: 04/16/2021    History of Present Illness As per MD: Jeffery Horne is a 87 y.o. male with medical history significant for BPH, HTN, DM, PVD, resident of an assisted living facility who was sent to the ED for evaluation of generalized weakness and confusion that appeared to have started several hours following a choking episode at the facility.  Over the past several days prior to the choking episode patient, who at baseline can care for himself with little assistance, has been declining and had generalized weakness.  Following the episode, he gradually appeared more confused, more lethargic and they reported O2 levels down to 89% on room air.  History is limited due to lethargy but there have been no prior reports of vomiting or diarrhea, abdominal pain or complaints of chest pain or shortness of breath. Pt, No longer on O2 via [place] and is able ot maintain SPO2>93% at all time.    PT Comments    Pt seen for PT tx with daughter Jeffery Horne) present & voicing frustration re: interrupted d/c yesterday. Pt also irritable 2/2 not receiving breakfast tray yet. On this date, pt requires min assist for supine>sit with HOB elevated, extra time, pt electing to hold to daughter's hand and encouragement to complete task without extra assistance. Pt requires multiple attempts & ultimately mod/max assist for sit>stand from EOB with RW with PT providing cuing for hand placement and technique. Pt is able to ambulate very short distance in room with RW & min assist with flexed posture that worsens with activity. Pt notes fatigue & requires seated rest break between gait & peri hygiene. Spent time with daughter discussing pt's inconsistent performance with functional mobility & discussed updating d/c recommendations to STR as she is unsure if ALF can provide current necessary level of assistance. Will continue to  follow pt acutely to progress mobility as able, focus on endurance, strengthening & balance.     Follow Up Recommendations  SNF;Supervision/Assistance - 24 hour     Equipment Recommendations   (TBD in next venue)    Recommendations for Other Services       Precautions / Restrictions Precautions Precautions: Fall Restrictions Weight Bearing Restrictions: No    Mobility  Bed Mobility Overal bed mobility: Needs Assistance Bed Mobility: Supine to Sit     Supine to sit: Min assist;HOB elevated     General bed mobility comments: elects to hold to daughter's hand, requires encouragement to complete task, extra time to complete movement.    Transfers Overall transfer level: Needs assistance Equipment used: Rolling walker (2 wheeled) Transfers: Sit to/from Stand Sit to Stand: Mod assist;Max assist         General transfer comment: cuing for hand placement, assistance to power up to standing  Ambulation/Gait Ambulation/Gait assistance: Min assist Gait Distance (Feet): 10 Feet Assistive device: Rolling walker (2 wheeled) Gait Pattern/deviations: Trunk flexed;Decreased step length - right;Decreased step length - left;Decreased stride length Gait velocity: decreased   General Gait Details: progressively worsening flexed posture   Stairs             Wheelchair Mobility    Modified Rankin (Stroke Patients Only)       Balance Overall balance assessment: Needs assistance Sitting-balance support: Bilateral upper extremity supported Sitting balance-Leahy Scale: Fair     Standing balance support: Bilateral upper extremity supported Standing balance-Leahy Scale: Poor  Cognition Arousal/Alertness: Awake/alert Behavior During Therapy:  (irritable) Overall Cognitive Status: Within Functional Limits for tasks assessed                                 General Comments: follows 1 step commands consistently       Exercises      General Comments General comments (skin integrity, edema, etc.): Pt unaware of incontinent BM - requires dependent assist for peri hygiene.      Pertinent Vitals/Pain Pain Assessment: No/denies pain    Home Living                      Prior Function            PT Goals (current goals can now be found in the care plan section) Acute Rehab PT Goals Patient Stated Goal: To "get out of here" PT Goal Formulation: With patient/family Potential to Achieve Goals: Fair Progress towards PT goals: Progressing toward goals    Frequency    Min 2X/week      PT Plan Discharge plan needs to be updated    Co-evaluation              AM-PAC PT "6 Clicks" Mobility   Outcome Measure  Help needed turning from your back to your side while in a flat bed without using bedrails?: A Little Help needed moving from lying on your back to sitting on the side of a flat bed without using bedrails?: A Lot Help needed moving to and from a bed to a chair (including a wheelchair)?: A Lot Help needed standing up from a chair using your arms (e.g., wheelchair or bedside chair)?: A Lot Help needed to walk in hospital room?: A Little Help needed climbing 3-5 steps with a railing? : Total 6 Click Score: 13    End of Session Equipment Utilized During Treatment: Gait belt Activity Tolerance: Patient limited by fatigue Patient left: in chair;with call bell/phone within reach;with chair alarm set;with family/visitor present   PT Visit Diagnosis: Unsteadiness on feet (R26.81);Muscle weakness (generalized) (M62.81);Difficulty in walking, not elsewhere classified (R26.2)     Time: 0981-1914 PT Time Calculation (min) (ACUTE ONLY): 35 min  Charges:  $Therapeutic Activity: 23-37 mins                     Aleda Grana, PT, DPT 04/16/21, 9:43 AM    Sandi Mariscal 04/16/2021, 9:40 AM

## 2021-04-16 NOTE — Progress Notes (Addendum)
P'st MEW was 3 d/t elevated temp and respiration. Performed IS and administered Tylenol, Contacted ICU charge nurse Desma Mcgregor) and HCP.    04/16/21 2048  Vitals  Temp (!) 102.4 F (39.1 C)  Temp Source Oral  BP 126/81  MAP (mmHg) 87  BP Location Left Arm  BP Method Automatic  Patient Position (if appropriate) Lying  Pulse Rate 66  Pulse Rate Source Monitor  Resp (!) 21  MEWS COLOR  MEWS Score Color Yellow  Oxygen Therapy  SpO2 94 %  O2 Device Room Air  MEWS Score  MEWS Temp 2  MEWS Systolic 0  MEWS Pulse 0  MEWS RR 1  MEWS LOC 0  MEWS Score 3  Provider Notification  Provider Name/Title Doylene Canard, MD  Date Provider Notified 04/16/21  Time Provider Notified 2123  Notification Type Page  Notification Reason Change in status  Rapid Response Notification  Name of Rapid Response RN Notified ICU Charge Nurse  Date Rapid Response Notified 04/16/21  Time Rapid Response Notified 2100

## 2021-04-17 ENCOUNTER — Inpatient Hospital Stay: Payer: Medicare Other

## 2021-04-17 DIAGNOSIS — J690 Pneumonitis due to inhalation of food and vomit: Secondary | ICD-10-CM | POA: Diagnosis not present

## 2021-04-17 LAB — EXPECTORATED SPUTUM ASSESSMENT W GRAM STAIN, RFLX TO RESP C

## 2021-04-17 LAB — CBC
HCT: 30.2 % — ABNORMAL LOW (ref 39.0–52.0)
Hemoglobin: 9.6 g/dL — ABNORMAL LOW (ref 13.0–17.0)
MCH: 29.8 pg (ref 26.0–34.0)
MCHC: 31.8 g/dL (ref 30.0–36.0)
MCV: 93.8 fL (ref 80.0–100.0)
Platelets: 248 10*3/uL (ref 150–400)
RBC: 3.22 MIL/uL — ABNORMAL LOW (ref 4.22–5.81)
RDW: 14.7 % (ref 11.5–15.5)
WBC: 9.5 10*3/uL (ref 4.0–10.5)
nRBC: 0 % (ref 0.0–0.2)

## 2021-04-17 LAB — BASIC METABOLIC PANEL
Anion gap: 5 (ref 5–15)
BUN: 13 mg/dL (ref 8–23)
CO2: 31 mmol/L (ref 22–32)
Calcium: 7.7 mg/dL — ABNORMAL LOW (ref 8.9–10.3)
Chloride: 101 mmol/L (ref 98–111)
Creatinine, Ser: 0.66 mg/dL (ref 0.61–1.24)
GFR, Estimated: >60 mL/min (ref >60)
Glucose, Bld: 170 mg/dL — ABNORMAL HIGH (ref 70–99)
Potassium: 3.5 mmol/L (ref 3.5–5.1)
Sodium: 137 mmol/L (ref 135–145)

## 2021-04-17 LAB — CULTURE, BLOOD (SINGLE)
Culture: NO GROWTH
Special Requests: ADEQUATE

## 2021-04-17 LAB — MRSA NEXT GEN BY PCR, NASAL: MRSA by PCR Next Gen: DETECTED — AB

## 2021-04-17 MED ORDER — VANCOMYCIN HCL IN DEXTROSE 1-5 GM/200ML-% IV SOLN
1000.0000 mg | INTRAVENOUS | Status: DC
  Filled 2021-04-17: qty 200

## 2021-04-17 MED ORDER — MEROPENEM 1 G IV SOLR
1.0000 g | Freq: Two times a day (BID) | INTRAVENOUS | Status: AC
  Administered 2021-04-18 (×2): 1 g via INTRAVENOUS
  Filled 2021-04-17 (×3): qty 1

## 2021-04-17 MED ORDER — PIPERACILLIN-TAZOBACTAM 3.375 G IVPB
3.3750 g | Freq: Three times a day (TID) | INTRAVENOUS | Status: DC
  Administered 2021-04-17: 3.375 g via INTRAVENOUS
  Filled 2021-04-17: qty 50

## 2021-04-17 MED ORDER — VANCOMYCIN HCL 1250 MG/250ML IV SOLN
1250.0000 mg | Freq: Once | INTRAVENOUS | Status: AC
  Administered 2021-04-17: 21:00:00 1250 mg via INTRAVENOUS
  Filled 2021-04-17: qty 250

## 2021-04-17 NOTE — Progress Notes (Signed)
Pharmacy Antibiotic Note  Jeffery Horne is a 87 y.o. male admitted on 04/12/2021 with PMH for DM, vascular disease, and HTN who presented with few days weakness and progressive decrease in functional level, then a severe choking episode, confusion and hypoxia and cough. Pt was previously on ceftriaxone and azithromycin then switched to Zosyn. Pt MRSA PCR came back positive and MD indicated pt secretions very thick. Pharmacy has been consulted for Vancomycin and Meropenem dosing for PNA.  8/8 CXR: Worsening multilobar right-sided pneumonia with small right-sided parapneumonic pleural effusion 8/8 CT chest: Large airspace opacity is noted posteriorly in the right upper and lower lobes concerning for pneumonia  Plan: Vancomycin 1250 mg IV x1 loading dose followed by 1000 mg q24 h Est AUC 520.7 Scr used: 0.8 (rounded up from 0.66) TBW used for CrCl/Ke (TBW < IBW) Obtain vanc levels around 4th or 5th dose Monitor renal function and adjust dose as clinically indicated Follow up cultures   Height: 5\' 5"  (165.1 cm) Weight: 59 kg (130 lb 1.1 oz) IBW/kg (Calculated) : 61.5  Temp (24hrs), Avg:99.4 F (37.4 C), Min:98 F (36.7 C), Max:102.4 F (39.1 C)  Recent Labs  Lab 04/12/21 1154 04/12/21 1526 04/13/21 0417 04/17/21 1559  WBC 9.3 10.1 10.8* 9.5  CREATININE 0.79 0.74 0.81 0.66  LATICACIDVEN  --  1.3  --   --      Estimated Creatinine Clearance: 49.2 mL/min (by C-G formula based on SCr of 0.66 mg/dL).    No Known Allergies  Antimicrobials this admission: 8/4 Unasyn x1 8/4 Azithromycin x1, 8/6 >> 8/8 8/4 Ceftriaxone >> 8/8 8/8 Zosyn x1 8/8 Vancomycin >> 8/8 Meropenem >>   Dose adjustments this admission:   Microbiology results: 8/4 BCx: NGTD  8/8 Sputum: Negative  8/8 MRSA PCR: positive   Thank you for allowing pharmacy to be a part of this patient's care.  10/4 Jeffery Horne 04/17/2021 7:26 PM

## 2021-04-17 NOTE — Progress Notes (Signed)
PROGRESS NOTE    Jeffery Horne  ZOX:[id number] DOB: 11/18/1926 DOA: 04/12/2021 PCP: Marjie Skiffannady, Jolene T, NP      Brief Narrative:  Jeffery Horne is a 87 y.o. M with DM, vascular disease, and HTN who presented with few days weakness and progressive decrease in functional level, then a severe choking episode, confusion and hypoxia and cough.  At baseline the patient is able to transfer, do self cares, but over the last several days he has gotten weaker and weaker until he is unable to get up without assistance.  Then today he developed a fever, had a prolonged severe choking episode, and appeared confused so 911 was called.  In the ER, CXR showed pneumonia.         Assessment & Plan:  Pneumonia, right lower and middle lobes, community acquired Possible aspiration pneumonia Sepsis ruled out, no significant systemic response or organ failure noted.  Admitted and started on Rocephin/azithromycin.  Some initial improvement.  Now in the last 48 hours, patient persistently weaker and weaker, and again febrile to 102F last night.    CT chest obtained today showed no significant effusion or empyema, but considerable persistent consolidation with gaseous areas, consistent with some necrotic pneumonia, suspect anaerobes or drug resistant infection are contributing.  - Stop Rocephin and azithromcyin - Start Zosyn - Obtain sputum culture and tailor antibiotics  - Continue aggressive pulmonary toilet      Respiratory failure ruled out  Acute metabolic encephalopathy Hospital delirium At baseline the patient is oriented to person, place, and time.  At presentation he was confused and disoriented, and he has had some intermittent confusion since, consistent with hypoactive delirium Still mild delirium.   Hypertension Vascular disease, secondary prevention BP normal - Continue aspirin, Plavix -Continue metoprolol  Diabetes, type 2, with polyneuropathy A1c controlled off of  medications, glucose normal - Continue gabapentin  BPH - Continue Flomax  Mild dementia Lives in ALF - Continue mirtazapine  Anemia No clinical blood loss.          Disposition: Status is: Inpatient  Was admitted for community-acquired pneumonia.  He has improved and is ready for discharge from a medical standpoint, although he is considerably debilitated, requiring assist to stand, and unsteady ambulating more than a few feet.     Dispo: The patient is from: SNF              Anticipated d/c is to: SNF              Patient currently is not medically stable to d/c.   Difficult to place patient No              MDM: The below labs and imaging reports reviewed and summarized above.  Medication management as above.   DVT prophylaxis: enoxaparin (LOVENOX) injection 40 mg Start: 04/13/21 0800  Code Status: DNR Family Communication: Daughter at bedside         Subjective: Fever again last night.  Still intermittently confused.  Still with persistent sputum production.  Very weak.  No chest pain, vomiting.  No aspiration observed.           Objective: Vitals:   04/17/21 0428 04/17/21 0743 04/17/21 1045 04/17/21 1207  BP: 134/76 (!) 154/66 (!) 119/53 106/64  Pulse: 64 61 62 65  Resp: 18 16 20 16   Temp: 98.6 F (37 C) 99.9 F (37.7 C) 99.2 F (37.3 C) 98 F (36.7 C)  TempSrc: Oral Oral Oral   SpO2: 95%  93% 93% 91%  Weight:      Height:        Intake/Output Summary (Last 24 hours) at 04/17/2021 1630 Last data filed at 04/17/2021 1359 Gross per 24 hour  Intake 480 ml  Output --  Net 480 ml   Filed Weights   04/12/21 1523 04/13/21 0306  Weight: 59 kg 59 kg    Examination: General appearance: Frail elderly male, sleeping, arouses slightly.  Appears somewhat debilitated.     HEENT: Anicteric, conjunctival pink, lids lashes normal.  No nasal deformity, discharge, or epistaxis.  Oropharynx moist, no oral lesions, dentures in place.  Hearing  diminished.   Skin:  Cardiac: Regular rate and rhythm, no murmurs, no lower extremity edema, no JVD Respiratory: Normal respiratory rate and rhythm, no wheezing, breath sounds diminished on the right, no rales Abdomen: Abdomen soft no tenderness palpation or guarding, no ascites or distention MSK:  Neuro: Awake but sleepy.  Face symmetric, moves all extremities with generalized weakness, speech fluent Psych: Oriented to self, daughter, hospital.     Data Reviewed: I have personally reviewed following labs and imaging studies:  CBC: Recent Labs  Lab 04/12/21 1154 04/12/21 1526 04/13/21 0417 04/17/21 1559  WBC 9.3 10.1 10.8* 9.5  NEUTROABS 7.4* 8.4*  --   --   HGB 11.4* 10.8* 10.2* 9.6*  HCT 37.0* 33.0* 31.5* 30.2*  MCV 92 91.9 93.5 93.8  PLT 217 208 197 248   Basic Metabolic Panel: Recent Labs  Lab 04/12/21 1154 04/12/21 1526 04/13/21 0417 04/17/21 1559  NA 140 137  --  137  K 3.8 3.7  --  3.5  CL 98 100  --  101  CO2 27 28  --  31  GLUCOSE 152* 177*  --  170*  BUN 20 23  --  13  CREATININE 0.79 0.74 0.81 0.66  CALCIUM 8.6 8.7*  --  7.7*   GFR: Estimated Creatinine Clearance: 49.2 mL/min (by C-G formula based on SCr of 0.66 mg/dL). Liver Function Tests: Recent Labs  Lab 04/12/21 1154 04/12/21 1526  AST 22 23  ALT 11 13  ALKPHOS 80 61  BILITOT 1.3* 1.7*  PROT 6.5 6.9  ALBUMIN 3.3* 2.9*   No results for input(s): LIPASE, AMYLASE in the last 168 hours. No results for input(s): AMMONIA in the last 168 hours. Coagulation Profile: Recent Labs  Lab 04/12/21 1526 04/13/21 0417  INR 1.4* 1.5*   Cardiac Enzymes: No results for input(s): CKTOTAL, CKMB, CKMBINDEX, TROPONINI in the last 168 hours. BNP (last 3 results) No results for input(s): PROBNP in the last 8760 hours. HbA1C: No results for input(s): HGBA1C in the last 72 hours.  CBG: No results for input(s): GLUCAP in the last 168 hours. Lipid Profile: No results for input(s): CHOL, HDL, LDLCALC,  TRIG, CHOLHDL, LDLDIRECT in the last 72 hours.  Thyroid Function Tests: No results for input(s): TSH, T4TOTAL, FREET4, T3FREE, THYROIDAB in the last 72 hours. Anemia Panel: No results for input(s): VITAMINB12, FOLATE, FERRITIN, TIBC, IRON, RETICCTPCT in the last 72 hours.  Urine analysis:    Component Value Date/Time   COLORURINE AMBER (A) 04/13/2021 0058   APPEARANCEUR HAZY (A) 04/13/2021 0058   APPEARANCEUR Clear 04/12/2021 1149   LABSPEC 1.026 04/13/2021 0058   PHURINE 5.0 04/13/2021 0058   GLUCOSEU NEGATIVE 04/13/2021 0058   HGBUR NEGATIVE 04/13/2021 0058   BILIRUBINUR NEGATIVE 04/13/2021 0058   BILIRUBINUR Negative 04/12/2021 1149   KETONESUR NEGATIVE 04/13/2021 0058   PROTEINUR 30 (A) 04/13/2021 0058  NITRITE NEGATIVE 04/13/2021 0058   LEUKOCYTESUR NEGATIVE 04/13/2021 0058   Sepsis Labs: @LABRCNTIP (procalcitonin:4,lacticacidven:4)  ) Recent Results (from the past 240 hour(s))  Microscopic Examination     Status: Abnormal   Collection Time: 04/12/21 11:49 AM   BLD  Result Value Ref Range Status   WBC, UA None seen 0 - 5 /hpf Final   RBC 0-2 0 - 2 /hpf Final   Epithelial Cells (non renal) 0-10 0 - 10 /hpf Final   Mucus, UA Present (A) Not Estab. Final   Bacteria, UA None seen None seen/Few Final  Resp Panel by RT-PCR (Flu A&B, Covid) Nasopharyngeal Swab     Status: None   Collection Time: 04/12/21  3:26 PM   Specimen: Nasopharyngeal Swab; Nasopharyngeal(NP) swabs in vial transport medium  Result Value Ref Range Status   SARS Coronavirus 2 by RT PCR NEGATIVE NEGATIVE Final    Comment: (NOTE) SARS-CoV-2 target nucleic acids are NOT DETECTED.  The SARS-CoV-2 RNA is generally detectable in upper respiratory specimens during the acute phase of infection. The lowest concentration of SARS-CoV-2 viral copies this assay can detect is 138 copies/mL. A negative result does not preclude SARS-Cov-2 infection and should not be used as the sole basis for treatment or other  patient management decisions. A negative result may occur with  improper specimen collection/handling, submission of specimen other than nasopharyngeal swab, presence of viral mutation(s) within the areas targeted by this assay, and inadequate number of viral copies(<138 copies/mL). A negative result must be combined with clinical observations, patient history, and epidemiological information. The expected result is Negative.  Fact Sheet for Patients:  06/12/21  Fact Sheet for Healthcare Providers:  BloggerCourse.com  This test is no t yet approved or cleared by the [employer].[place] FDA and  has been authorized for detection and/or diagnosis of SARS-CoV-2 by FDA under an Emergency Use Authorization (EUA). This EUA will remain  in effect (meaning this test can be used) for the duration of the COVID-19 declaration under Section 564(b)(1) of the Act, 21 U.S.C.section 360bbb-3(b)(1), unless the authorization is terminated  or revoked sooner.       Influenza A by PCR NEGATIVE NEGATIVE Final   Influenza B by PCR NEGATIVE NEGATIVE Final    Comment: (NOTE) The Xpert Xpress SARS-CoV-2/FLU/RSV plus assay is intended as an aid in the diagnosis of influenza from Nasopharyngeal swab specimens and should not be used as a sole basis for treatment. Nasal washings and aspirates are unacceptable for Xpert Xpress SARS-CoV-2/FLU/RSV testing.  Fact Sheet for Patients: [place]  Fact Sheet for Healthcare Providers: BloggerCourse.com  This test is not yet approved or cleared by the [employer]ious[employer] FDA and has been authorized for detection and/or diagnosis of SARS-CoV-2 by FDA under an Emergency Use Authorization (EUA). This EUA will remain in effect (meaning this test can be used) for the duration of the COVID-19 declaration under Section 564(b)(1) of the Act, 21 U.S.C. section  360bbb-3(b)(1), unless the authorization is terminated or revoked.  Performed at [facility], [address] [place], [place]   Blood culture (single)     Status: None   Collection Time: 04/12/21  3:26 PM   Specimen: BLOOD  Result Value Ref Range Status   Specimen Description BLOOD BLOOD RIGHT FOREARM  Final   Special Requests   Final    BOTTLES DRAWN AEROBIC AND ANAEROBIC Blood Culture adequate volume   Culture   Final    NO GROWTH 5 DAYS Performed at [facility],  [address] [place], [place] [address]    Report Status 04/17/2021 FINAL  Final  Culture, blood (single)     Status: None (Preliminary result)   Collection Time: 04/13/21 12:06 AM   Specimen: BLOOD RIGHT FOREARM  Result Value Ref Range Status   Specimen Description BLOOD RIGHT FOREARM  Final   Special Requests   Final    BOTTLES DRAWN AEROBIC AND ANAEROBIC Blood Culture adequate volume   Culture   Final    NO GROWTH 4 DAYS Performed at [facility], [address], [place], [place] [address]    Report Status PENDING  Incomplete  Expectorated Sputum Assessment w Gram Stain, Rflx to Resp Cult     Status: None   Collection Time: 04/17/21  1:18 PM   Specimen: Sputum  Result Value Ref Range Status   Specimen Description SPUTUM  Final   Special Requests NONE  Final   Sputum evaluation   Final    Sputum specimen not acceptable for testing.  Please recollect.   Performed at [facility], [address], [place], [place] [address]    Report Status 04/17/2021 FINAL  Final         Radiology Studies: DG Chest 2 View  Result Date: 04/17/2021 CLINICAL DATA:  87 year old male with history of fever. EXAM: CHEST - 2 VIEW COMPARISON:  Chest x-ray 04/12/2021. FINDINGS: Patchy multifocal airspace consolidation and interstitial prominence noted throughout the right lung. Small right pleural effusion which may be partially loculated in the lower lateral right hemithorax.  Left lung appears clear. No left pleural effusion. Emphysematous changes are noted throughout the lungs. No pneumothorax. No evidence of pulmonary edema. Heart size is normal. Upper mediastinal contours are within normal limits. Atherosclerosis in the thoracic aorta. Left-sided pacemaker device in place with lead tips projecting over the expected location of the right atrium and right ventricle. IMPRESSION: 1. Worsening multilobar right-sided pneumonia with small right-sided parapneumonic pleural effusion which may be partially loculated laterally. 2. Emphysema. 3. Aortic atherosclerosis. Electronically Signed   By: Trudie Reed M.D.   On: 04/17/2021 08:01   CT CHEST WO CONTRAST  Result Date: 04/17/2021 CLINICAL DATA:  Fever, abnormal chest x-ray. EXAM: CT CHEST WITHOUT CONTRAST TECHNIQUE: Multidetector CT imaging of the chest was performed following the standard protocol without IV contrast. COMPARISON:  Radiograph same day. FINDINGS: Cardiovascular: Atherosclerosis of thoracic aorta is noted without aneurysm formation. Left-sided pacemaker is in grossly good position. Normal cardiac size. No pericardial effusion. Mediastinum/Nodes: No enlarged mediastinal or axillary lymph nodes. Thyroid gland, trachea, and esophagus demonstrate no significant findings. Lungs/Pleura: No pneumothorax is noted. Small right pleural effusion is noted. Airspace opacity is noted posteriorly in the right upper and lower lobes concerning for pneumonia. Some cystic formation is seen in the inflamed portion of lung which may represent some degree of necrosis or possibly emphysema. Minimal left posterior basilar subsegmental atelectasis is noted. Upper Abdomen: Cholelithiasis. Musculoskeletal: No chest wall mass or suspicious bone lesions identified. IMPRESSION: Large airspace opacity is noted posteriorly in the right upper and lower lobes concerning for pneumonia. Multiple cysts are seen in the inflamed portion of the right lung  which may represent necrosis or possibly underlying emphysema. Small right pleural effusion is noted. Minimal left posterior basilar subsegmental atelectasis is noted. Cholelithiasis. Aortic Atherosclerosis (ICD10-I70.0) and Emphysema (ICD10-J43.9). Electronically Signed   By: Lupita Raider M.D.   On: 04/17/2021 14:34        Scheduled Meds:  aspirin EC  81  mg Oral Daily   clopidogrel  75 mg Oral Daily   enoxaparin (LOVENOX) injection  40 mg Subcutaneous Q24H   gabapentin  600 mg Oral BID   metoprolol tartrate  25 mg Oral Daily   mirtazapine  7.5 mg Oral QHS   tamsulosin  0.4 mg Oral Daily   Continuous Infusions:  piperacillin-tazobactam (ZOSYN)  IV       LOS: 4 days    Time spent: 25 minutes    Alberteen Sam, MD [facility] 04/17/2021, 4:30 PM     Please page though AMION or Epic secure chat:  For password, contact charge nurse

## 2021-04-17 NOTE — Progress Notes (Signed)
Pharmacy Antibiotic Note  Jeffery Horne is a 87 y.o. male admitted on 04/12/2021 with PMH for DM, vascular disease, and HTN who presented with few days weakness and progressive decrease in functional level, then a severe choking episode, confusion and hypoxia and cough. Pt was previously on ceftriaxone and azithromycin. Pharmacy has been consulted for Zosyn dosing for PNA.  8/8 CXR: Worsening multilobar right-sided pneumonia with small right-sided parapneumonic pleural effusion 8/8 CT chest: Large airspace opacity is noted posteriorly in the right upper and lower lobes concerning for pneumonia  Plan: Zosyn 3.375g IV q8h (4 hour infusion). Monitor renal function and adjust dose as clinically indicated Follow up cultures   Height: 5\' 5"  (165.1 cm) Weight: 59 kg (130 lb 1.1 oz) IBW/kg (Calculated) : 61.5  Temp (24hrs), Avg:99.5 F (37.5 C), Min:98 F (36.7 C), Max:102.4 F (39.1 C)  Recent Labs  Lab 04/12/21 1154 04/12/21 1526 04/13/21 0417 04/17/21 1559  WBC 9.3 10.1 10.8* 9.5  CREATININE 0.79 0.74 0.81 0.66  LATICACIDVEN  --  1.3  --   --     Estimated Creatinine Clearance: 49.2 mL/min (by C-G formula based on SCr of 0.66 mg/dL).    No Known Allergies  Antimicrobials this admission: 8/4 Unasyn x1 8/4 Azithromycin x1, 8/6 >> 8/8 8/4 Ceftriaxone >> 8/8 8/8 Zosyn >>   Dose adjustments this admission:   Microbiology results: 8/4 BCx: NGTD  8/8 Sputum: Negative  8/8 MRSA PCR: sent  Thank you for allowing pharmacy to be a part of this patient's care.  10/4 Jeffery Horne 04/17/2021 4:25 PM

## 2021-04-17 NOTE — Progress Notes (Signed)
   04/17/21 2111  Assess: MEWS Score  Temp (!) 102.7 F (39.3 C)  BP (!) 151/66  Pulse Rate 65  Resp 20  SpO2 90 %  O2 Device Room Air  Assess: MEWS Score  MEWS Temp 2  MEWS Systolic 0  MEWS Pulse 0  MEWS RR 0  MEWS LOC 0  MEWS Score 2  MEWS Score Color Yellow  Assess: if the MEWS score is Yellow or Red  Were vital signs taken at a resting state? Yes  Focused Assessment Change from prior assessment (see assessment flowsheet)  Does the patient meet 2 or more of the SIRS criteria? No  Does the patient have a confirmed or suspected source of infection? Yes  Provider and Rapid Response Notified? Yes  MEWS guidelines implemented *See Row Information* Yes  Treat  MEWS Interventions Administered prn meds/treatments  Take Vital Signs  Increase Vital Sign Frequency  Yellow: Q 2hr X 2 then Q 4hr X 2, if remains yellow, continue Q 4hrs  Escalate  MEWS: Escalate Yellow: discuss with charge nurse/RN and consider discussing with provider and RRT  Notify: Charge Nurse/RN  Name of Charge Nurse/RN Notified Zettie Cooley, RN  Date Charge Nurse/RN Notified 04/17/21  Time Charge Nurse/RN Notified 2152  Notify: Provider  Provider Name/Title Lindajo Royal, MD  Date Provider Notified 04/17/21  Time Provider Notified 2130  Notification Type Page  Notification Reason Change in status  Provider response No new orders  Date of Provider Response 04/17/21  Time of Provider Response 2150  Assess: SIRS CRITERIA  SIRS Temperature  1  SIRS Pulse 0  SIRS Respirations  0  SIRS WBC 0  SIRS Score Sum  1

## 2021-04-17 NOTE — TOC Progression Note (Signed)
Transition of Care [facility]) - Progression Note    Patient Details  Name: Jeffery Horne MRN: [id number] Date of Birth: [birth year]  Transition of Care [facility]) CM/SW Contact  Caryn Section, RN Phone Number: 04/17/2021, 2:01 PM  Clinical Narrative:   Daughter accepted bed at [facility], Rickey aware.  Auth in progress, patient not medically ready for discharge.  TOC to follow.      Barriers to Discharge: Barriers Resolved  Expected Discharge Plan and Services           Expected Discharge Date: 04/15/21                                     Social Determinants of Health (SDOH) Interventions    Readmission Risk Interventions No flowsheet data found.

## 2021-04-17 NOTE — Progress Notes (Signed)
Occupational Therapy Treatment Patient Details Name: Jeffery Horne MRN: [id number] DOB: [birth year] Today's Date: 04/17/2021    History of present illness As per MD: Jeffery Horne is a 87 y.o. male with medical history significant for BPH, HTN, DM, PVD, resident of an assisted living facility who was sent to the ED for evaluation of generalized weakness and confusion that appeared to have started several hours following a choking episode at the facility.  Over the past several days prior to the choking episode patient, who at baseline can care for himself with little assistance, has been declining and had generalized weakness.  Following the episode, he gradually appeared more confused, more lethargic and they reported O2 levels down to 89% on room air.  History is limited due to lethargy but there have been no prior reports of vomiting or diarrhea, abdominal pain or complaints of chest pain or shortness of breath. Pt, No longer on O2 via [place] and is able ot maintain SPO2>93% at all time.   OT comments  Jeffery Horne presents today more debilitated and weaker than during OT session last week. Today he requires Mod-Max A for bed mobility and transfers, Max A for LB dressing, repeated encouragement to participate in session. He became SOB with short-distance transfer from bed to recliner, with O2 sats dropping to 87%. Given pt's increased weakness and decrease in fxl mobility and ability to perform ADLs, updating DC rec to SNF. At present, pt is a high fall risk and does not have the strength nor endurance to safely manage in an ALF.   Follow Up Recommendations  SNF    Equipment Recommendations  None recommended by OT    Recommendations for Other Services      Precautions / Restrictions Precautions Precautions: Fall Restrictions Weight Bearing Restrictions: No       Mobility Bed Mobility Overal bed mobility: Needs Assistance Bed Mobility: Supine to Sit     Supine to sit: Min assist      General bed mobility comments: extra time, encouragement to complete task; difficulty coming fully to EOB and getting both feet on floor    Transfers Overall transfer level: Needs assistance Equipment used: Rolling walker (2 wheeled) Transfers: Sit to/from [employer] Sit to Stand: Mod assist Stand pivot transfers: Max assist       General transfer comment: Mod A to power up into standing from bed at elevated height. Max A for pivoting feet to transfer to recliner. Labored effort, breathing with transfer.    Balance Overall balance assessment: Needs assistance Sitting-balance support: Bilateral upper extremity supported Sitting balance-Leahy Scale: Fair     Standing balance support: Bilateral upper extremity supported Standing balance-Leahy Scale: Poor Standing balance comment: Mod A for mantaining standing balance for short period of time                           ADL either performed or assessed with clinical judgement   ADL Overall ADL's : Needs assistance/impaired                                             Vision Baseline Vision/History: Wears glasses Patient Visual Report: No change from baseline     Perception     Praxis      Cognition Arousal/Alertness: Awake/alert Behavior During Therapy: Agitated Overall Cognitive Status:  Within Functional Limits for tasks assessed                                 General Comments: Pt agitated, angry, yelling t/o session. Dgtr present in room and reports that this is uncharacteristic behavior for pt.        Exercises Other Exercises Other Exercises: Transfers, mobility, dressing, cognitive reorientation. Discussion with pt's daughters re: POC, DC recs.   Shoulder Instructions       General Comments      Pertinent Vitals/ Pain       Pain Assessment: No/denies pain  Home Living                                          Prior  Functioning/Environment              Frequency  Min 1X/week        Progress Toward Goals  OT Goals(current goals can now be found in the care plan section)  Progress towards OT goals: Progressing toward goals  Acute Rehab OT Goals Patient Stated Goal: To "get out of here" OT Goal Formulation: With patient Time For Goal Achievement: 04/27/21 Potential to Achieve Goals: Good  Plan Discharge plan needs to be updated;Frequency remains appropriate    Co-evaluation                 AM-PAC OT "6 Clicks" Daily Activity     Outcome Measure   Help from another person eating meals?: A Little Help from another person taking care of personal grooming?: A Little Help from another person toileting, which includes using toliet, bedpan, or urinal?: A Lot Help from another person bathing (including washing, rinsing, drying)?: A Lot Help from another person to put on and taking off regular upper body clothing?: A Lot Help from another person to put on and taking off regular lower body clothing?: A Lot 6 Click Score: 14    End of Session Equipment Utilized During Treatment: Rolling walker  OT Visit Diagnosis: Unsteadiness on feet (R26.81);Muscle weakness (generalized) (M62.81);History of falling (Z91.81)   Activity Tolerance Patient tolerated treatment well;Patient limited by lethargy   Patient Left in chair;with call bell/phone within reach;with chair alarm set;with nursing/sitter in room;with family/visitor present   Nurse Communication          Time: 1191-4782 OT Time Calculation (min): 20 min  Charges: OT General Charges $OT Visit: 1 Visit OT Treatments $Self Care/Home Management : 8-22 mins  Jeffery Craver, PhD, MS, OTR/L 04/17/21, 12:58 PM

## 2021-04-18 DIAGNOSIS — J690 Pneumonitis due to inhalation of food and vomit: Secondary | ICD-10-CM | POA: Diagnosis not present

## 2021-04-18 LAB — CREATININE, SERUM
Creatinine, Ser: 0.67 mg/dL (ref 0.61–1.24)
GFR, Estimated: >60 mL/min (ref >60)

## 2021-04-18 LAB — CULTURE, BLOOD (SINGLE)
Culture: NO GROWTH
Special Requests: ADEQUATE

## 2021-04-18 MED ORDER — LINEZOLID 600 MG/300ML IV SOLN
600.0000 mg | Freq: Two times a day (BID) | INTRAVENOUS | Status: DC
  Administered 2021-04-18 – 2021-04-19 (×2): 600 mg via INTRAVENOUS
  Filled 2021-04-18 (×3): qty 300

## 2021-04-18 MED ORDER — PIPERACILLIN-TAZOBACTAM 3.375 G IVPB
3.3750 g | Freq: Three times a day (TID) | INTRAVENOUS | Status: AC
  Administered 2021-04-19 (×2): 3.375 g via INTRAVENOUS
  Filled 2021-04-18 (×2): qty 50

## 2021-04-18 NOTE — Progress Notes (Signed)
Physical Therapy Treatment Patient Details Name: Jeffery Horne MRN: [id number] DOB: 28-May-1927 Today's Date: 04/18/2021    History of Present Illness As per MD: [name] is a 87 y.o. male with medical history significant for BPH, HTN, DM, PVD, resident of an assisted living facility who was sent to the ED for evaluation of generalized weakness and confusion that appeared to have started several hours following a choking episode at the facility.  Over the past several days prior to the choking episode patient, who at baseline can care for himself with little assistance, has been declining and had generalized weakness.  Following the episode, he gradually appeared more confused, more lethargic and they reported O2 levels down to 89% on room air.  History is limited due to lethargy but there have been no prior reports of vomiting or diarrhea, abdominal pain or complaints of chest pain or shortness of breath. Pt, No longer on O2 via [place] and is able ot maintain SPO2>93% at all time.    PT Comments    Pt was alert throughout treatment with bouts of agitation when asked to participate. Pt required mod-A for supine > sit with HOB elevated. Pt is able to reach and hold onto bed rails and move LE to EOB. Pt requires physical assist for trunk support and scooting. The pt required multiple attempts of sit > stand requiring max-A and ultimately +2 physical assist. The patient is guarded and fearful of falling and unable to achieve full knee extension with BLE under BOS during static stance. +2 physical assist and safety, max-A for side-stepping bed > recliner. Pt quickly fatigues following activity and several rest breaks are necessary for patient participation. Skilled PT intervention is indicated to address deficits in function, mobility, and to return to PLOF as able.  Discharge recommendations are SNF.   Follow Up Recommendations  SNF;Supervision/Assistance - 24 hour     Equipment Recommendations   Other (comment) (TBD next venue of care)    Recommendations for Other Services       Precautions / Restrictions Precautions Precautions: Fall Restrictions Weight Bearing Restrictions: No    Mobility  Bed Mobility Overal bed mobility: Needs Assistance Bed Mobility: Supine to Sit     Supine to sit: Mod assist     General bed mobility comments: Cues for reaching and utilizing bed rails; challenged with scooting forward and fearful of falling when physical assist is provided    Transfers Overall transfer level: Needs assistance Equipment used: 2 person hand held assist Transfers: Sit to/from Stand Sit to Stand: Max assist;+2 physical assistance         General transfer comment: Pt heavily relies on bed for posterior support and becomes fearful during transfer transition. Bed height was elevated to minimize energy expenditure. Pt was uanble to achieve full knee extension or maintain LE under BOS.  Ambulation/Gait Ambulation/Gait assistance: Max assist;+2 physical assistance Gait Distance (Feet): 2 Feet   Gait Pattern/deviations: Decreased step length - right;Decreased step length - left     General Gait Details: Bed > recliner with small steps, max-A +2 for physical assist and safety.   Stairs             Wheelchair Mobility    Modified Rankin (Stroke Patients Only)       Balance Overall balance assessment: Needs assistance Sitting-balance support: Bilateral upper extremity supported;Feet supported Sitting balance-Leahy Scale: Fair Sitting balance - Comments: Pt requires BUE for support particuarly holding onto bed rails. Cues are necessary  due to heavy posterior lean. Postural control: Posterior lean Standing balance support: Bilateral upper extremity supported Standing balance-Leahy Scale: Poor Standing balance comment: Mod to max-A for maintaining static balance                            Cognition Arousal/Alertness:  Awake/alert Behavior During Therapy: Agitated Overall Cognitive Status: Within Functional Limits for tasks assessed                                 General Comments: Pt is slightly agitated during session participation but follows 1-step commands with increased time for processing.      Exercises General Exercises - Lower Extremity Long Arc Quad: AROM;Strengthening;Both;Seated;10 reps Other Exercises Other Exercises: Sit <> stand x 2 w/ max-A, RW. Pt relies heavily on bed for posterior LE support during static stance    General Comments        Pertinent Vitals/Pain Pain Assessment: Faces Faces Pain Scale: Hurts a little bit Pain Location: back Pain Descriptors / Indicators: Grimacing Pain Intervention(s): Limited activity within patient's tolerance;Monitored during session;Repositioned    Home Living                      Prior Function            PT Goals (current goals can now be found in the care plan section) Acute Rehab PT Goals Patient Stated Goal: To "get out of here" PT Goal Formulation: With patient/family Potential to Achieve Goals: Fair Progress towards PT goals: Progressing toward goals    Frequency    Min 2X/week      PT Plan Current plan remains appropriate    Co-evaluation              AM-PAC PT "6 Clicks" Mobility   Outcome Measure  Help needed turning from your back to your side while in a flat bed without using bedrails?: A Little Help needed moving from lying on your back to sitting on the side of a flat bed without using bedrails?: A Lot Help needed moving to and from a bed to a chair (including a wheelchair)?: A Lot Help needed standing up from a chair using your arms (e.g., wheelchair or bedside chair)?: A Lot Help needed to walk in hospital room?: A Lot Help needed climbing 3-5 steps with a railing? : Total 6 Click Score: 12    End of Session Equipment Utilized During Treatment: Gait belt Activity  Tolerance: Patient limited by fatigue Patient left: in chair;with call bell/phone within reach;with chair alarm set;with family/visitor present Nurse Communication: Mobility status PT Visit Diagnosis: Unsteadiness on feet (R26.81);Muscle weakness (generalized) (M62.81);Difficulty in walking, not elsewhere classified (R26.2)     Time: 1132-1204 PT Time Calculation (min) (ACUTE ONLY): 32 min  Charges:                        [employer], SPT

## 2021-04-18 NOTE — TOC Progression Note (Signed)
Transition of Care [facility]) - Progression Note    Patient Details  Name: Jeffery Horne MRN: [id number] Date of Birth: Sep 07, 1927  [facility] of [facility]) CM/SW Contact  Caryn Section, RN Phone Number: 04/18/2021, 3:08 PM  Clinical Narrative:   [facility] is aware that patient is not medically ready to discharge today.  Patient's condition has changed as reported by the medical team.  MD will speak to family to assess future disposition and relay information to case manager.  TOC to follow to discharge.       Barriers to Discharge: Barriers Resolved  Expected Discharge Plan and Services           Expected Discharge Date: 04/15/21                                     Social Determinants of Health (SDOH) Interventions    Readmission Risk Interventions No flowsheet data found.

## 2021-04-18 NOTE — Progress Notes (Signed)
PT Cancellation Note  Patient Details Name: Jeffery Horne MRN: [id number] DOB: 01/08/1927   Cancelled Treatment:    Reason Eval/Treat Not Completed: Other (comment). Pt eating breakfast at this time, PT to re attempt as able.  Olga Coaster PT, DPT 8:49 AM,04/18/21

## 2021-04-18 NOTE — Progress Notes (Signed)
PROGRESS NOTE    [name]  WUJ:[id number] DOB: [birth year] DOA: 04/12/2021 PCP: Marjie Skiff, NP      Brief Narrative:  [name] is a 87 y.o. M with DM, vascular disease, and HTN who presented with few days weakness and progressive decrease in functional level, then a severe choking episode, confusion and hypoxia and cough.  At baseline the patient is able to transfer, do self cares, but over the last several days he has gotten weaker and weaker until he is unable to get up without assistance.  Then today he developed a fever, had a prolonged severe choking episode, and appeared confused so 911 was called.  In the ER, CXR showed pneumonia.         Assessment & Plan:  Community-acquired pneumonia, right lower lobe Possible aspiration pneumonia Sepsis ruled out, no significant systemic response or organ failure noted.  Admitted and started on Rocephin/azithromycin.  Some initial improvement.  SLP consulted, patient swallow appeared normal.  After initial improvement in fever curve, patient became weaker, then started spiking worse fevers, and having more frequent encephalopathy.  CT chest obtained 8/8 that showed considerable consolidation gaseous areas, possibly some necrotic pneumonia.  Febrile again last night.  Discussed with radiology and interventional radiology.  There may be a small to moderate pleural effusion, but treatment of this would not improve the patient's oxygenation or breathing status, and at present there is no reason to think it would improve diagnostic yield given sputum cultures pending.  Given the risk of pneumothorax, which would likely be a life ending complication for this patient I will defer for now  - Continue linezolid coverage of possible MRSA pneumonia  -Continue Zosyn for coverage of possible anaerobic/aspiration pneumonia  - Follow sputum culture  - Continue aggressive pulmonary toilet  - Physical therapy   -Consult palliative  care    In a best case, I hope that his culture will not grow drug resistant organisms, we can attribute his infection to anaerobes and he will improve with narrowing to Unasyn  In a worse case I am afraid that he will develop a cavitary pneumonia, or a lung abscess that would require a prolonged course of many weeks of antibiotics, and that his frail and debilitated state would likely not be something he could survive.  He is not a candidate for VATS in the case of an empyema or loculated effusion.       Hypertension, cardiovascular disease BP normal - Continue aspirin, Plavix, metoprolol  Polyneuropathy - Continue gabapentin  BPH - Continue Flomax  Mild dementia Lives in ALF - Continue mirtazapine     Resolved or inactive issues: Anemia   Respiratory failure ruled out  Acute metabolic encephalopathy Hospital delirium At baseline the patient is oriented to person, place, and time.    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  He has improved and is ready for discharge from a medical standpoint, although he is considerably debilitated, requiring assist to stand, and unsteady ambulating more than a few feet.     Dispo: The patient is from: SNF              Anticipated d/c is to: SNF              Patient currently is not medically  stable to d/c.   Difficult to place patient No              MDM: The below labs and imaging reports reviewed and summarized above.  Medication management as above.   DVT prophylaxis: enoxaparin (LOVENOX) injection 40 mg Start: 04/13/21 0800  Code Status: DNR Family Communication: Daughter at bedside         Subjective: Another fever.  At times still confused.  Sputum production seems slightly less today.   Still extremely weak and debilitated.           Objective: Vitals:   04/18/21 0021 04/18/21 0504 04/18/21 0820 04/18/21 1208  BP: 117/67 (!) 151/63 (!) 160/64 110/76  Pulse: 61 77 69 72  Resp: 18 18 15 17   Temp: 99.2 F (37.3 C) 98.7 F (37.1 C) (!) 97.5 F (36.4 C) 98.2 F (36.8 C)  TempSrc: Oral Oral Oral Oral  SpO2: 90% 93% (!) 89% 90%  Weight:      Height:        Intake/Output Summary (Last 24 hours) at 04/18/2021 1728 Last data filed at 04/18/2021 1300 Gross per 24 hour  Intake 1410.94 ml  Output --  Net 1410.94 ml   Filed Weights   04/12/21 1523 04/13/21 0306  Weight: 59 kg 59 kg    Examination: General appearance: Frail elderly male, sleeping, arouses slightly.  Appears somewhat debilitated.     HEENT: Anicteric, conjunctival pink, lids lashes normal.  No nasal deformity, discharge, or epistaxis.  Oropharynx moist, no oral lesions, dentures in place.  Hearing diminished.   Skin:  Cardiac: Regular rate and rhythm, no murmurs, no lower extremity edema, no JVD Respiratory: Normal respiratory rate and rhythm, no wheezing, breath sounds diminished on the right, no rales Abdomen: Abdomen soft no tenderness palpation or guarding, no ascites or distention MSK:  Neuro: Awake but sleepy.  Face symmetric, moves all extremities with generalized weakness, speech fluent Psych: Oriented to self, daughter, hospital.     Data Reviewed: I have personally reviewed following labs and imaging studies:  CBC: Recent Labs  Lab 04/12/21 1154 04/12/21 1526 04/13/21 0417 04/17/21 1559  WBC 9.3 10.1 10.8* 9.5  NEUTROABS 7.4* 8.4*  --   --   HGB 11.4* 10.8* 10.2* 9.6*  HCT 37.0* 33.0* 31.5* 30.2*  MCV 92 91.9 93.5 93.8  PLT 217 208 197 248   Basic Metabolic Panel: Recent Labs  Lab 04/12/21 1154 04/12/21 1526 04/13/21 0417 04/17/21 1559 04/18/21 0616  NA 140 137  --  137  --   K 3.8 3.7  --  3.5  --   CL 98 100  --  101  --   CO2 27 28  --  31  --   GLUCOSE 152*  177*  --  170*  --   BUN 20 23  --  13  --   CREATININE 0.79 0.74 0.81 0.66 0.67  CALCIUM 8.6 8.7*  --  7.7*  --    GFR: Estimated Creatinine Clearance: 49.2 mL/min (by C-G formula based on SCr of 0.67 mg/dL). Liver Function Tests: Recent Labs  Lab 04/12/21 1154 04/12/21 1526  AST 22 23  ALT 11 13  ALKPHOS 80 61  BILITOT 1.3* 1.7*  PROT 6.5 6.9  ALBUMIN 3.3* 2.9*   No results for input(s): LIPASE, AMYLASE in the last 168 hours. No results for input(s): AMMONIA in the last 168 hours. Coagulation Profile: Recent Labs  Lab 04/12/21 1526 04/13/21 0417  INR 1.4* 1.5*  Cardiac Enzymes: No results for input(s): CKTOTAL, CKMB, CKMBINDEX, TROPONINI in the last 168 hours. BNP (last 3 results) No results for input(s): PROBNP in the last 8760 hours. HbA1C: No results for input(s): HGBA1C in the last 72 hours.  CBG: No results for input(s): GLUCAP in the last 168 hours. Lipid Profile: No results for input(s): CHOL, HDL, LDLCALC, TRIG, CHOLHDL, LDLDIRECT in the last 72 hours.  Thyroid Function Tests: No results for input(s): TSH, T4TOTAL, FREET4, T3FREE, THYROIDAB in the last 72 hours. Anemia Panel: No results for input(s): VITAMINB12, FOLATE, FERRITIN, TIBC, IRON, RETICCTPCT in the last 72 hours.  Urine analysis:    Component Value Date/Time   COLORURINE AMBER (A) 04/13/2021 0058   APPEARANCEUR HAZY (A) 04/13/2021 0058   APPEARANCEUR Clear 04/12/2021 1149   LABSPEC 1.026 04/13/2021 0058   PHURINE 5.0 04/13/2021 0058   GLUCOSEU NEGATIVE 04/13/2021 0058   HGBUR NEGATIVE 04/13/2021 0058   BILIRUBINUR NEGATIVE 04/13/2021 0058   BILIRUBINUR Negative 04/12/2021 1149   KETONESUR NEGATIVE 04/13/2021 0058   PROTEINUR 30 (A) 04/13/2021 0058   NITRITE NEGATIVE 04/13/2021 0058   LEUKOCYTESUR NEGATIVE 04/13/2021 0058   Sepsis Labs: (procalcitonin:4,lacticacidven:4)  ) Recent Results (from the past 240 hour(s))  Microscopic Examination     Status: Abnormal    Collection Time: 04/12/21 11:49 AM   BLD  Result Value Ref Range Status   WBC, UA None seen 0 - 5 /hpf Final   RBC 0-2 0 - 2 /hpf Final   Epithelial Cells (non renal) 0-10 0 - 10 /hpf Final   Mucus, UA Present (A) Not Estab. Final   Bacteria, UA None seen None seen/Few Final  Resp Panel by RT-PCR (Flu A&B, Covid) Nasopharyngeal Swab     Status: None   Collection Time: 04/12/21  3:26 PM   Specimen: Nasopharyngeal Swab; Nasopharyngeal(NP) swabs in vial transport medium  Result Value Ref Range Status   SARS Coronavirus 2 by RT PCR NEGATIVE NEGATIVE Final    Comment: (NOTE) SARS-CoV-2 target nucleic acids are NOT DETECTED.  The SARS-CoV-2 RNA is generally detectable in upper respiratory specimens during the acute phase of infection. The lowest concentration of SARS-CoV-2 viral copies this assay can detect is 138 copies/mL. A negative result does not preclude SARS-Cov-2 infection and should not be used as the sole basis for treatment or other patient management decisions. A negative result may occur with  improper specimen collection/handling, submission of specimen other than nasopharyngeal swab, presence of viral mutation(s) within the areas targeted by this assay, and inadequate number of viral copies(<138 copies/mL). A negative result must be combined with clinical observations, patient history, and epidemiological information. The expected result is Negative.  Fact Sheet for Patients:  BloggerCourse.com  Fact Sheet for Healthcare Providers:  SeriousBroker.it  This test is no t yet approved or cleared by the [place] FDA and  has been authorized for detection and/or diagnosis of SARS-CoV-2 by FDA under an Emergency Use Authorization (EUA). This EUA will remain  in effect (meaning this test can be used) for the duration of the COVID-19 declaration under Section 564(b)(1) of the Act, 21 U.S.C.section 360bbb-3(b)(1), unless the  authorization is terminated  or revoked sooner.       Influenza A by PCR NEGATIVE NEGATIVE Final   Influenza B by PCR NEGATIVE NEGATIVE Final    Comment: (NOTE) The Xpert Xpress SARS-CoV-2/FLU/RSV plus assay is intended as an aid in the diagnosis of influenza from Nasopharyngeal swab specimens and should not be used as a sole  basis for treatment. Nasal washings and aspirates are unacceptable for Xpert Xpress SARS-CoV-2/FLU/RSV testing.  Fact Sheet for Patients: BloggerCourse.comhttps://www.fda.gov/media/152166/download  Fact Sheet for Healthcare Providers: SeriousBroker.ithttps://www.fda.gov/media/152162/download  This test is not yet approved or cleared by the [place] FDA and has been authorized for detection and/or diagnosis of SARS-CoV-2 by FDA under an Emergency Use Authorization (EUA). This EUA will remain in effect (meaning this test can be used) for the duration of the COVID-19 declaration under Section 564(b)(1) of the Act, 21 U.S.C. section 360bbb-3(b)(1), unless the authorization is terminated or revoked.  Performed at [facility], [address] [place], [place] [address]   Blood culture (single)     Status: None   Collection Time: 04/12/21  3:26 PM   Specimen: BLOOD  Result Value Ref Range Status   Specimen Description BLOOD BLOOD RIGHT FOREARM  Final   Special Requests   Final    BOTTLES DRAWN AEROBIC AND ANAEROBIC Blood Culture adequate volume   Culture   Final    NO GROWTH 5 DAYS Performed at [facility], [address] [place], [place] [address]    Report Status 04/17/2021 FINAL  Final  Culture, blood (single)     Status: None   Collection Time: 04/13/21 12:06 AM   Specimen: BLOOD RIGHT FOREARM  Result Value Ref Range Status   Specimen Description BLOOD RIGHT FOREARM  Final   Special Requests   Final    BOTTLES DRAWN AEROBIC AND ANAEROBIC Blood Culture adequate volume   Culture   Final    NO GROWTH 5 DAYS Performed at [facility], [address] [place], [place] [address]    Report Status 04/18/2021 FINAL  Final  Expectorated Sputum Assessment w Gram Stain, Rflx to Resp Cult     Status: None   Collection Time: 04/17/21  1:18 PM   Specimen: Sputum  Result Value Ref Range Status   Specimen Description SPUTUM  Final   Special Requests NONE  Final   Sputum evaluation   Final    Sputum specimen not acceptable for testing.  Please recollect.   Performed at [facility], [address] [place], [place] [address]    Report Status 04/17/2021 FINAL  Final  Expectorated Sputum Assessment w Gram Stain, Rflx to Resp Cult     Status: None   Collection Time: 04/17/21  3:15 PM   Specimen: Sputum  Result Value Ref Range Status   Specimen Description SPU  Final   Special Requests NONE  Final   Sputum evaluation   Final    THIS SPECIMEN IS ACCEPTABLE FOR SPUTUM CULTURE Performed at [facility], [address] [place], [place] [address]    Report Status 04/17/2021 FINAL  Final  Culture, Respiratory w Gram Stain     Status: None (Preliminary result)   Collection Time: 04/17/21  3:15 PM   Specimen: Sputum  Result Value Ref Range Status   Specimen Description   Final    SPU Performed at [facility], [address] [place], [place] [address]    Special Requests   Final    NONE Reflexed from [id number] [id number] Performed at [facility], [address] [place], [place] [address]    Gram Stain   Final    ABUNDANT SQUAMOUS EPITHELIAL CELLS PRESENT RARE WBC PRESENT, PREDOMINANTLY PMN RARE YEAST    Culture   Final    TOO YOUNG TO READ Performed at [facility], [address] [place],  [place] [address]    Report Status PENDING  Incomplete  MRSA Next Gen by PCR, Nasal     Status: Abnormal   Collection Time: 04/17/21  5:14 PM   Specimen: Nasal Mucosa; Nasal Swab  Result Value Ref Range Status   MRSA by PCR Next Gen DETECTED (A) NOT DETECTED Final    Comment: RESULT CALLED TO, READ BACK  BY AND VERIFIED WITH: KRISTYN DAVIS ON 04/17/21  @1906  SKL (NOTE) The GeneXpert MRSA Assay (FDA approved for NASAL specimens only), is one component of a comprehensive MRSA colonization surveillance program. It is not intended to diagnose MRSA infection nor to guide or monitor treatment for MRSA infections. Test performance is not FDA approved in patients less than 44 years old. Performed at [facility], [address] [place], [place]          Radiology Studies: DG Chest 2 View  Result Date: 04/17/2021 CLINICAL DATA:  87 year old male with history of fever. EXAM: CHEST - 2 VIEW COMPARISON:  Chest x-ray 04/12/2021. FINDINGS: Patchy multifocal airspace consolidation and interstitial prominence noted throughout the right lung. Small right pleural effusion which may be partially loculated in the lower lateral right hemithorax. Left lung appears clear. No left pleural effusion. Emphysematous changes are noted throughout the lungs. No pneumothorax. No evidence of pulmonary edema. Heart size is normal. Upper mediastinal contours are within normal limits. Atherosclerosis in the thoracic aorta. Left-sided pacemaker device in place with lead tips projecting over the expected location of the right atrium and right ventricle. IMPRESSION: 1. Worsening multilobar right-sided pneumonia with small right-sided parapneumonic pleural effusion which may be partially loculated laterally. 2. Emphysema. 3. Aortic atherosclerosis. Electronically Signed   By: 06/12/2021 M.D.   On: 04/17/2021 08:01   CT CHEST WO CONTRAST  Result Date: 04/17/2021 CLINICAL DATA:  Fever, abnormal chest x-ray. EXAM: CT CHEST WITHOUT CONTRAST TECHNIQUE: Multidetector CT imaging of the chest was performed following the standard protocol without IV contrast. COMPARISON:  Radiograph same day. FINDINGS: Cardiovascular: Atherosclerosis of thoracic aorta is noted without aneurysm formation. Left-sided pacemaker is in  grossly good position. Normal cardiac size. No pericardial effusion. Mediastinum/Nodes: No enlarged mediastinal or axillary lymph nodes. Thyroid gland, trachea, and esophagus demonstrate no significant findings. Lungs/Pleura: No pneumothorax is noted. Small right pleural effusion is noted. Airspace opacity is noted posteriorly in the right upper and lower lobes concerning for pneumonia. Some cystic formation is seen in the inflamed portion of lung which may represent some degree of necrosis or possibly emphysema. Minimal left posterior basilar subsegmental atelectasis is noted. Upper Abdomen: Cholelithiasis. Musculoskeletal: No chest wall mass or suspicious bone lesions identified. IMPRESSION: Large airspace opacity is noted posteriorly in the right upper and lower lobes concerning for pneumonia. Multiple cysts are seen in the inflamed portion of the right lung which may represent necrosis or possibly underlying emphysema. Small right pleural effusion is noted. Minimal left posterior basilar subsegmental atelectasis is noted. Cholelithiasis. Aortic Atherosclerosis (ICD10-I70.0) and Emphysema (ICD10-J43.9). Electronically Signed   By: 06/17/2021 M.D.   On: 04/17/2021 14:34        Scheduled Meds:  aspirin EC  81 mg Oral Daily   clopidogrel  75 mg Oral Daily   enoxaparin (LOVENOX) injection  40 mg Subcutaneous Q24H   gabapentin  600 mg Oral BID   metoprolol tartrate  25 mg Oral Daily   mirtazapine  7.5 mg Oral QHS   tamsulosin  0.4 mg Oral Daily   Continuous Infusions:  linezolid (ZYVOX) IV     [START ON 04/19/2021] piperacillin-tazobactam (ZOSYN)  IV       LOS: 5 days    Time spent: 35 minutes    Alberteen Sam, MD [facility] 04/18/2021, 5:28 PM     Please page though AMION or Epic secure chat:  For password, contact charge nurse

## 2021-04-18 NOTE — Progress Notes (Signed)
   04/16/21 2048  Assess: MEWS Score  Temp (!) 102.4 F (39.1 C)  BP 126/81  Pulse Rate 66  Resp (!) 21  SpO2 94 %  O2 Device Room Air  Assess: MEWS Score  MEWS Temp 2  MEWS Systolic 0  MEWS Pulse 0  MEWS RR 1  MEWS LOC 0  MEWS Score 3  MEWS Score Color Yellow  Assess: if the MEWS score is Yellow or Red  Were vital signs taken at a resting state? Yes  Focused Assessment Change from prior assessment (see assessment flowsheet)  Does the patient meet 2 or more of the SIRS criteria? Yes (Simultaneous filing. User may not have seen previous data.)  Does the patient have a confirmed or suspected source of infection? Yes  Provider and Rapid Response Notified? Yes  MEWS guidelines implemented *See Row Information* Yes  Treat  MEWS Interventions Escalated (See documentation below)  Pain Scale 0-10  Pain Score 0  Take Vital Signs  Increase Vital Sign Frequency  Yellow: Q 2hr X 2 then Q 4hr X 2, if remains yellow, continue Q 4hrs  Escalate  MEWS: Escalate Yellow: discuss with charge nurse/RN and consider discussing with provider and RRT  Notify: Charge Nurse/RN  Name of Charge Nurse/RN Notified Irving Copas RN  Date Charge Nurse/RN Notified 04/16/21  Time Charge Nurse/RN Notified 2100  Notify: Provider  Provider Name/Title Doylene Canard, MD  Date Provider Notified 04/16/21  Time Provider Notified 2123  Notification Type Page  Notification Reason Change in status  Notify: Rapid Response  Name of Rapid Response RN Notified ICU Charge Nurse, Desma Mcgregor  Date Rapid Response Notified 04/16/21  Time Rapid Response Notified 2100  Document  Patient Outcome Other (Comment) (comtinue to monitor)  Assess: SIRS CRITERIA  SIRS Temperature  1  SIRS Pulse 0  SIRS Respirations  1  SIRS WBC 0  SIRS Score Sum  2  Inserted for Jake Michaelis RN

## 2021-04-19 DIAGNOSIS — E1159 Type 2 diabetes mellitus with other circulatory complications: Secondary | ICD-10-CM

## 2021-04-19 DIAGNOSIS — Z515 Encounter for palliative care: Secondary | ICD-10-CM

## 2021-04-19 DIAGNOSIS — J850 Gangrene and necrosis of lung: Secondary | ICD-10-CM

## 2021-04-19 DIAGNOSIS — E1149 Type 2 diabetes mellitus with other diabetic neurological complication: Secondary | ICD-10-CM

## 2021-04-19 DIAGNOSIS — J690 Pneumonitis due to inhalation of food and vomit: Principal | ICD-10-CM

## 2021-04-19 DIAGNOSIS — Z66 Do not resuscitate: Secondary | ICD-10-CM | POA: Diagnosis not present

## 2021-04-19 DIAGNOSIS — Z7189 Other specified counseling: Secondary | ICD-10-CM | POA: Diagnosis not present

## 2021-04-19 DIAGNOSIS — R531 Weakness: Secondary | ICD-10-CM | POA: Diagnosis not present

## 2021-04-19 DIAGNOSIS — I152 Hypertension secondary to endocrine disorders: Secondary | ICD-10-CM

## 2021-04-19 LAB — BASIC METABOLIC PANEL
Anion gap: 9 (ref 5–15)
BUN: 14 mg/dL (ref 8–23)
CO2: 31 mmol/L (ref 22–32)
Calcium: 8.1 mg/dL — ABNORMAL LOW (ref 8.9–10.3)
Chloride: 98 mmol/L (ref 98–111)
Creatinine, Ser: 0.55 mg/dL — ABNORMAL LOW (ref 0.61–1.24)
GFR, Estimated: >60 mL/min (ref >60)
Glucose, Bld: 130 mg/dL — ABNORMAL HIGH (ref 70–99)
Potassium: 3.3 mmol/L — ABNORMAL LOW (ref 3.5–5.1)
Sodium: 138 mmol/L (ref 135–145)

## 2021-04-19 LAB — CBC
HCT: 32.2 % — ABNORMAL LOW (ref 39.0–52.0)
Hemoglobin: 10.2 g/dL — ABNORMAL LOW (ref 13.0–17.0)
MCH: 28.9 pg (ref 26.0–34.0)
MCHC: 31.7 g/dL (ref 30.0–36.0)
MCV: 91.2 fL (ref 80.0–100.0)
Platelets: 314 10*3/uL (ref 150–400)
RBC: 3.53 MIL/uL — ABNORMAL LOW (ref 4.22–5.81)
RDW: 14.7 % (ref 11.5–15.5)
WBC: 12.2 10*3/uL — ABNORMAL HIGH (ref 4.0–10.5)
nRBC: 0 % (ref 0.0–0.2)

## 2021-04-19 LAB — MAGNESIUM: Magnesium: 1.6 mg/dL — ABNORMAL LOW (ref 1.7–2.4)

## 2021-04-19 MED ORDER — SODIUM CHLORIDE 0.9 % IV SOLN
3.0000 g | Freq: Four times a day (QID) | INTRAVENOUS | Status: DC
  Administered 2021-04-20 – 2021-04-24 (×19): 3 g via INTRAVENOUS
  Filled 2021-04-19: qty 8
  Filled 2021-04-19 (×2): qty 3
  Filled 2021-04-19: qty 8
  Filled 2021-04-19 (×4): qty 3
  Filled 2021-04-19 (×2): qty 8
  Filled 2021-04-19: qty 3
  Filled 2021-04-19 (×2): qty 8
  Filled 2021-04-19: qty 3
  Filled 2021-04-19 (×3): qty 8
  Filled 2021-04-19: qty 3
  Filled 2021-04-19: qty 8
  Filled 2021-04-19: qty 3
  Filled 2021-04-19 (×2): qty 8

## 2021-04-19 MED ORDER — POTASSIUM CHLORIDE CRYS ER 20 MEQ PO TBCR
40.0000 meq | EXTENDED_RELEASE_TABLET | Freq: Once | ORAL | Status: AC
  Administered 2021-04-19: 40 meq via ORAL
  Filled 2021-04-19: qty 2

## 2021-04-19 MED ORDER — MAGNESIUM SULFATE 2 GM/50ML IV SOLN
2.0000 g | Freq: Once | INTRAVENOUS | Status: AC
  Administered 2021-04-19: 2 g via INTRAVENOUS
  Filled 2021-04-19: qty 50

## 2021-04-19 MED ORDER — LINEZOLID 600 MG PO TABS
600.0000 mg | ORAL_TABLET | Freq: Two times a day (BID) | ORAL | Status: DC
  Administered 2021-04-19 – 2021-04-25 (×12): 600 mg via ORAL
  Filled 2021-04-19 (×13): qty 1

## 2021-04-19 NOTE — Progress Notes (Addendum)
PROGRESS NOTE    Jeffery Horne  OZH:[id number] DOB: 10-Feb-1927 DOA: 04/12/2021 PCP: Marjie Skiff, NP   Brief Narrative: Taken from prior notes. Jeffery Horne is a 87 y.o. M with DM, vascular disease, and HTN who presented with few days weakness and progressive decrease in functional level, then a severe choking episode, confusion and hypoxia and cough.   At baseline the patient is able to transfer, do self cares, but over the last several days he has gotten weaker and weaker until he is unable to get up without assistance.  Then on the day of admission he developed a fever, had a prolonged severe choking episode, and appeared confused so 911 was called.  In the ER, CXR showed pneumonia.  Sepsis ruled out as there was no significant systemic response or organ failure noted.  Initially treated with Rocephin and a Zithromax. Continued to have fever, repeat CT chest on 04/17/2021 with some concern of necrotizing pneumonia and worsening of emphysema.  MRSA swab positive.  Currently on linezolid and Zosyn.  Discussed with radiology and interventional radiology.  There may be a small to moderate pleural effusion, but treatment of this would not improve the patient's oxygenation or breathing status, and at present there is no reason to think it would improve diagnostic yield given sputum cultures pending.  Given the risk of pneumothorax, which would likely be a life ending complication for this patient procedure was deferred.  Infectious disease was consulted on 04/19/2021 as continued to spike fever.  Sputum cultures still pending.  Patient is very high risk for deterioration and death based on his age, current illness and prior comorbidities.  Palliative care was also consulted.  Subjective: Patient was seen and examined today.  He was quite frustrated that he is not feeling well.  Remained weak, continues to have some cough.  Appetite with some improvement.  He was also frustrated after seeing a new  face today.  Explained to him that we work in 7 days shifts and new shift starts from Wednesday.  Daughter at bedside.  Assessment & Plan:   Principal Problem:   Aspiration pneumonia (HCC) Active Problems:   Hypertension associated with diabetes (HCC)   Type II diabetes mellitus with neurological manifestations (HCC)   Sepsis (HCC)   Generalized weakness  Right lower lobe possible aspiration pneumonia.  Initially treated with Rocephin and Zithromax and later switched to linezolid and Zosyn with poor response. Swallowed he was also consulted and apparently low risk for aspiration. Continues to spike fever, up to 101.9 over the past 24-hour. CT chest done on 04/17/2021 with considerable consolidation, gaseous areas, possibly some necrotic pneumonia and worsening of emphysema.  MRSA PCR positive. Patient is not a candidate for VATS in case of an prior pneumonia or loculated effusion. -Continue with linezolid and Zosyn -ID consult -Follow-up sputum cultures -Continue with aggressive pulmonary toilet -Continue with PT  Hypokalemia and hypomagnesemia.  Potassium at 3.3 with magnesium of 1.6. -Replete electrolytes and monitor  Hypertension, cardiovascular disease BP normal - Continue aspirin, Plavix, metoprolol  Polyneuropathy - Continue gabapentin  BPH - Continue Flomax   Mild dementia Lives in ALF - Continue mirtazapine  Diet controlled type 2 diabetes with polyneuropathy Glucose normal here  Acute metabolic encephalopathy Hospital delirium At baseline the patient is oriented to person, place, and time.   At present, he has some continued waxing and waning inattention and disorientation   Objective: Vitals:   04/18/21 2300 04/19/21 0502 04/19/21 0733 04/19/21 1236  BP: (!) 118/48 131/77 (!) 151/98 (!) 114/56  Pulse: 69 71 66 66  Resp: 14 16 18 20   Temp: 98.2 F (36.8 C) 98.5 F (36.9 C) 98.7 F (37.1 C) 98.3 F (36.8 C)  TempSrc: Oral     SpO2: 91% 94% 96% 92%   Weight:      Height:        Intake/Output Summary (Last 24 hours) at 04/19/2021 1414 Last data filed at 04/19/2021 1236 Gross per 24 hour  Intake 240 ml  Output 325 ml  Net -85 ml   Filed Weights   04/12/21 1523 04/13/21 0306  Weight: 59 kg 59 kg    Examination:  General exam: Frail and malnourished elderly man, appears calm and comfortable  Respiratory system: Decreased breath sound at right base, respiratory effort normal. Cardiovascular system: S1 & S2 heard, RRR.  Gastrointestinal system: Soft, nontender, nondistended, bowel sounds positive. Central nervous system: Alert and oriented. No focal neurological deficits. Extremities: No edema, no cyanosis, pulses intact and symmetrical. Psychiatry: Judgement and insight appear slightly impaired.  DVT prophylaxis: Lovenox Code Status: DNR Family Communication: Discussed with daughter at bedside Disposition Plan:  Status is: Inpatient  Remains inpatient appropriate because:Inpatient level of care appropriate due to severity of illness  Dispo: The patient is from: SNF              Anticipated d/c is to:  SNF              Patient currently is not medically stable to d/c.   Difficult to place patient No              Level of care: Med-Surg  All the records are reviewed and case discussed with Care Management/Social Worker. Management plans discussed with the patient, nursing and they are in agreement.  Consultants:  Palliative care Infectious disease  Procedures:  Antimicrobials:  Linezolid Zosyn  Data Reviewed: I have personally reviewed following labs and imaging studies  CBC: Recent Labs  Lab 04/12/21 1526 04/13/21 0417 04/17/21 1559 04/19/21 0609  WBC 10.1 10.8* 9.5 12.2*  NEUTROABS 8.4*  --   --   --   HGB 10.8* 10.2* 9.6* 10.2*  HCT 33.0* 31.5* 30.2* 32.2*  MCV 91.9 93.5 93.8 91.2  PLT 208 197 248 314   Basic Metabolic Panel: Recent Labs  Lab 04/12/21 1526 04/13/21 0417 04/17/21 1559  04/18/21 0616 04/19/21 0609  NA 137  --  137  --  138  K 3.7  --  3.5  --  3.3*  CL 100  --  101  --  98  CO2 28  --  31  --  31  GLUCOSE 177*  --  170*  --  130*  BUN 23  --  13  --  14  CREATININE 0.74 0.81 0.66 0.67 0.55*  CALCIUM 8.7*  --  7.7*  --  8.1*  MG  --   --   --   --  1.6*   GFR: Estimated Creatinine Clearance: 49.2 mL/min (A) (by C-G formula based on SCr of 0.55 mg/dL (L)). Liver Function Tests: Recent Labs  Lab 04/12/21 1526  AST 23  ALT 13  ALKPHOS 61  BILITOT 1.7*  PROT 6.9  ALBUMIN 2.9*   No results for input(s): LIPASE, AMYLASE in the last 168 hours. No results for input(s): AMMONIA in the last 168 hours. Coagulation Profile: Recent Labs  Lab 04/12/21 1526 04/13/21 0417  INR 1.4* 1.5*   Cardiac Enzymes:  No results for input(s): CKTOTAL, CKMB, CKMBINDEX, TROPONINI in the last 168 hours. BNP (last 3 results) No results for input(s): PROBNP in the last 8760 hours. HbA1C: No results for input(s): HGBA1C in the last 72 hours. CBG: No results for input(s): GLUCAP in the last 168 hours. Lipid Profile: No results for input(s): CHOL, HDL, LDLCALC, TRIG, CHOLHDL, LDLDIRECT in the last 72 hours. Thyroid Function Tests: No results for input(s): TSH, T4TOTAL, FREET4, T3FREE, THYROIDAB in the last 72 hours. Anemia Panel: No results for input(s): VITAMINB12, FOLATE, FERRITIN, TIBC, IRON, RETICCTPCT in the last 72 hours. Sepsis Labs: Recent Labs  Lab 04/12/21 1526 04/13/21 0417  PROCALCITON  --  0.39  LATICACIDVEN 1.3  --     Recent Results (from the past 240 hour(s))  Microscopic Examination     Status: Abnormal   Collection Time: 04/12/21 11:49 AM   BLD  Result Value Ref Range Status   WBC, UA None seen 0 - 5 /hpf Final   RBC 0-2 0 - 2 /hpf Final   Epithelial Cells (non renal) 0-10 0 - 10 /hpf Final   Mucus, UA Present (A) Not Estab. Final   Bacteria, UA None seen None seen/Few Final  Resp Panel by RT-PCR (Flu A&B, Covid) Nasopharyngeal Swab      Status: None   Collection Time: 04/12/21  3:26 PM   Specimen: Nasopharyngeal Swab; Nasopharyngeal(NP) swabs in vial transport medium  Result Value Ref Range Status   SARS Coronavirus 2 by RT PCR NEGATIVE NEGATIVE Final    Comment: (NOTE) SARS-CoV-2 target nucleic acids are NOT DETECTED.  The SARS-CoV-2 RNA is generally detectable in upper respiratory specimens during the acute phase of infection. The lowest concentration of SARS-CoV-2 viral copies this assay can detect is 138 copies/mL. A negative result does not preclude SARS-Cov-2 infection and should not be used as the sole basis for treatment or other patient management decisions. A negative result may occur with  improper specimen collection/handling, submission of specimen other than nasopharyngeal swab, presence of viral mutation(s) within the areas targeted by this assay, and inadequate number of viral copies(<138 copies/mL). A negative result must be combined with clinical observations, patient history, and epidemiological information. The expected result is Negative.  Fact Sheet for Patients:  BloggerCourse.com  Fact Sheet for Healthcare Providers:  SeriousBroker.it  This test is no t yet approved or cleared by the [place] FDA and  has been authorized for detection and/or diagnosis of SARS-CoV-2 by FDA under an Emergency Use Authorization (EUA). This EUA will remain  in effect (meaning this test can be used) for the duration of the COVID-19 declaration under Section 564(b)(1) of the Act, 21 U.S.C.section 360bbb-3(b)(1), unless the authorization is terminated  or revoked sooner.       Influenza A by PCR NEGATIVE NEGATIVE Final   Influenza B by PCR NEGATIVE NEGATIVE Final    Comment: (NOTE) The Xpert Xpress SARS-CoV-2/FLU/RSV plus assay is intended as an aid in the diagnosis of influenza from Nasopharyngeal swab specimens and should not be used as a sole basis  for treatment. Nasal washings and aspirates are unacceptable for Xpert Xpress SARS-CoV-2/FLU/RSV testing.  Fact Sheet for Patients: BloggerCourse.com  Fact Sheet for Healthcare Providers: SeriousBroker.it  This test is not yet approved or cleared by the [place] FDA and has been authorized for detection and/or diagnosis of SARS-CoV-2 by FDA under an Emergency Use Authorization (EUA). This EUA will remain in effect (meaning this test can be used) for the duration of the  COVID-19 declaration under Section 564(b)(1) of the Act, 21 U.S.C. section 360bbb-3(b)(1), unless the authorization is terminated or revoked.  Performed at [facility], [address] [place], [place] [address]   Blood culture (single)     Status: None   Collection Time: 04/12/21  3:26 PM   Specimen: BLOOD  Result Value Ref Range Status   Specimen Description BLOOD BLOOD RIGHT FOREARM  Final   Special Requests   Final    BOTTLES DRAWN AEROBIC AND ANAEROBIC Blood Culture adequate volume   Culture   Final    NO GROWTH 5 DAYS Performed at [facility], [address] [place], [place] [address]    Report Status 04/17/2021 FINAL  Final  Culture, blood (single)     Status: None   Collection Time: 04/13/21 12:06 AM   Specimen: BLOOD RIGHT FOREARM  Result Value Ref Range Status   Specimen Description BLOOD RIGHT FOREARM  Final   Special Requests   Final    BOTTLES DRAWN AEROBIC AND ANAEROBIC Blood Culture adequate volume   Culture   Final    NO GROWTH 5 DAYS Performed at [facility], [address] [place], [place] [address]    Report Status 04/18/2021 FINAL  Final  Expectorated Sputum Assessment w Gram Stain, Rflx to Resp Cult     Status: None   Collection Time: 04/17/21  1:18 PM   Specimen: Sputum  Result Value Ref Range Status   Specimen Description SPUTUM  Final   Special Requests NONE  Final   Sputum evaluation    Final    Sputum specimen not acceptable for testing.  Please recollect.   Performed at [facility], [address] [place], [place] [address]    Report Status 04/17/2021 FINAL  Final  Expectorated Sputum Assessment w Gram Stain, Rflx to Resp Cult     Status: None   Collection Time: 04/17/21  3:15 PM   Specimen: Sputum  Result Value Ref Range Status   Specimen Description SPU  Final   Special Requests NONE  Final   Sputum evaluation   Final    THIS SPECIMEN IS ACCEPTABLE FOR SPUTUM CULTURE Performed at [facility], [address] [place], [place] [address]    Report Status 04/17/2021 FINAL  Final  Culture, Respiratory w Gram Stain     Status: None (Preliminary result)   Collection Time: 04/17/21  3:15 PM   Specimen: Sputum  Result Value Ref Range Status   Specimen Description   Final    SPU Performed at [facility], [address] [place], [place] [address]    Special Requests   Final    NONE Reflexed from [id number] Performed at [facility], [address] [place], [place] [address]    Gram Stain   Final    ABUNDANT SQUAMOUS EPITHELIAL CELLS PRESENT RARE WBC PRESENT, PREDOMINANTLY PMN RARE YEAST    Culture   Final    CULTURE REINCUBATED FOR BETTER GROWTH Performed at [facility], [address] [place], [place] [address]    Report Status PENDING  Incomplete  MRSA Next Gen by PCR, Nasal     Status: Abnormal   Collection Time: 04/17/21  5:14 PM   Specimen: Nasal Mucosa; Nasal Swab  Result Value Ref Range Status   MRSA by PCR Next Gen DETECTED (A) NOT DETECTED Final    Comment: RESULT CALLED TO, READ BACK BY AND VERIFIED WITH: KRISTYN DAVIS ON 04/17/21  @1906   SKL (NOTE) The GeneXpert MRSA Assay (FDA approved for NASAL specimens only), is one component of a comprehensive MRSA colonization surveillance program. It is not intended to diagnose MRSA infection nor to guide or monitor treatment for MRSA infections. Test  performance is not FDA approved in patients less than 30 years old. Performed at [facility], [address], [place], [place] [address]      Radiology Studies: No results found.  Scheduled Meds:  aspirin EC  81 mg Oral Daily   clopidogrel  75 mg Oral Daily   enoxaparin (LOVENOX) injection  40 mg Subcutaneous Q24H   gabapentin  600 mg Oral BID   metoprolol tartrate  25 mg Oral Daily   mirtazapine  7.5 mg Oral QHS   tamsulosin  0.4 mg Oral Daily   Continuous Infusions:  linezolid (ZYVOX) IV 600 mg (04/19/21 0903)   piperacillin-tazobactam (ZOSYN)  IV Stopped (04/19/21 0705)     LOS: 6 days   Time spent: 40 minutes. More than 50% of the time was spent in counseling/coordination of care  Arnetha Courser, MD [facility]  If 7PM-7AM, please contact night-coverage [email or web address]  04/19/2021, 2:14 PM   This record has been created using Conservation officer, historic buildings. Errors have been sought and corrected,but may not always be located. Such creation errors do not reflect on the standard of care.

## 2021-04-19 NOTE — Consult Note (Addendum)
Consultation Note Date: 04/19/2021   Patient Name: Jeffery Horne  DOB: [birth year]  MRN: [id number]  Age / Sex: 87 y.o., male  PCP: Venita Lick, NP Referring Physician: Lorella Nimrod, MD  Reason for Consultation: Establishing goals of care  HPI/Patient Profile: 87 y.o. male  with past medical history of DM, vascular disease, and HTN admitted on 04/12/2021 with weakness and progressive decrease in functional level, then a severe choking episode, confusion, hypoxia, and cough. Patient diagnosed with CAP, possible aspiration pna. CT chest obtained 8/8 that showed considerable consolidation gaseous areas, possibly some necrotic pneumonia. PMT consulted to discuss [place].   Clinical Assessment and Goals of Care: I have reviewed medical records including EPIC notes, labs and imaging, assessed the patient and then met with patient  to discuss diagnosis prognosis, GOC, EOL wishes, disposition and options.  I introduced Palliative Medicine as specialized medical care for people living with serious illness. It focuses on providing relief from the symptoms and stress of a serious illness. The goal is to improve quality of life for both the patient and the family.  Patient is not requiring oxygen. Sitting up in bed eating breakfast - most of his breakfast has been consumed. Consistently working with his flutter valve and incentive spirometer.  We discussed a brief life review of the patient. Patient tells me he has 3 children. His wife is deceased.   He shares his frustrations - mostly frustrated that he is still hospitalized. Wishes he was already at rehab. We discussed his need for ongoing IV antibiotic treatment. He expresses understanding. He really does not like the idea of going to rehab but is agreeable if recommended. He is hopeful to return to ALF soon.    We discussed patient's current illness and what it means in the larger context of patient's  on-going co-morbidities.   I attempted to elicit values and goals of care important to the patient.  Patient is very hopeful to return to prior level of function and return to ALF following rehab.   We reviewed a MOST form. Patient would like to complete this when his children are present - he plans to review with his daughter when she visits later today. He does confirm DNR status.   Discussed with patient the importance of continued conversation with family and the medical providers regarding overall plan of care and treatment options, ensuring decisions are within the context of the patient's values and GOCs.    Patient gives me permission to call his daughter Jeffery Horne. Attempted to call but no answer - voicemail with call back number left.  Questions and concerns were addressed. The family was encouraged to call with questions or concerns.   Primary Decision Maker PATIENT  50 of adult children if patient unable  SUMMARY OF RECOMMENDATIONS   - will continue to follow - unable to speak with family - patient appears well this morning, interested in continuing current treatment and going to rehab - MOST left at bedside for patient and family to review - DNR confirmed with patient  **Addendum: daughter Jeffery Horne returned call. Updated on patient condition. She remains hopeful for continued improvement and ability to go to rehab and return to ALF. Discussed MOST at bedside - she will review this evening.  Code Status/Advance Care Planning: DNR  Discharge Planning: [facility] for rehab with Palliative care service follow-up      Primary Diagnoses: Present on Admission:  Hypertension associated with diabetes ([place])  Type II diabetes mellitus with  neurological manifestations ([place])   I have reviewed the medical record, interviewed the patient and family, and examined the patient. The following aspects are pertinent.  Past Medical History:  Diagnosis Date   BPH  (benign prostatic hypertrophy) with urinary obstruction    Hearing impairment    Hyperlipidemia    Hypertension    Peripheral vascular disease ([place])    Primary gout    Type II diabetes mellitus with neurological manifestations ([place])    Social History   Socioeconomic History   Marital status: Widowed    Spouse name: Not on file   Number of children: Not on file   Years of education: Not on file   Highest education level: GED or equivalent  Occupational History   Not on file  Tobacco Use   Smoking status: Former    Types: Cigarettes    Quit date: 09/11/1971    Years since quitting: 49.6   Smokeless tobacco: Never  Vaping Use   Vaping Use: Never used  Substance and Sexual Activity   Alcohol use: No   Drug use: No   Sexual activity: Not on file  Other Topics Concern   Not on file  Social History Narrative   Not on file   Social Determinants of Health   Financial Resource Strain: Not on file  Food Insecurity: Not on file  Transportation Needs: Not on file  Physical Activity: Not on file  Stress: Not on file  Social Connections: Not on file   Family History  Problem Relation Age of Onset   Diabetes Mother    Hypertension Mother    Stroke Mother    Hypertension Father    Scheduled Meds:  aspirin EC  81 mg Oral Daily   clopidogrel  75 mg Oral Daily   enoxaparin (LOVENOX) injection  40 mg Subcutaneous Q24H   gabapentin  600 mg Oral BID   metoprolol tartrate  25 mg Oral Daily   mirtazapine  7.5 mg Oral QHS   tamsulosin  0.4 mg Oral Daily   Continuous Infusions:  linezolid (ZYVOX) IV 600 mg (04/19/21 0903)   piperacillin-tazobactam (ZOSYN)  IV 3.375 g (04/19/21 0211)   PRN Meds:.acetaminophen **OR** acetaminophen, Muscle Rub, ondansetron **OR** ondansetron (ZOFRAN) IV, senna-docusate No Known Allergies Review of Systems  Constitutional:  Positive for activity change and fatigue. Negative for appetite change.  Respiratory:  Negative for shortness of breath.    Neurological:  Positive for weakness.   Physical Exam Constitutional:      General: He is not in acute distress. Pulmonary:     Effort: Pulmonary effort is normal.  Skin:    General: Skin is warm and dry.  Neurological:     Mental Status: He is alert and oriented to person, place, and time.    Vital Signs: BP (!) 151/98 (BP Location: Right Arm)   Pulse 66   Temp 98.7 F (37.1 C)   Resp 18   Ht _0  (1.651 m)   Wt 59 kg   SpO2 96%   BMI 21.64 kg/m  Pain Scale: Faces POSS *See Group Information*: S-Acceptable,Sleep, easy to arouse Pain Score: 2    SpO2: SpO2: 96 % O2 Device:SpO2: 96 % O2 Flow Rate: .   IO: Intake/output summary:  Intake/Output Summary (Last 24 hours) at 04/19/2021 0953 Last data filed at 04/19/2021 0733 Gross per 24 hour  Intake 360 ml  Output 200 ml  Net 160 ml    LBM: Last BM Date: 04/15/21 Baseline Weight: Weight:  59 kg Most recent weight: Weight: 59 kg     Palliative Assessment/Data: PPS 40%    Time Total: 55 minutes Greater than 50%  of this time was spent counseling and coordinating care related to the above assessment and plan.  Juel Burrow, DNP, AGNP-C Palliative Medicine Team [phone] Pager: [phone]

## 2021-04-19 NOTE — Consult Note (Signed)
NAME: Jeffery Horne  DOB: 23-Feb-1927  MRN: [id number]  Date/Time: 04/19/2021 5:20 PM  REQUESTING PROVIDER: Dr.Amin Subjective:  REASON FOR CONSULT: Necrotizing pneumonia ? Jeffery Horne is a 87 y.o. with a history of hypertension, hyperlipidemia, gout, diabetes mellitus, pacemaker, syncopal episodes, falls, right intertrochanteric femur fracture status post nailing January 2022 presents to the ED on 04/12/2021 with weakness, syncope and choking episode. Is a resident of [name] and on 04/12/2021 his daughter had taken him to see his PCP.  After that they went to a restaurant and while he was eating a piece of meat he choked on it and was coughing a lot.  After he went to the nursing home his sats had dropped and he was also confused and was brought to the hospital.  In the ED vitals were initially 99.8 temperature but later short up to 102.5, BP 131/56, heart rate 77, respiratory 20 and sats of 91%.  Labs revealed WBC of 9.3, hemoglobin of 11.4 platelet of 270 and creatinine of 0.79. Chest x-ray was done and it showed interstitial airspace opacities in the right lung concerning for aspiration and/or pneumonia.  .Patient initially received a dose of Unasyn and that was changed to ceftriaxone and azithromycin. .  On 04/15/2021  plans were being made to discharge him when he started having fever.  On 04/17/2021 patient desaturated and a CT scan was done which showed worsening necrotizing pneumonia on the right side.  Ceftriaxone and azithromycin was stopped and he was started on linezolid and Zosyn.   Today I am asked to see the patient for necrotizing pneumonia.  Daughter is at bedside.  Patient says he coughs a lot whenever he eats meat. Speech therapy has assessed him and did consider him a risk for aspiration because of age and comorbidities but there was no specific concerns   Past Medical History:  Diagnosis Date   BPH (benign prostatic hypertrophy) with urinary obstruction    Hearing impairment     Hyperlipidemia    Hypertension    Peripheral vascular disease (HCC)    Primary gout    Type II diabetes mellitus with neurological manifestations (HCC)   Right intertrochanteric femur fracture Past Surgical History:  Procedure Laterality Date   HERNIA REPAIR     PACEMAKER INSERTION     Dr. Juel Burrow  Intramedullary nailing of the right femur fracture Social History   Socioeconomic History   Marital status: Widowed    Spouse name: Not on file   Number of children: Not on file   Years of education: Not on file   Highest education level: GED or equivalent  Occupational History   Not on file  Tobacco Use   Smoking status: Former    Types: Cigarettes    Quit date: 09/11/1971    Years since quitting: 49.6   Smokeless tobacco: Never  Vaping Use   Vaping Use: Never used  Substance and Sexual Activity   Alcohol use: No   Drug use: No   Sexual activity: Not on file  Other Topics Concern   Not on file  Social History Narrative   Not on file   Social Determinants of Health   Financial Resource Strain: Not on file  Food Insecurity: Not on file  Transportation Needs: Not on file  Physical Activity: Not on file  Stress: Not on file  Social Connections: Not on file  Intimate Partner Violence: Not on file    Family History  Problem Relation Age of Onset  Diabetes Mother    Hypertension Mother    Stroke Mother    Hypertension Father    No Known Allergies I? Current Facility-Administered Medications  Medication Dose Route Frequency Provider Last Rate Last Admin   acetaminophen (TYLENOL) tablet 650 mg  650 mg Oral Q6H PRN Andris Baumannuncan, Hazel V, MD   650 mg at 04/18/21 1814   Or   acetaminophen (TYLENOL) suppository 650 mg  650 mg Rectal Q6H PRN Andris Baumannuncan, Hazel V, MD       [START ON 04/20/2021] Ampicillin-Sulbactam (UNASYN) 3 g in sodium chloride 0.9 % 100 mL IVPB  3 g Intravenous Q6H Vince Ainsley, Rhodia AlbrightJayashree, MD       aspirin EC tablet 81 mg  81 mg Oral Daily Alberteen Samanford, Christopher P,  MD   81 mg at 04/19/21 0845   clopidogrel (PLAVIX) tablet 75 mg  75 mg Oral Daily Alberteen Samanford, Christopher P, MD   75 mg at 04/19/21 0845   enoxaparin (LOVENOX) injection 40 mg  40 mg Subcutaneous Q24H Lindajo Royaluncan, Hazel V, MD   40 mg at 04/19/21 0846   gabapentin (NEURONTIN) capsule 600 mg  600 mg Oral BID Alberteen Samanford, Christopher P, MD   600 mg at 04/19/21 0845   linezolid (ZYVOX) tablet 600 mg  600 mg Oral Q12H Lyvonne Cassell, Rhodia AlbrightJayashree, MD       metoprolol tartrate (LOPRESSOR) tablet 25 mg  25 mg Oral Daily Alberteen Samanford, Christopher P, MD   25 mg at 04/19/21 0845   mirtazapine (REMERON) tablet 7.5 mg  7.5 mg Oral QHS Alberteen Samanford, Christopher P, MD   7.5 mg at 04/18/21 2103   Muscle Rub CREA   Topical PRN Alberteen Samanford, Christopher P, MD   Given at 04/18/21 2300   ondansetron (ZOFRAN) tablet 4 mg  4 mg Oral Q6H PRN Andris Baumannuncan, Hazel V, MD       Or   ondansetron [facility](ZOFRAN) injection 4 mg  4 mg Intravenous Q6H PRN Andris Baumannuncan, Hazel V, MD       piperacillin-tazobactam (ZOSYN) IVPB 3.375 g  3.375 g Intravenous Q8H Sylver Vantassell, Rhodia AlbrightJayashree, MD 12.5 mL/hr at 04/19/21 1430 3.375 g at 04/19/21 1430   senna-docusate (Senokot-S) tablet 1 tablet  1 tablet Oral QHS PRN Andris Baumannuncan, Hazel V, MD       tamsulosin [facility](FLOMAX) capsule 0.4 mg  0.4 mg Oral Daily Alberteen Samanford, Christopher P, MD   0.4 mg at 04/19/21 0845     Abtx:  Anti-infectives (From admission, onward)    Start     Dose/Rate Route Frequency Ordered Stop   04/20/21 0000  Ampicillin-Sulbactam (UNASYN) 3 g in sodium chloride 0.9 % 100 mL IVPB        3 g 200 mL/hr over 30 Minutes Intravenous Every 6 hours 04/19/21 1442     04/19/21 2200  linezolid (ZYVOX) tablet 600 mg        600 mg Oral Every 12 hours 04/19/21 1442     04/19/21 0200  piperacillin-tazobactam (ZOSYN) IVPB 3.375 g        3.375 g 12.5 mL/hr over 240 Minutes Intravenous Every 8 hours 04/18/21 1304 04/19/21 2200   04/18/21 2000  vancomycin (VANCOCIN) IVPB 1000 mg/200 mL premix  Status:  Discontinued        1,000 mg 200 mL/hr over  60 Minutes Intravenous Every 24 hours 04/17/21 1923 04/18/21 1304   04/18/21 2000  linezolid (ZYVOX) IVPB 600 mg  Status:  Discontinued        600 mg 300 mL/hr over 60 Minutes Intravenous Every 12 hours 04/18/21 1304  04/19/21 1442   04/18/21 0100  meropenem (MERREM) 1 g in sodium chloride 0.9 % 100 mL IVPB        1 g 200 mL/hr over 30 Minutes Intravenous Every 12 hours 04/17/21 1923 04/18/21 1356   04/17/21 2015  vancomycin (VANCOREADY) IVPB 1250 mg/250 mL        1,250 mg 166.7 mL/hr over 90 Minutes Intravenous  Once 04/17/21 1923 04/18/21 0752   04/17/21 1730  piperacillin-tazobactam (ZOSYN) IVPB 3.375 g  Status:  Discontinued        3.375 g 12.5 mL/hr over 240 Minutes Intravenous Every 8 hours 04/17/21 1630 04/17/21 1917   04/15/21 0000  azithromycin (ZITHROMAX Z-PAK) 250 MG tablet        250 mg Oral Daily at bedtime 04/15/21 0931 04/17/21 2359   04/15/21 0000  cefpodoxime (VANTIN) 200 MG tablet        200 mg Oral 2 times daily 04/15/21 0931     04/13/21 1200  cefTRIAXone (ROCEPHIN) 2 g in sodium chloride 0.9 % 100 mL IVPB        2 g 200 mL/hr over 30 Minutes Intravenous Every 24 hours 04/13/21 0736 04/18/21 0752   04/13/21 0700  Ampicillin-Sulbactam (UNASYN) 3 g in sodium chloride 0.9 % 100 mL IVPB  Status:  Discontinued        3 g 200 mL/hr over 30 Minutes Intravenous Every 6 hours 04/13/21 0132 04/13/21 0736   04/13/21 0045  Ampicillin-Sulbactam (UNASYN) 3 g in sodium chloride 0.9 % 100 mL IVPB        3 g 200 mL/hr over 30 Minutes Intravenous STAT 04/13/21 0036 04/13/21 0222   04/13/21 0015  cefTRIAXone (ROCEPHIN) 2 g in sodium chloride 0.9 % 100 mL IVPB  Status:  Discontinued        2 g 200 mL/hr over 30 Minutes Intravenous Every 24 hours 04/13/21 0006 04/13/21 0036   04/13/21 0015  azithromycin (ZITHROMAX) 500 mg in sodium chloride 0.9 % 250 mL IVPB  Status:  Discontinued        500 mg 250 mL/hr over 60 Minutes Intravenous Every 24 hours 04/13/21 0006 04/17/21 1621        REVIEW OF SYSTEMS:  Const:  fever,  chills, negative weight loss Eyes: negative diplopia or visual changes, negative eye pain ENT: negative coryza, negative sore throat Resp:  cough, no hemoptysis, dyspnea Cards: negative for chest pain, palpitations, lower extremity edema GU: negative for frequency, dysuria and hematuria GI: Negative for abdominal pain, diarrhea, bleeding, constipation Skin: negative for rash and pruritus Heme: negative for easy bruising and gum/nose bleeding MS: Generalized weakness Neurolo:negative for headaches, dizziness, vertigo, memory problems has falls Psych: negative for feelings of anxiety, depression  Endocrine: , diabetes Allergy/Immunology-no known drug allergy Objective:  VITALS:  BP (!) 141/74 (BP Location: Left Arm)   Pulse 73   Temp 98.3 F (36.8 C)   Resp 18   Ht  (1.651 m)   Wt 59 kg   SpO2 90%   BMI 21.64 kg/m  PHYSICAL EXAM:  General: Alert, cooperative, no distress, appears stated age.  Emaciated, temporal wasting Head: Normocephalic, without obvious abnormality, atraumatic. Eyes: Conjunctivae clear, anicteric sclerae. Pupils are equal ENT Nares normal. No drainage or sinus tenderness. Lips, mucosa, and tongue normal. No Thrush Full set of dentures Neck: Supple, symmetrical, no adenopathy, thyroid: non tender no carotid bruit and no JVD. Back: No CVA tenderness. Lungs: Bilateral air entry.  Decreased in the right side.  Crepitations  present on the right side Heart: Regular rate and rhythm, no murmur, rub or gallop. Pacemaker site fine Abdomen: Soft, non-tender,not distended. Bowel sounds normal. No masses Extremities: atraumatic, no cyanosis. No edema. No clubbing Skin: No rashes or lesions. Or bruising Lymph: Cervical, supraclavicular normal. Neurologic: Grossly non-focal Pertinent Labs Lab Results CBC    Component Value Date/Time   WBC 12.2 (H) 04/19/2021 0609   RBC 3.53 (L) 04/19/2021 0609   HGB 10.2 (L)  04/19/2021 0609   HGB 11.4 (L) 04/12/2021 1154   HCT 32.2 (L) 04/19/2021 0609   HCT 37.0 (L) 04/12/2021 1154   PLT 314 04/19/2021 0609   PLT 217 04/12/2021 1154   MCV 91.2 04/19/2021 0609   MCV 92 04/12/2021 1154   MCH 28.9 04/19/2021 0609   MCHC 31.7 04/19/2021 0609   RDW 14.7 04/19/2021 0609   RDW 13.6 04/12/2021 1154   LYMPHSABS 0.9 04/12/2021 1526   LYMPHSABS 1.0 04/12/2021 1154   MONOABS 0.8 04/12/2021 1526   EOSABS 0.1 04/12/2021 1526   EOSABS 0.1 04/12/2021 1154   BASOSABS 0.0 04/12/2021 1526   BASOSABS 0.0 04/12/2021 1154    CMP Latest Ref Rng & Units 04/19/2021 04/18/2021 04/17/2021  Glucose 70 - 99 mg/dL 409(W) - 119(J)  BUN 8 - 23 mg/dL 14 - 13  Creatinine 4.78 - 1.24 mg/dL 2.95(A) 2.13 0.86  Sodium 135 - 145 mmol/L 138 - 137  Potassium 3.5 - 5.1 mmol/L 3.3(L) - 3.5  Chloride 98 - 111 mmol/L 98 - 101  CO2 22 - 32 mmol/L 31 - 31  Calcium 8.9 - 10.3 mg/dL 8.1(L) - 7.7(L)  Total Protein 6.5 - 8.1 g/dL - - -  Total Bilirubin 0.3 - 1.2 mg/dL - - -  Alkaline Phos 38 - 126 U/L - - -  AST 15 - 41 U/L - - -  ALT 0 - 44 U/L - - -      Microbiology: Recent Results (from the past 240 hour(s))  Microscopic Examination     Status: Abnormal   Collection Time: 04/12/21 11:49 AM   BLD  Result Value Ref Range Status   WBC, UA None seen 0 - 5 /hpf Final   RBC 0-2 0 - 2 /hpf Final   Epithelial Cells (non renal) 0-10 0 - 10 /hpf Final   Mucus, UA Present (A) Not Estab. Final   Bacteria, UA None seen None seen/Few Final  Resp Panel by RT-PCR (Flu A&B, Covid) Nasopharyngeal Swab     Status: None   Collection Time: 04/12/21  3:26 PM   Specimen: Nasopharyngeal Swab; Nasopharyngeal(NP) swabs in vial transport medium  Result Value Ref Range Status   SARS Coronavirus 2 by RT PCR NEGATIVE NEGATIVE Final    Comment: (NOTE) SARS-CoV-2 target nucleic acids are NOT DETECTED.  The SARS-CoV-2 RNA is generally detectable in upper respiratory specimens during the acute phase of infection.  The lowest concentration of SARS-CoV-2 viral copies this assay can detect is 138 copies/mL. A negative result does not preclude SARS-Cov-2 infection and should not be used as the sole basis for treatment or other patient management decisions. A negative result may occur with  improper specimen collection/handling, submission of specimen other than nasopharyngeal swab, presence of viral mutation(s) within the areas targeted by this assay, and inadequate number of viral copies(<138 copies/mL). A negative result must be combined with clinical observations, patient history, and epidemiological information. The expected result is Negative.  Fact Sheet for Patients:  BloggerCourse.com  Fact Sheet for Healthcare Providers:  SeriousBroker.it  This test is no t yet approved or cleared by the [place] and  has been authorized for detection and/or diagnosis of SARS-CoV-2 by FDA under an Emergency Use Authorization (EUA). This EUA will remain  in effect (meaning this test can be used) for the duration of the COVID-19 declaration under Section 564(b)(1) of the Act, 21 U.S.C.section 360bbb-3(b)(1), unless the authorization is terminated  or revoked sooner.       Influenza A by PCR NEGATIVE NEGATIVE Final   Influenza B by PCR NEGATIVE NEGATIVE Final    Comment: (NOTE) The Xpert Xpress SARS-CoV-2/FLU/RSV plus assay is intended as an aid in the diagnosis of influenza from Nasopharyngeal swab specimens and should not be used as a sole basis for treatment. Nasal washings and aspirates are unacceptable for Xpert Xpress SARS-CoV-2/FLU/RSV testing.  Fact Sheet for Patients: BloggerCourse.com  Fact Sheet for Healthcare Providers: SeriousBroker.it  This test is not yet approved or cleared by the [place] FDA and has been authorized for detection and/or diagnosis of SARS-CoV-2 by FDA  under an Emergency Use Authorization (EUA). This EUA will remain in effect (meaning this test can be used) for the duration of the COVID-19 declaration under Section 564(b)(1) of the Act, 21 U.S.C. section 360bbb-3(b)(1), unless the authorization is terminated or revoked.  Performed at [facility], [address] [place], [place] [address]   Blood culture (single)     Status: None   Collection Time: 04/12/21  3:26 PM   Specimen: BLOOD  Result Value Ref Range Status   Specimen Description BLOOD BLOOD RIGHT FOREARM  Final   Special Requests   Final    BOTTLES DRAWN AEROBIC AND ANAEROBIC Blood Culture adequate volume   Culture   Final    NO GROWTH 5 DAYS Performed at [facility], [address], [place], [place] [address]    Report Status 04/17/2021 FINAL  Final  Culture, blood (single)     Status: None   Collection Time: 04/13/21 12:06 AM   Specimen: BLOOD RIGHT FOREARM  Result Value Ref Range Status   Specimen Description BLOOD RIGHT FOREARM  Final   Special Requests   Final    BOTTLES DRAWN AEROBIC AND ANAEROBIC Blood Culture adequate volume   Culture   Final    NO GROWTH 5 DAYS Performed at [facility], [address] [place], [place] [address]    Report Status 04/18/2021 FINAL  Final  Expectorated Sputum Assessment w Gram Stain, Rflx to Resp Cult     Status: None   Collection Time: 04/17/21  1:18 PM   Specimen: Sputum  Result Value Ref Range Status   Specimen Description SPUTUM  Final   Special Requests NONE  Final   Sputum evaluation   Final    Sputum specimen not acceptable for testing.  Please recollect.   Performed at [facility], [address] [place], [place] [address]    Report Status 04/17/2021 FINAL  Final  Expectorated Sputum Assessment w Gram Stain, Rflx to Resp Cult     Status: None   Collection Time: 04/17/21  3:15 PM   Specimen: Sputum  Result Value Ref Range Status   Specimen Description SPU  Final    Special Requests NONE  Final   Sputum evaluation   Final    THIS SPECIMEN IS ACCEPTABLE FOR SPUTUM CULTURE Performed at [facility], [address], [place], [place] [address]    Report Status 04/17/2021 FINAL  Final  Culture, Respiratory w Gram Stain     Status: None (Preliminary result)   Collection Time: 04/17/21  3:15 PM   Specimen: Sputum  Result Value Ref Range Status   Specimen Description   Final    SPU Performed at [facility], [address] [place], [place] [address]    Special Requests   Final    NONE Reflexed from [id number] Performed at [facility], [address] [place], [place] [address]    Gram Stain   Final    ABUNDANT SQUAMOUS EPITHELIAL CELLS PRESENT RARE WBC PRESENT, PREDOMINANTLY PMN RARE YEAST    Culture   Final    CULTURE REINCUBATED FOR BETTER GROWTH Performed at [facility], [address], [place], [place] [address]    Report Status PENDING  Incomplete  MRSA Next Gen by PCR, Nasal     Status: Abnormal   Collection Time: 04/17/21  5:14 PM   Specimen: Nasal Mucosa; Nasal Swab  Result Value Ref Range Status   MRSA by PCR Next Gen DETECTED (A) NOT DETECTED Final    Comment: RESULT CALLED TO, READ BACK BY AND VERIFIED WITH: KRISTYN DAVIS ON 04/17/21  @1906  SKL (NOTE) The GeneXpert MRSA Assay (FDA approved for NASAL specimens only), is one component of a comprehensive MRSA colonization surveillance program. It is not intended to diagnose MRSA infection nor to guide or monitor treatment for MRSA infections. Test performance is not FDA approved in patients less than 62 years old. Performed at [facility], [address] [place], [place]     IMAGING RESULTS:  I have personally reviewed the films ?Large airspace opacity is noted posteriorly in the right upper and lower lobes concerning for pneumonia. Multiple cysts are seen in the inflamed portion of the right lung which may represent necrosis  or possibly underlying emphysema. Small right pleural effusion is noted.  Impression/Recommendation Necrotizing pneumonia right lung secondary to aspiration. Was worsening because the patient was not on anaerobic coverage in the beginning.  Was on ceftriaxone and azithromycin.  Now changed to Zosyn and linezolid because MRSA nares is positive. We can change the Zosyn to Unasyn.  And continue linezolid p.o. Very soon we will be able to make the Unasyn to Augmentin p.o. if he is afebrile for 48 hours. Await sputum culture as well. Will need antibiotics for at least 2 to 4 weeks depending on his progression. Would recommend a barium swallow or an imaging study to define his aspiration risk  Patient is on mirtazapine low-dose.  Watch closely for serotonin syndrome especially when on linezolid.  BPH on tamsulosin  Hypertension on metoprolol Patient is also on clopidogrel. Discussed the management with the patient and his daughter and the care team. ? ___________________________________________________ Discussed with patient, requesting provider Note:  This document was prepared using Dragon voice recognition software and may include unintentional dictation errors.

## 2021-04-20 DIAGNOSIS — Z515 Encounter for palliative care: Secondary | ICD-10-CM | POA: Diagnosis not present

## 2021-04-20 DIAGNOSIS — J690 Pneumonitis due to inhalation of food and vomit: Secondary | ICD-10-CM | POA: Diagnosis not present

## 2021-04-20 DIAGNOSIS — Z7189 Other specified counseling: Secondary | ICD-10-CM | POA: Diagnosis not present

## 2021-04-20 DIAGNOSIS — E1149 Type 2 diabetes mellitus with other diabetic neurological complication: Secondary | ICD-10-CM | POA: Diagnosis not present

## 2021-04-20 DIAGNOSIS — E1159 Type 2 diabetes mellitus with other circulatory complications: Secondary | ICD-10-CM | POA: Diagnosis not present

## 2021-04-20 DIAGNOSIS — R531 Weakness: Secondary | ICD-10-CM

## 2021-04-20 DIAGNOSIS — J15212 Pneumonia due to Methicillin resistant Staphylococcus aureus: Secondary | ICD-10-CM

## 2021-04-20 LAB — BASIC METABOLIC PANEL
Anion gap: 3 — ABNORMAL LOW (ref 5–15)
BUN: 16 mg/dL (ref 8–23)
CO2: 34 mmol/L — ABNORMAL HIGH (ref 22–32)
Calcium: 7.7 mg/dL — ABNORMAL LOW (ref 8.9–10.3)
Chloride: 102 mmol/L (ref 98–111)
Creatinine, Ser: 0.63 mg/dL (ref 0.61–1.24)
GFR, Estimated: >60 mL/min (ref >60)
Glucose, Bld: 114 mg/dL — ABNORMAL HIGH (ref 70–99)
Potassium: 3.7 mmol/L (ref 3.5–5.1)
Sodium: 139 mmol/L (ref 135–145)

## 2021-04-20 LAB — MAGNESIUM: Magnesium: 2 mg/dL (ref 1.7–2.4)

## 2021-04-20 MED ORDER — BOOST / RESOURCE BREEZE PO LIQD CUSTOM: 1.0000 | Freq: Three times a day (TID) | ORAL | Status: DC

## 2021-04-20 MED ORDER — METOPROLOL SUCCINATE ER 25 MG PO TB24
25.0000 mg | ORAL_TABLET | Freq: Every day | ORAL | Status: DC
  Administered 2021-04-21 – 2021-04-25 (×5): 25 mg via ORAL
  Filled 2021-04-20 (×5): qty 1

## 2021-04-20 NOTE — Progress Notes (Signed)
ID Pt doing better Says his breathing and cough better Nurse said he wa ssleeping the whole day and pt did acknowledge that he is sleeping as he has nothing else to do  Awake and alert BP 140/61 (BP Location: Right Arm)   Pulse 71   Temp 99.9 F (37.7 C)   Resp 16   Ht 5\' 5"  (1.651 m)   Wt 59 kg   SpO2 91%   BMI 21.64 kg/m   Chest b/l ir entry Crepts rt side [id number] Abd soft Cns non focal Sacrum- maceration/candida infection  Labs CBC Latest Ref Rng & Units 04/19/2021 04/17/2021 04/13/2021  WBC 4.0 - 10.5 K/uL 12.2(H) 9.5 10.8(H)  Hemoglobin 13.0 - 17.0 g/dL 10.2(L) 9.6(L) 10.2(L)  Hematocrit 39.0 - 52.0 % 32.2(L) 30.2(L) 31.5(L)  Platelets 150 - 400 K/uL 314 248 197    CMP Latest Ref Rng & Units 04/20/2021 04/19/2021 04/18/2021  Glucose 70 - 99 mg/dL 06/18/2021) 326(Z) -  BUN 8 - 23 mg/dL 16 14 -  Creatinine 124(P - 1.24 mg/dL 8.09 9.83) 3.82(N  Sodium 135 - 145 mmol/L 139 138 -  Potassium 3.5 - 5.1 mmol/L 3.7 3.3(L) -  Chloride 98 - 111 mmol/L 102 98 -  CO2 22 - 32 mmol/L 34(H) 31 -  Calcium 8.9 - 10.3 mg/dL 7.7(L) 8.1(L) -  Total Protein 6.5 - 8.1 g/dL - - -  Total Bilirubin 0.3 - 1.2 mg/dL - - -  Alkaline Phos 38 - 126 U/L - - -  AST 15 - 41 U/L - - -  ALT 0 - 44 U/L - - -     Impression/recommendation Necrotizing pneumonia right lung secondary to aspiration.  And superadded MRSA infection Was worsening because the patient was not on anaerobic coverage in the beginning.  Initially was on ceftriaxone azithromycin and now changed to Zosyn and linezolid because MRSA nares was positive.  Yesterday the Zosyn was changed to Unasyn.  And linezolid was made p.o. Sputum culture rare Staphylococcus aureus present  Will need antibiotics for at least 4 weeks. Will decide on final antibiotics the next few days  Patient is on mirtazapine low-dose.  Watch closely for serotonin syndrome especially when on linezolid.0.53 BPH on tamsulosin  Hypertension on metoprolol  Maceration of the  sacrum.  Apply Gerard's Butt cream   Discussed the management with the patient and his nurses.

## 2021-04-20 NOTE — Progress Notes (Signed)
PROGRESS NOTE    LARONE KLIETHERMES  ZOX:[id number] DOB: Jun 17, 1927 DOA: 04/12/2021 PCP: Marjie Skiff, NP   Brief Narrative: Taken from prior notes. Mr. Pursell is a 87 y.o. M with DM, vascular disease, and HTN who presented with few days weakness and progressive decrease in functional level, then a severe choking episode, confusion and hypoxia and cough.   At baseline the patient is able to transfer, do self cares, but over the last several days he has gotten weaker and weaker until he is unable to get up without assistance.  Then on the day of admission he developed a fever, had a prolonged severe choking episode, and appeared confused so 911 was called.  In the ER, CXR showed pneumonia.  Sepsis ruled out as there was no significant systemic response or organ failure noted.  Initially treated with Rocephin and a Zithromax. Continued to have fever, repeat CT chest on 04/17/2021 with some concern of necrotizing pneumonia and worsening of emphysema.  MRSA swab positive.  Currently on linezolid and Zosyn.  Discussed with radiology and interventional radiology.  There may be a small to moderate pleural effusion, but treatment of this would not improve the patient's oxygenation or breathing status, and at present there is no reason to think it would improve diagnostic yield given sputum cultures pending.  Given the risk of pneumothorax, which would likely be a life ending complication for this patient procedure was deferred.  Infectious disease was consulted on 04/19/2021 as continued to spike fever.  Sputum cultures growing rare staph aureus and Candida albicans, final results pending.  Patient is very high risk for deterioration and death based on his age, current illness and prior comorbidities.  Palliative care was also consulted.  Subjective: Patient remained quite lethargic when seen today.,  Following commands.  Ate some breakfast.  Appetite remains poor.  Daughter at bedside.  Remained afebrile  over the past 24 hours.  Assessment & Plan:   Principal Problem:   Aspiration pneumonia (HCC) Active Problems:   Hypertension associated with diabetes (HCC)   Type II diabetes mellitus with neurological manifestations (HCC)   Sepsis (HCC)   Generalized weakness  Right lower lobe possible aspiration pneumonia.  Initially treated with Rocephin and Zithromax and later switched to linezolid and Zosyn with poor response. Swallowed he was also consulted and apparently low risk for aspiration. CT chest done on 04/17/2021 with considerable consolidation, gaseous areas, possibly some necrotic pneumonia and worsening of emphysema.  MRSA PCR positive. Patient is not a candidate for VATS in case of an prior pneumonia or loculated effusion. ID was consulted and they switch zosyn with Unasyn, and recommending 4 weeks of Abx. Antibiotics can be switched to p.o. if remained afebrile for more than 48 hours. Currently afebrile for 24 hours. -Continue with linezolid and Unasyn -Follow-up sputum cultures-currently growing rare staph aureus and rare Candida albicans, final results pending. -Continue with aggressive pulmonary toilet -Continue with PT  Hypokalemia and hypomagnesemia.  Resolved.  Potassium at 3.7 with magnesium of 2.0. -Replete electrolytes as needed and monitor  Hypertension, cardiovascular disease BP mildly elevated. - Continue aspirin, Plavix, metoprolol  Polyneuropathy - Continue gabapentin  BPH - Continue Flomax   Mild dementia Lives in ALF - Hold mirtazapine-while patient is on linezolid to decrease the chance of serotonin syndrome  Diet controlled type 2 diabetes with polyneuropathy Glucose normal here  Acute metabolic encephalopathy Hospital delirium At baseline the patient is oriented to person, place, and time.   At present, he  has some continued waxing and waning inattention and disorientation   Severe protein caloric malnutrition.  Evident by severe muscle wasting.   Apparently developed diarrhea with boost or Ensure. -Dietitian consult  Objective: Vitals:   04/20/21 0012 04/20/21 0446 04/20/21 0815 04/20/21 1128  BP: 122/62 (!) 145/49 137/60 (!) 151/53  Pulse: 60 62 60 63  Resp: 18 16 20 20   Temp: 98.4 F (36.9 C) 98.2 F (36.8 C) 98 F (36.7 C) 98.2 F (36.8 C)  TempSrc:   Oral Oral  SpO2: (!) 88% (!) 86% 92% 93%  Weight:      Height:        Intake/Output Summary (Last 24 hours) at 04/20/2021 1345 Last data filed at 04/20/2021 1128 Gross per 24 hour  Intake 900 ml  Output 650 ml  Net 250 ml    Filed Weights   04/12/21 1523 04/13/21 0306  Weight: 59 kg 59 kg    Examination:  General.  Frail and lethargic elderly man, in no acute distress. Pulmonary.  Lungs clear bilaterally, normal respiratory effort. CV.  Regular rate and rhythm, no JVD, rub or murmur. Abdomen.  Soft, nontender, nondistended, BS positive. CNS.  Lethargic, following commands, no apparent focal deficit Extremities.  No edema, no cyanosis, pulses intact and symmetrical. Psychiatry.  Judgment and insight appears impaired.  DVT prophylaxis: Lovenox Code Status: DNR Family Communication: Discussed with daughter at bedside Disposition Plan:  Status is: Inpatient  Remains inpatient appropriate because:Inpatient level of care appropriate due to severity of illness  Dispo: The patient is from: SNF              Anticipated d/c is to:  SNF              Patient currently is not medically stable to d/c.   Difficult to place patient No              Level of care: Med-Surg  All the records are reviewed and case discussed with Care Management/Social Worker. Management plans discussed with the patient, nursing and they are in agreement.  Consultants:  Palliative care Infectious disease  Procedures:  Antimicrobials:  Linezolid Unasyn  Data Reviewed: I have personally reviewed following labs and imaging studies  CBC: Recent Labs  Lab 04/17/21 1559  04/19/21 0609  WBC 9.5 12.2*  HGB 9.6* 10.2*  HCT 30.2* 32.2*  MCV 93.8 91.2  PLT 248 314    Basic Metabolic Panel: Recent Labs  Lab 04/17/21 1559 04/18/21 0616 04/19/21 0609 04/20/21 0605  NA 137  --  138 139  K 3.5  --  3.3* 3.7  CL 101  --  98 102  CO2 31  --  31 34*  GLUCOSE 170*  --  130* 114*  BUN 13  --  14 16  CREATININE 0.66 0.67 0.55* 0.63  CALCIUM 7.7*  --  8.1* 7.7*  MG  --   --  1.6* 2.0    GFR: Estimated Creatinine Clearance: 49.2 mL/min (by C-G formula based on SCr of 0.63 mg/dL). Liver Function Tests: No results for input(s): AST, ALT, ALKPHOS, BILITOT, PROT, ALBUMIN in the last 168 hours.  No results for input(s): LIPASE, AMYLASE in the last 168 hours. No results for input(s): AMMONIA in the last 168 hours. Coagulation Profile: No results for input(s): INR, PROTIME in the last 168 hours.  Cardiac Enzymes: No results for input(s): CKTOTAL, CKMB, CKMBINDEX, TROPONINI in the last 168 hours. BNP (last 3 results) No results for input(s): PROBNP  in the last 8760 hours. HbA1C: No results for input(s): HGBA1C in the last 72 hours. CBG: No results for input(s): GLUCAP in the last 168 hours. Lipid Profile: No results for input(s): CHOL, HDL, LDLCALC, TRIG, CHOLHDL, LDLDIRECT in the last 72 hours. Thyroid Function Tests: No results for input(s): TSH, T4TOTAL, FREET4, T3FREE, THYROIDAB in the last 72 hours. Anemia Panel: No results for input(s): VITAMINB12, FOLATE, FERRITIN, TIBC, IRON, RETICCTPCT in the last 72 hours. Sepsis Labs: No results for input(s): PROCALCITON, LATICACIDVEN in the last 168 hours.   Recent Results (from the past 240 hour(s))  Microscopic Examination     Status: Abnormal   Collection Time: 04/12/21 11:49 AM   BLD  Result Value Ref Range Status   WBC, UA None seen 0 - 5 /hpf Final   RBC 0-2 0 - 2 /hpf Final   Epithelial Cells (non renal) 0-10 0 - 10 /hpf Final   Mucus, UA Present (A) Not Estab. Final   Bacteria, UA None seen  None seen/Few Final  Resp Panel by RT-PCR (Flu A&B, Covid) Nasopharyngeal Swab     Status: None   Collection Time: 04/12/21  3:26 PM   Specimen: Nasopharyngeal Swab; Nasopharyngeal(NP) swabs in vial transport medium  Result Value Ref Range Status   SARS Coronavirus 2 by RT PCR NEGATIVE NEGATIVE Final    Comment: (NOTE) SARS-CoV-2 target nucleic acids are NOT DETECTED.  The SARS-CoV-2 RNA is generally detectable in upper respiratory specimens during the acute phase of infection. The lowest concentration of SARS-CoV-2 viral copies this assay can detect is 138 copies/mL. A negative result does not preclude SARS-Cov-2 infection and should not be used as the sole basis for treatment or other patient management decisions. A negative result may occur with  improper specimen collection/handling, submission of specimen other than nasopharyngeal swab, presence of viral mutation(s) within the areas targeted by this assay, and inadequate number of viral copies(<138 copies/mL). A negative result must be combined with clinical observations, patient history, and epidemiological information. The expected result is Negative.  Fact Sheet for Patients:  BloggerCourse.com  Fact Sheet for Healthcare Providers:  SeriousBroker.it  This test is no t yet approved or cleared by the [place] FDA and  has been authorized for detection and/or diagnosis of SARS-CoV-2 by FDA under an Emergency Use Authorization (EUA). This EUA will remain  in effect (meaning this test can be used) for the duration of the COVID-19 declaration under Section 564(b)(1) of the Act, 21 U.S.C.section 360bbb-3(b)(1), unless the authorization is terminated  or revoked sooner.       Influenza A by PCR NEGATIVE NEGATIVE Final   Influenza B by PCR NEGATIVE NEGATIVE Final    Comment: (NOTE) The Xpert Xpress SARS-CoV-2/FLU/RSV plus assay is intended as an aid in the diagnosis of  influenza from Nasopharyngeal swab specimens and should not be used as a sole basis for treatment. Nasal washings and aspirates are unacceptable for Xpert Xpress SARS-CoV-2/FLU/RSV testing.  Fact Sheet for Patients: BloggerCourse.com  Fact Sheet for Healthcare Providers: SeriousBroker.it  This test is not yet approved or cleared by the [place] FDA and has been authorized for detection and/or diagnosis of SARS-CoV-2 by FDA under an Emergency Use Authorization (EUA). This EUA will remain in effect (meaning this test can be used) for the duration of the COVID-19 declaration under Section 564(b)(1) of the Act, 21 U.S.C. section 360bbb-3(b)(1), unless the authorization is terminated or revoked.  Performed at [facility], [address] [place], [place] [address]  Blood culture (single)     Status: None   Collection Time: 04/12/21  3:26 PM   Specimen: BLOOD  Result Value Ref Range Status   Specimen Description BLOOD BLOOD RIGHT FOREARM  Final   Special Requests   Final    BOTTLES DRAWN AEROBIC AND ANAEROBIC Blood Culture adequate volume   Culture   Final    NO GROWTH 5 DAYS Performed at [facility], [address] [place], [place] [address]    Report Status 04/17/2021 FINAL  Final  Culture, blood (single)     Status: None   Collection Time: 04/13/21 12:06 AM   Specimen: BLOOD RIGHT FOREARM  Result Value Ref Range Status   Specimen Description BLOOD RIGHT FOREARM  Final   Special Requests   Final    BOTTLES DRAWN AEROBIC AND ANAEROBIC Blood Culture adequate volume   Culture   Final    NO GROWTH 5 DAYS Performed at [facility], [address], [place], [place] [address]    Report Status 04/18/2021 FINAL  Final  Expectorated Sputum Assessment w Gram Stain, Rflx to Resp Cult     Status: None   Collection Time: 04/17/21  1:18 PM   Specimen: Sputum  Result Value Ref Range Status   Specimen  Description SPUTUM  Final   Special Requests NONE  Final   Sputum evaluation   Final    Sputum specimen not acceptable for testing.  Please recollect.   Performed at [facility], [address] [place], [place] [address]    Report Status 04/17/2021 FINAL  Final  Expectorated Sputum Assessment w Gram Stain, Rflx to Resp Cult     Status: None   Collection Time: 04/17/21  3:15 PM   Specimen: Sputum  Result Value Ref Range Status   Specimen Description SPU  Final   Special Requests NONE  Final   Sputum evaluation   Final    THIS SPECIMEN IS ACCEPTABLE FOR SPUTUM CULTURE Performed at [facility], [address], [place], [place] [address]    Report Status 04/17/2021 FINAL  Final  Culture, Respiratory w Gram Stain     Status: None (Preliminary result)   Collection Time: 04/17/21  3:15 PM   Specimen: Sputum  Result Value Ref Range Status   Specimen Description   Final    SPU Performed at [facility], [address], [place], [place] [address]    Special Requests   Final    NONE Reflexed from [id number] Performed at [facility], [address] [place], [place] [address]    Gram Stain   Final    ABUNDANT SQUAMOUS EPITHELIAL CELLS PRESENT RARE WBC PRESENT, PREDOMINANTLY PMN RARE YEAST    Culture   Final    RARE CANDIDA ALBICANS RARE STAPHYLOCOCCUS AUREUS SUSCEPTIBILITIES TO FOLLOW Performed at [facility], [address], [place], [place] [address]    Report Status PENDING  Incomplete  MRSA Next Gen by PCR, Nasal     Status: Abnormal   Collection Time: 04/17/21  5:14 PM   Specimen: Nasal Mucosa; Nasal Swab  Result Value Ref Range Status   MRSA by PCR Next Gen DETECTED (A) NOT DETECTED Final    Comment: RESULT CALLED TO, READ BACK BY AND VERIFIED WITH: KRISTYN DAVIS ON 04/17/21  @1906  SKL (NOTE) The GeneXpert MRSA Assay (FDA approved for NASAL specimens only), is one component of a comprehensive MRSA colonization  surveillance program. It is not intended to diagnose MRSA  infection nor to guide or monitor treatment for MRSA infections. Test performance is not FDA approved in patients less than 85 years old. Performed at [facility], [address] [place], [place] [address]       Radiology Studies: No results found.  Scheduled Meds:  aspirin EC  81 mg Oral Daily   clopidogrel  75 mg Oral Daily   enoxaparin (LOVENOX) injection  40 mg Subcutaneous Q24H   gabapentin  600 mg Oral BID   linezolid  600 mg Oral Q12H   [START ON 04/21/2021] metoprolol succinate  25 mg Oral Daily   mirtazapine  7.5 mg Oral QHS   tamsulosin  0.4 mg Oral Daily   Continuous Infusions:  ampicillin-sulbactam (UNASYN) IV 3 g (04/20/21 1217)     LOS: 7 days   Time spent: 42 minutes. More than 50% of the time was spent in counseling/coordination of care  Arnetha CourserSumayya Sheamus Hasting, MD [facility]  If 7PM-7AM, please contact night-coverage [email or web address]  04/20/2021, 1:45 PM   This record has been created using Conservation officer, historic buildingsDragon voice recognition software. Errors have been sought and corrected,but may not always be located. Such creation errors do not reflect on the standard of care.

## 2021-04-20 NOTE — TOC Progression Note (Signed)
Transition of Care [facility]) - Progression Note    Patient Details  Name: Jeffery Horne MRN: [id number] Date of Birth: 04/03/1927  Transition of Care [facility]) CM/SW Contact  Allayne Butcher, RN Phone Number: 04/20/2021, 12:15 PM  Clinical Narrative:    Palliative care is following patient during hospitalization.  Patient voiced to palliative that he wants to work on getting better go to rehab and then get back to [employer].   [facility] reached out to let [facility] team know that [employer] is good through today but will need to be restarted if patient is not discharged today.    Patient is still not medically ready for DC.  When closer to discharge RNCM will restart insurance authorization.     Expected Discharge Plan: Skilled Nursing Facility Barriers to Discharge: Continued Medical Work up  Expected Discharge Plan and Services Expected Discharge Plan: Skilled Nursing Facility   Discharge Planning Services: CM Consult   Living arrangements for the past 2 months: Assisted Living Facility Expected Discharge Date: 04/15/21               DME Arranged: N/A DME Agency: NA       HH Arranged: NA HH Agency: NA         Social Determinants of Health (SDOH) Interventions    Readmission Risk Interventions No flowsheet data found.

## 2021-04-20 NOTE — Progress Notes (Signed)
Patient's pressure sores on buttocks has progressed to stage 2. Placed xeroform and foam on wound after cleaning area. MD is aware.

## 2021-04-20 NOTE — Progress Notes (Signed)
Daily Progress Note   Patient Name: Jeffery Horne       Date: 04/20/2021 DOB: Nov 20, 1926  Age: 87 y.o. MRN#: [id number] Attending Physician: Arnetha Courser, MD Primary Care Physician: Marjie Skiff, NP Admit Date: 04/12/2021  Reason for Consultation/Follow-up: Establishing goals of care  Subjective: Patient sleeping today. Did not wake. Daughter Jeffery Horne at bedside.   Length of Stay: 7  Current Medications: Scheduled Meds:   aspirin EC  81 mg Oral Daily   clopidogrel  75 mg Oral Daily   enoxaparin (LOVENOX) injection  40 mg Subcutaneous Q24H   gabapentin  600 mg Oral BID   linezolid  600 mg Oral Q12H   [START ON 04/21/2021] metoprolol succinate  25 mg Oral Daily   mirtazapine  7.5 mg Oral QHS   tamsulosin  0.4 mg Oral Daily    Continuous Infusions:  ampicillin-sulbactam (UNASYN) IV 3 g (04/20/21 1217)    PRN Meds: acetaminophen **OR** acetaminophen, Muscle Rub, ondansetron **OR** ondansetron (ZOFRAN) IV, senna-docusate  Physical Exam Constitutional:      General: He is not in acute distress. Pulmonary:     Effort: Pulmonary effort is normal.  Skin:    General: Skin is warm and dry.            Vital Signs: BP (!) 151/53 (BP Location: Left Arm)   Pulse 63   Temp 98.2 F (36.8 C) (Oral)   Resp 20   Ht 5\' 5"  (1.651 m)   Wt 59 kg   SpO2 93%   BMI 21.64 kg/m  SpO2: SpO2: 93 % O2 Device: O2 Device: Room Air O2 Flow Rate:    Intake/output summary:  Intake/Output Summary (Last 24 hours) at 04/20/2021 1234 Last data filed at 04/20/2021 1128 Gross per 24 hour  Intake 900 ml  Output 775 ml  Net 125 ml   LBM: Last BM Date: 04/20/21 Baseline Weight: Weight: 59 kg Most recent weight: Weight: 59 kg       Palliative Assessment/Data: PPS 40%    Flowsheet Rows    Flowsheet  Row Most Recent Value  Intake Tab   Referral Department Hospitalist  Unit at Time of Referral Med/Surg Unit  Palliative Care Primary Diagnosis Pulmonary  Date Notified 04/19/21  Palliative Care Type New Palliative care  Reason for referral Clarify Goals of Care  Date of Admission 04/12/21  Date first seen by Palliative Care 04/19/21  # of days Palliative referral response time 0 Day(s)  # of days IP prior to Palliative referral 7  Clinical Assessment   Psychosocial & Spiritual Assessment   Palliative Care Outcomes        Patient Active Problem List   Diagnosis Date Noted   Aspiration pneumonia (HCC) 04/13/2021   Sepsis (HCC) 04/13/2021   Generalized weakness 04/13/2021   Protein-calorie malnutrition (HCC) 08/31/2020   Syncope 08/31/2020   Emphysema lung (HCC) 08/26/2020   Aortic atherosclerosis (HCC) 08/26/2020   Cardiac pacemaker 04/23/2020   Carotid artery stenosis, symptomatic, right 03/20/2018   Advanced care planning/counseling discussion 10/01/2017   Skin lesion of face 05/16/2016   Sick sinus syndrome (HCC) 07/28/2015   BPH with obstruction/lower urinary tract symptoms 07/28/2015   Hearing impairment 07/28/2015  Gait abnormality 05/25/2015   Arthritis of ankle, left 05/11/2015   Chronic hoarseness 05/11/2015   Hypertension associated with diabetes (HCC)    Type II diabetes mellitus with neurological manifestations (HCC)    Hyperlipidemia associated with type 2 diabetes mellitus (HCC)     Palliative Care Assessment & Plan   HPI: 87 y.o. male  with past medical history of DM, vascular disease, and HTN admitted on 04/12/2021 with weakness and progressive decrease in functional level, then a severe choking episode, confusion, hypoxia, and cough. Patient diagnosed with CAP, possible aspiration pna. CT chest obtained 8/8 that showed considerable consolidation gaseous areas, possibly some necrotic pneumonia. PMT consulted to discuss GOC.   Assessment: F/u with daughter  Jeffery Horne at bedside. Provided updates. She asks about vital signs and we reviewed - no fevers overnight or today.   She shares her concerns about prolonged hospitalization. She worries about his weakness. She acknowledges that rehab will be significant hurdle for him as he becomes weaker with limited activity in the hospital. She does maintain goal to go to rehab and attempt return to some level of independence.   Reviewed MOST form. She has completed it with patient. The patient and family outlined their wishes for the following treatment decisions:  Cardiopulmonary Resuscitation: Do Not Attempt Resuscitation (DNR/No CPR)  Medical Interventions: Limited Additional Interventions: Use medical treatment, IV fluids and cardiac monitoring as indicated, DO NOT USE intubation or mechanical ventilation. May consider use of less invasive airway support such as BiPAP or CPAP. Also provide comfort measures. Transfer to the hospital if indicated. Avoid intensive care.   Antibiotics: Antibiotics if indicated  IV Fluids: IV fluids if indicated  Feeding Tube: No feeding tube    Recommendations/Plan: Will follow Patient and family remain interested in continuing current treatment plan and pursuing rehab MOST completed and placed on chart  Code Status: DNR  Discharge Planning: Skilled Nursing Facility for rehab with Palliative care service follow-up  Care plan was discussed with patient's daughter Gilford [facility]  Thank you for allowing the Palliative Medicine Team to assist in the care of this patient.   Total Time 20 minutes Prolonged Time Billed  no       Greater than 50%  of this time was spent counseling and coordinating care related to the above assessment and plan.  Gerlean Ren, DNP, [facility] Palliative Medicine Team Team Phone # [phone]  Pager [phone]

## 2021-04-21 DIAGNOSIS — R531 Weakness: Secondary | ICD-10-CM | POA: Diagnosis not present

## 2021-04-21 DIAGNOSIS — J15212 Pneumonia due to Methicillin resistant Staphylococcus aureus: Secondary | ICD-10-CM | POA: Diagnosis not present

## 2021-04-21 DIAGNOSIS — J9601 Acute respiratory failure with hypoxia: Secondary | ICD-10-CM | POA: Diagnosis not present

## 2021-04-21 DIAGNOSIS — Z7189 Other specified counseling: Secondary | ICD-10-CM | POA: Diagnosis not present

## 2021-04-21 DIAGNOSIS — J690 Pneumonitis due to inhalation of food and vomit: Secondary | ICD-10-CM | POA: Diagnosis not present

## 2021-04-21 LAB — CULTURE, RESPIRATORY W GRAM STAIN

## 2021-04-21 NOTE — Progress Notes (Signed)
Physical Therapy Treatment Patient Details Name: Jeffery Horne MRN: [id number] DOB: 1927-04-28 Today's Date: 04/21/2021    History of Present Illness As per MD: [name] is a 87 y.o. male with medical history significant for BPH, HTN, DM, PVD, resident of an assisted living facility who was sent to the ED for evaluation of generalized weakness and confusion that appeared to have started several hours following a choking episode at the facility.  Over the past several days prior to the choking episode patient, who at baseline can care for himself with little assistance, has been declining and had generalized weakness.  Following the episode, he gradually appeared more confused, more lethargic and they reported O2 levels down to 89% on room air.  History is limited due to lethargy but there have been no prior reports of vomiting or diarrhea, abdominal pain or complaints of chest pain or shortness of breath. Pt, No longer on O2 via [place] and is able ot maintain SPO2>93% at all time.    PT Comments    Pt alert with daughter in room and present throughout treatment. Pt agitated when asked to participate in treatment but with encouragement agrees for session. Pt notes 8/10 L ankle pain at rest, meds provided prior to session. Pt declined OOB mobility but agreeable to in bed and sitting at EOB.  Pt requires mod-A for bed mobility including supine <> sit and lateral scooting. With verbal cues pt is able to bring BLE off bed and reach for bed rails, assistance is necessary to bring trunk upright and scoot toward EOB. Pt performed therapeutic exercises to maintain AROM demonstrating ability to sit at EOB for knee extension and supine hip flexion without active assistance. Discharge recommendations remain SNF with 24-hr assistance/supervision. Skilled PT intervention is indicated to address deficits in function, mobility, and to return to PLOF as able.      Follow Up Recommendations   SNF;Supervision/Assistance - 24 hour     Equipment Recommendations  Other (comment) (TBD next venue of care)    Recommendations for Other Services       Precautions / Restrictions Precautions Precautions: Fall Restrictions Weight Bearing Restrictions: No    Mobility  Bed Mobility Overal bed mobility: Needs Assistance Bed Mobility: Supine to Sit;Sit to Supine     Supine to sit: Mod assist Sit to supine: Mod assist   General bed mobility comments: Cues for reaching and utilizing bed rails; lateral scoot with mod-A    Transfers                 General transfer comment: Transfers not attempted due to pt refusal secondary to L ankle pain  Ambulation/Gait                 Stairs             Wheelchair Mobility    Modified Rankin (Stroke Patients Only)       Balance Overall balance assessment: Needs assistance Sitting-balance support: Feet supported;No upper extremity supported Sitting balance-Leahy Scale: Fair Sitting balance - Comments: Pt able to raise BUE and maintain balance w/ slight posterior lean but does not tolerate challenge       Standing balance comment: Standing was not attempted this session                            Cognition Arousal/Alertness: Awake/alert Behavior During Therapy: Agitated Overall Cognitive Status: Within Functional Limits for tasks assessed  General Comments: Pt is agitated due to pain but follows 1-step commands and wiling to participate with encouragement      Exercises General Exercises - Lower Extremity Ankle Circles/Pumps: AROM;Both;15 reps;Supine Long Arc Quad: AROM;Strengthening;Both;Seated;10 reps Heel Slides: AROM;10 reps;Both;Supine Hip ABduction/ADduction: AROM;Both;10 reps Hip Flexion/Marching: AROM;Strengthening;Both;Supine;10 reps Other Exercises Other Exercises: Lateral scooting w/ mod-A, pt able to position UE without cues, mod-A  for physical assist    General Comments        Pertinent Vitals/Pain Pain Score: 8  Pain Location: L ankle Pain Descriptors / Indicators: Aching Pain Intervention(s): Limited activity within patient's tolerance;Monitored during session;Repositioned    Home Living                      Prior Function            PT Goals (current goals can now be found in the care plan section) Acute Rehab PT Goals Patient Stated Goal: To "get out of here" PT Goal Formulation: With patient/family Potential to Achieve Goals: Fair Progress towards PT goals: Progressing toward goals    Frequency    Min 2X/week      PT Plan Current plan remains appropriate    Co-evaluation              AM-PAC PT "6 Clicks" Mobility   Outcome Measure  Help needed turning from your back to your side while in a flat bed without using bedrails?: A Little Help needed moving from lying on your back to sitting on the side of a flat bed without using bedrails?: A Lot Help needed moving to and from a bed to a chair (including a wheelchair)?: A Lot Help needed standing up from a chair using your arms (e.g., wheelchair or bedside chair)?: A Lot Help needed to walk in hospital room?: A Lot Help needed climbing 3-5 steps with a railing? : Total 6 Click Score: 12    End of Session Equipment Utilized During Treatment: Gait belt Activity Tolerance: Patient limited by pain;Patient tolerated treatment well Patient left: in bed;with call bell/phone within reach;with bed alarm set   PT Visit Diagnosis: Unsteadiness on feet (R26.81);Muscle weakness (generalized) (M62.81);Difficulty in walking, not elsewhere classified (R26.2)     Time: 9833-8250 PT Time Calculation (min) (ACUTE ONLY): 29 min  Charges:                        [employer], SPT

## 2021-04-21 NOTE — Progress Notes (Signed)
[facility]) Hospital Liaison RN Note  Notified of family request for [facility] Palliative services at home after discharge.    [facility] hospital liaison will follow patient for discharge disposition.   Please do not hesitate to call with any hospice or outpatient palliative care related questions.   Thank you for the opportunity to participate in this patient's care.   Bobbie "Einar Gip, RN, BSN [facility] Liaison [phone]

## 2021-04-21 NOTE — Progress Notes (Signed)
Initial Nutrition Assessment  DOCUMENTATION CODES:  Severe malnutrition in context of chronic illness  INTERVENTION:  Adjust diet to DYS 3 for easier meal consumption Magic cup TID with meals, each supplement provides 290 kcal and 9 grams of protein Snacks TID Request new measured weight  NUTRITION DIAGNOSIS:  Severe Malnutrition (in the context of chronic illness) related to dysphagia as evidenced by severe fat depletion, severe muscle depletion.  GOAL:  Patient will meet greater than or equal to 90% of their needs  MONITOR:  PO intake, Weight trends  REASON FOR ASSESSMENT:  Rounds (MD requests evaluation)    ASSESSMENT:  87 y.o. male with medical history significant for gout, HTN, DM type 2, PVD, dementia, and HLD presented to ED from ALF for weakness and confusion that appeared several hours following a choking episode at facility. Work up suggestive of pneumonia.  Pt resting in bed at the time of visit, daughter at bedside. Pt is hard of hearing, daughter assists with hx. Pt adamantly refuses to try boost breeze, states boost gives him diarrhea and he will not try the product, despite education that boost breeze is not milk based. Will remove. Pt also reports he does not like the food that is being brought to him. Inquired about what types of food pt would prefer to have and he replied "meat." Daughter states he likes souther cooking and inquired about bringing food from outside, reiterated conversation MD had with sister earlier in the day that outside food was ok. Attempted to obtain food preferences, daughter not sure what pt likes to eat. Pt does like chocolate milk, will ask kitchen to have some available in nourishment room for pt.   Discussed intake with RN. Reports that pt has dentures, but doesn't like to wear them. Daughter had reported that they did not fit well. MD adjusted to soft foods earlier - GI soft diet was entered, adjusted to DYS 3 so soft foods will be sent  versus low fiber.  Average Meal Intake: 8/3-8/12: 62% intake x 14 recorded meals (0-100%)  Nutritionally Relevant Medications: Scheduled Meds:  feeding supplement  1 Container Oral TID BM   linezolid  600 mg Oral Q12H   Continuous Infusions:  ampicillin-sulbactam (UNASYN) IV 3 g (04/21/21 0641)   PRN Meds: ondansetron senna-docusate  Labs Reviewed  NUTRITION - FOCUSED PHYSICAL EXAM: Flowsheet Row Most Recent Value  Orbital Region Severe depletion  Upper Arm Region Severe depletion  Thoracic and Lumbar Region Severe depletion  Buccal Region Severe depletion  Temple Region Severe depletion  Clavicle Bone Region Severe depletion  Clavicle and Acromion Bone Region Severe depletion  Scapular Bone Region Severe depletion  Dorsal Hand Severe depletion  Patellar Region Moderate depletion  Anterior Thigh Region Moderate depletion  Posterior Calf Region Moderate depletion  Edema (RD Assessment) None  Hair Reviewed  Eyes Reviewed  Mouth Reviewed  Skin Reviewed  Nails Reviewed   Diet Order:   Diet Order             DIET DYS 3 Room service appropriate? Yes; Fluid consistency: Thin  Diet effective now           Diet - low sodium heart healthy                   EDUCATION NEEDS:  No education needs have been identified at this time  Skin:  Skin Assessment: Reviewed RN Assessment  Last BM:  8/12 - type 7  Height:  Ht Readings from Last  1 Encounters:  04/12/21 5\' 5"  (1.651 m)   Weight:  Wt Readings from Last 1 Encounters:  04/13/21 59 kg    Ideal Body Weight:  61.8 kg  BMI:  Body mass index is 21.64 kg/m.  Estimated Nutritional Needs:  Kcal:  1600-1800 kcal/d Protein:  80-90 g/d Fluid:  1800-2000 kcal/d   06/13/21, RD, LDN Clinical Dietitian Pager on Amion

## 2021-04-21 NOTE — Progress Notes (Signed)
   Date of Admission:  04/12/2021    ID: Jeffery Horne is a 87 y.o. male  Principal Problem:   Aspiration pneumonia (HCC) Active Problems:   Hypertension associated with diabetes (HCC)   Type II diabetes mellitus with neurological manifestations (HCC)   Sepsis (HCC)   Generalized weakness   Daughter at bedside Subjective: Patient is eating meat loaf Has to clear the throat every time he eats with a little water and coughs occasionally No shortness of breath No fever  Medications:   aspirin EC  81 mg Oral Daily   clopidogrel  75 mg Oral Daily   enoxaparin (LOVENOX) injection  40 mg Subcutaneous Q24H   gabapentin  600 mg Oral BID   linezolid  600 mg Oral Q12H   metoprolol succinate  25 mg Oral Daily   tamsulosin  0.4 mg Oral Daily    Objective: Vital signs in last 24 hours: Temp:  [98 F (36.7 C)-100.2 F (37.9 C)] 98.8 F (37.1 C) (08/12 1612) Pulse Rate:  [60-71] 61 (08/12 1612) Resp:  [16-20] 16 (08/12 1612) BP: (123-140)/(53-63) 127/54 (08/12 1612) SpO2:  [91 %-94 %] 93 % (08/12 1612)  PHYSICAL EXAM:  General: Alert, cooperative, no distress, nauseated Head: Normocephalic, without obvious abnormality, atraumatic. Eyes: Conjunctivae clear, anicteric sclerae. Pupils are equal ENT Nares normal. No drainage or sinus tenderness. Lips, mucosa, and tongue normal. No Thrush Full set of dentures Neck: Supple, symmetrical, no adenopathy, thyroid: non tender no carotid bruit and no JVD. Back: No CVA tenderness. Lungs: Bilateral air entry. Crepitations on the right side Heart: Regular rate and rhythm, no murmur, rub or gallop. Abdomen: Soft, non-tender,not distended. Bowel sounds normal. No masses Extremities: atraumatic, no cyanosis. No edema. No clubbing Skin: No rashes or lesions. Or bruising Lymph: Cervical, supraclavicular normal. Neurologic: Grossly non-focal  Lab Results Recent Labs    04/19/21 0609 04/20/21 0605  WBC 12.2*  --   HGB 10.2*  --   HCT  32.2*  --   NA 138 139  K 3.3* 3.7  CL 98 102  CO2 31 34*  BUN 14 16  CREATININE 0.55* 0.63   Microbiology: Sputum -MRSA Studies/Results: No results found.   Assessment/Plan:  Necrotizing pneumonia on the right lung secondary to aspiration and superadded MRSA infection.  Patient has been afebrile for the past 3 days and starting on linezolid and Zosyn which has been changed to Unasyn. Will need antibiotics for at least 4 weeks.  But will not be able to give linezolid for this long ar period.  So after initial period of linezolid we may be able to switch to doxycycline plus Augmentin.  Patient is on low-dose mirtazapine.  Watch closely for serotonin syndrome.  BPH on tamsulosin  Hypertension on metoprolol  Discussed the management with the patient and his daughter at bedside. ID will follow him peripherally this weekend.  Call if needed.

## 2021-04-21 NOTE — Progress Notes (Signed)
OT Cancellation Note  Patient Details Name: Jeffery Horne MRN: [id number] DOB: 19-Nov-1926   Cancelled Treatment:    Reason Eval/Treat Not Completed: Patient declined, no reason specified;Pain limiting ability to participate. Upon attempt, pt in bed, endorsing 8/10 L ankle pain. Pt agreeable to wash washcloth to wash his face. Declined brushing his teeth and additional intervention. Visitor in room reports having put more cream on his L ankle. RN notified of pain and cream applied. Pt declined additional repositioning or OOB. Will re-attempt OT tx at later date/time as appropriate.   Wynona Canes, MPH, MS, OTR/L ascom [phone] 04/21/21, 2:03 PM

## 2021-04-21 NOTE — Progress Notes (Signed)
OT Cancellation Note  Patient Details Name: Jeffery Horne MRN: [id number] DOB: Jan 16, 1927   Cancelled Treatment:    Reason Eval/Treat Not Completed: Other (comment). Pt with MD. Will re-attempt OT tx at later time/date as pt is available and appropriate.   Wynona Canes, MPH, MS, OTR/L ascom [phone] 04/21/21, 10:34 AM

## 2021-04-21 NOTE — TOC Progression Note (Signed)
Transition of Care [facility]) - Progression Note    Patient Details  Name: Jeffery Horne MRN: [id number] Date of Birth: 04-18-1927  [facility] of [facility]) CM/SW Contact  Allayne Butcher, RN Phone Number: 04/21/2021, 1:23 PM  Clinical Narrative:    Patient was still running a low grade temp this morning.  MD feels that patient is improving and will hopefully be ready for discharge on Monday.     Expected Discharge Plan: Skilled Nursing Facility Barriers to Discharge: Continued Medical Work up  Expected Discharge Plan and Services Expected Discharge Plan: Skilled Nursing Facility   Discharge Planning Services: CM Consult   Living arrangements for the past 2 months: Assisted Living Facility Expected Discharge Date: 04/15/21               DME Arranged: N/A DME Agency: NA       HH Arranged: NA HH Agency: NA         Social Determinants of Health (SDOH) Interventions    Readmission Risk Interventions No flowsheet data found.

## 2021-04-21 NOTE — Progress Notes (Signed)
Daily Progress Note   Patient Name: Jeffery Horne       Date: 04/21/2021 DOB: 1927-03-13  Age: 87 y.o. MRN#: [id number] Attending Physician: Arnetha Courser, MD Primary Care Physician: Marjie Skiff, NP Admit Date: 04/12/2021  Reason for Consultation/Follow-up: Establishing goals of care  Subjective: More awake and interactive today. Very frustrated with situation but tells me he feels like he is breathing well. Breakfast tray in front of him - hasn't eaten much yet.   Length of Stay: 8  Current Medications: Scheduled Meds:   aspirin EC  81 mg Oral Daily   clopidogrel  75 mg Oral Daily   enoxaparin (LOVENOX) injection  40 mg Subcutaneous Q24H   feeding supplement  1 Container Oral TID BM   gabapentin  600 mg Oral BID   linezolid  600 mg Oral Q12H   metoprolol succinate  25 mg Oral Daily   tamsulosin  0.4 mg Oral Daily    Continuous Infusions:  ampicillin-sulbactam (UNASYN) IV 3 g (04/21/21 0641)    PRN Meds: acetaminophen **OR** acetaminophen, Muscle Rub, ondansetron **OR** ondansetron (ZOFRAN) IV, senna-docusate  Physical Exam Constitutional:      General: He is not in acute distress. Pulmonary:     Effort: Pulmonary effort is normal.  Skin:    General: Skin is warm and dry.  Neurological:     Mental Status: He is alert and oriented to person, place, and time.            Vital Signs: BP 126/60 (BP Location: Right Arm)   Pulse 60   Temp 98 F (36.7 C) (Axillary)   Resp 18   Ht 5\' 5"  (1.651 m)   Wt 59 kg   SpO2 92%   BMI 21.64 kg/m  SpO2: SpO2: 92 % O2 Device: O2 Device: Room Air O2 Flow Rate:    Intake/output summary:  Intake/Output Summary (Last 24 hours) at 04/21/2021 1037 Last data filed at 04/21/2021 1018 Gross per 24 hour  Intake 878.34 ml  Output 625 ml  Net  253.34 ml    LBM: Last BM Date: 04/21/21 Baseline Weight: Weight: 59 kg Most recent weight: Weight: 59 kg       Palliative Assessment/Data: PPS 40%    Flowsheet Rows    Flowsheet Row Most Recent Value  Intake Tab   Referral Department Hospitalist  Unit at Time of Referral Med/Surg Unit  Palliative Care Primary Diagnosis Pulmonary  Date Notified 04/19/21  Palliative Care Type New Palliative care  Reason for referral Clarify Goals of Care  Date of Admission 04/12/21  Date first seen by Palliative Care 04/19/21  # of days Palliative referral response time 0 Day(s)  # of days IP prior to Palliative referral 7  Clinical Assessment   Psychosocial & Spiritual Assessment   Palliative Care Outcomes        Patient Active Problem List   Diagnosis Date Noted   Aspiration pneumonia (HCC) 04/13/2021   Sepsis (HCC) 04/13/2021   Generalized weakness 04/13/2021   Protein-calorie malnutrition (HCC) 08/31/2020   Syncope 08/31/2020   Emphysema lung (HCC) 08/26/2020   Aortic atherosclerosis (HCC) 08/26/2020   Cardiac pacemaker 04/23/2020   Carotid artery stenosis, symptomatic, right  03/20/2018   Advanced care planning/counseling discussion 10/01/2017   Skin lesion of face 05/16/2016   Sick sinus syndrome (HCC) 07/28/2015   BPH with obstruction/lower urinary tract symptoms 07/28/2015   Hearing impairment 07/28/2015   Gait abnormality 05/25/2015   Arthritis of ankle, left 05/11/2015   Chronic hoarseness 05/11/2015   Hypertension associated with diabetes (HCC)    Type II diabetes mellitus with neurological manifestations (HCC)    Hyperlipidemia associated with type 2 diabetes mellitus (HCC)     Palliative Care Assessment & Plan   HPI: 87 y.o. male  with past medical history of DM, vascular disease, and HTN admitted on 04/12/2021 with weakness and progressive decrease in functional level, then a severe choking episode, confusion, hypoxia, and cough. Patient diagnosed with CAP,  possible aspiration pna. CT chest obtained 8/8 that showed considerable consolidation gaseous areas, possibly some necrotic pneumonia. PMT consulted to discuss GOC.   Assessment: Patient quite frustrated with care today but tells me his feels like he is breathing well. Trying to eat some breakfast. More alert today.   Follow up with daughter [name] via telephone. We review the situation. Review plan for antibiotics x4 weeks. Daughter expresses understanding. Review plan for dc to SNF.   We discuss concern that prolonged hospitalization will be difficult for patient to recover from. Daughter is quite aware of this and concerned about this as well. We discuss referral to outpatient palliative for extra support. She is agreeable. Hospice briefly discussed - discussed that with further decline or no improvement hospice could be considered and daughter agrees as well.   Notified [facility] liaison of palliative care outpatient referral.   Recommendations/Plan: Patient and family remain interested in continuing current treatment plan and pursuing rehab MOST completed and placed on chart Referral made for outpatient palliative  Code Status: DNR  Discharge Planning: Skilled Nursing Facility for rehab with Palliative care service follow-up  Care plan was discussed with patient's daughter [name] Thank you for allowing the Palliative Medicine Team to assist in the care of this patient.   Total Time 25 minutes Prolonged Time Billed  no       Greater than 50%  of this time was spent counseling and coordinating care related to the above assessment and plan.  Gerlean Ren, DNP, [facility] Palliative Medicine Team Team Phone # [phone]  Pager [phone]

## 2021-04-21 NOTE — Progress Notes (Signed)
PROGRESS NOTE    Jeffery Horne  WUJ:[id number] DOB: 1926-12-28 DOA: 04/12/2021 PCP: Marjie Skiffannady, Jolene T, NP   Brief Narrative: Taken from prior notes. Jeffery Horne is a 87 y.o. M with DM, vascular disease, and HTN who presented with few days weakness and progressive decrease in functional level, then a severe choking episode, confusion and hypoxia and cough.   At baseline the patient is able to transfer, do self cares, but over the last several days he has gotten weaker and weaker until he is unable to get up without assistance.  Then on the day of admission he developed a fever, had a prolonged severe choking episode, and appeared confused so 911 was called.  In the ER, CXR showed pneumonia.  Sepsis ruled out as there was no significant systemic response or organ failure noted.  Initially treated with Rocephin and a Zithromax. Continued to have fever, repeat CT chest on 04/17/2021 with some concern of necrotizing pneumonia and worsening of emphysema.  MRSA swab positive.  Currently on linezolid and Zosyn.  Discussed with radiology and interventional radiology.  There may be a small to moderate pleural effusion, but treatment of this would not improve the patient's oxygenation or breathing status, and at present there is no reason to think it would improve diagnostic yield given sputum cultures pending.  Given the risk of pneumothorax, which would likely be a life ending complication for this patient procedure was deferred.  Infectious disease was consulted on 04/19/2021 as continued to spike fever.  Sputum cultures growing MRSA and Candida albicans, final results pending.  Patient is very high risk for deterioration and death based on his age, current illness and prior comorbidities.  Palliative care was also consulted.  Subjective: Patient was feeling little improved when seen today.  He was more alert, ate little bit of breakfast, stating that he does not like hospital food.  Daughter at bedside.   We discussed about getting food from outside as far as he can maintain his nutrition. Had a low-grade fever yesterday evening around 6:30 PM.  Continue to have cough with yellowish phlegm.  Assessment & Plan:   Principal Problem:   Aspiration pneumonia (HCC) Active Problems:   Hypertension associated with diabetes (HCC)   Type II diabetes mellitus with neurological manifestations (HCC)   Sepsis (HCC)   Generalized weakness  Right lower lobe possible aspiration pneumonia.  Initially treated with Rocephin and Zithromax and later switched to linezolid and Zosyn with poor response. Swallowed he was also consulted and apparently low risk for aspiration. CT chest done on 04/17/2021 with considerable consolidation, gaseous areas, possibly some necrotic pneumonia and worsening of emphysema.  MRSA PCR positive. Patient is not a candidate for VATS in case of an prior pneumonia or loculated effusion. ID was consulted and they switch zosyn with Unasyn, and recommending 4 weeks of Abx. Antibiotics can be switched to p.o. if remained afebrile for more than 48 hours.  Had low grade fever yesterday evening around 6:30 PM. -Continue with linezolid and Unasyn -Follow-up sputum cultures-with MRSA and rare Candida albicans. -Continue with aggressive pulmonary toilet -Continue with PT-recommending SNF placement.  If remained afebrile most likely leave earlier next week.  Will need a repeat COVID testing on Sunday.  Hypokalemia and hypomagnesemia.  Resolved.   -Replete electrolytes as needed and monitor  Hypertension, cardiovascular disease BP mildly elevated. - Continue aspirin, Plavix, metoprolol  Polyneuropathy - Continue gabapentin  BPH - Continue Flomax   Mild dementia Lives in ALF and will  be going to SNF. - Hold mirtazapine-while patient is on linezolid to decrease the chance of serotonin syndrome  Diet controlled type 2 diabetes with polyneuropathy Glucose normal here  Acute metabolic  encephalopathy Hospital delirium At baseline the patient is oriented to person, place, and time.   At present, he has some continued waxing and waning inattention and disorientation   Severe protein caloric malnutrition.  Evident by severe muscle wasting.  Apparently developed diarrhea with boost or Ensure. -Dietitian consult  Objective: Vitals:   04/21/21 0012 04/21/21 0436 04/21/21 0747 04/21/21 1151  BP: (!) 123/54 (!) 124/53 126/60 134/63  Pulse: 64 63 60 66  Resp: 18 16 18 20   Temp: 99 F (37.2 C) 99 F (37.2 C) 98 F (36.7 C) 98.4 F (36.9 C)  TempSrc: Axillary  Axillary   SpO2: 91% 91% 92% 94%  Weight:      Height:        Intake/Output Summary (Last 24 hours) at 04/21/2021 1532 Last data filed at 04/21/2021 1300 Gross per 24 hour  Intake 878.34 ml  Output 1150 ml  Net -271.66 ml    Filed Weights   04/12/21 1523 04/13/21 0306  Weight: 59 kg 59 kg    Examination:  General.  Frail and malnourished gentleman, in no acute distress. Pulmonary.  Lungs clear bilaterally, normal respiratory effort. CV.  Regular rate and rhythm, no JVD, rub or murmur. Abdomen.  Soft, nontender, nondistended, BS positive. CNS.  Alert and oriented x3.  No focal neurologic deficit. Extremities.  No edema, no cyanosis, pulses intact and symmetrical. Psychiatry.  Judgment and insight appears normal.   DVT prophylaxis: Lovenox Code Status: DNR Family Communication: Discussed with daughter at bedside Disposition Plan:  Status is: Inpatient  Remains inpatient appropriate because:Inpatient level of care appropriate due to severity of illness  Dispo: The patient is from: SNF              Anticipated d/c is to:  SNF              Patient currently is not medically stable to d/c.   Difficult to place patient No              Level of care: Med-Surg  All the records are reviewed and case discussed with Care Management/Social Worker. Management plans discussed with the patient, nursing and  they are in agreement.  Consultants:  Palliative care Infectious disease  Procedures:  Antimicrobials:  Linezolid Unasyn  Data Reviewed: I have personally reviewed following labs and imaging studies  CBC: Recent Labs  Lab 04/17/21 1559 04/19/21 0609  WBC 9.5 12.2*  HGB 9.6* 10.2*  HCT 30.2* 32.2*  MCV 93.8 91.2  PLT 248 314    Basic Metabolic Panel: Recent Labs  Lab 04/17/21 1559 04/18/21 0616 04/19/21 0609 04/20/21 0605  NA 137  --  138 139  K 3.5  --  3.3* 3.7  CL 101  --  98 102  CO2 31  --  31 34*  GLUCOSE 170*  --  130* 114*  BUN 13  --  14 16  CREATININE 0.66 0.67 0.55* 0.63  CALCIUM 7.7*  --  8.1* 7.7*  MG  --   --  1.6* 2.0    GFR: Estimated Creatinine Clearance: 49.2 mL/min (by C-G formula based on SCr of 0.63 mg/dL). Liver Function Tests: No results for input(s): AST, ALT, ALKPHOS, BILITOT, PROT, ALBUMIN in the last 168 hours.  No results for input(s): LIPASE, AMYLASE in the  last 168 hours. No results for input(s): AMMONIA in the last 168 hours. Coagulation Profile: No results for input(s): INR, PROTIME in the last 168 hours.  Cardiac Enzymes: No results for input(s): CKTOTAL, CKMB, CKMBINDEX, TROPONINI in the last 168 hours. BNP (last 3 results) No results for input(s): PROBNP in the last 8760 hours. HbA1C: No results for input(s): HGBA1C in the last 72 hours. CBG: No results for input(s): GLUCAP in the last 168 hours. Lipid Profile: No results for input(s): CHOL, HDL, LDLCALC, TRIG, CHOLHDL, LDLDIRECT in the last 72 hours. Thyroid Function Tests: No results for input(s): TSH, T4TOTAL, FREET4, T3FREE, THYROIDAB in the last 72 hours. Anemia Panel: No results for input(s): VITAMINB12, FOLATE, FERRITIN, TIBC, IRON, RETICCTPCT in the last 72 hours. Sepsis Labs: No results for input(s): PROCALCITON, LATICACIDVEN in the last 168 hours.   Recent Results (from the past 240 hour(s))  Microscopic Examination     Status: Abnormal   Collection  Time: 04/12/21 11:49 AM   BLD  Result Value Ref Range Status   WBC, UA None seen 0 - 5 /hpf Final   RBC 0-2 0 - 2 /hpf Final   Epithelial Cells (non renal) 0-10 0 - 10 /hpf Final   Mucus, UA Present (A) Not Estab. Final   Bacteria, UA None seen None seen/Few Final  Resp Panel by RT-PCR (Flu A&B, Covid) Nasopharyngeal Swab     Status: None   Collection Time: 04/12/21  3:26 PM   Specimen: Nasopharyngeal Swab; Nasopharyngeal(NP) swabs in vial transport medium  Result Value Ref Range Status   SARS Coronavirus 2 by RT PCR NEGATIVE NEGATIVE Final    Comment: (NOTE) SARS-CoV-2 target nucleic acids are NOT DETECTED.  The SARS-CoV-2 RNA is generally detectable in upper respiratory specimens during the acute phase of infection. The lowest concentration of SARS-CoV-2 viral copies this assay can detect is 138 copies/mL. A negative result does not preclude SARS-Cov-2 infection and should not be used as the sole basis for treatment or other patient management decisions. A negative result may occur with  improper specimen collection/handling, submission of specimen other than nasopharyngeal swab, presence of viral mutation(s) within the areas targeted by this assay, and inadequate number of viral copies(<138 copies/mL). A negative result must be combined with clinical observations, patient history, and epidemiological information. The expected result is Negative.  Fact Sheet for Patients:  BloggerCourse.com  Fact Sheet for Healthcare Providers:  SeriousBroker.it  This test is no t yet approved or cleared by the [place] FDA and  has been authorized for detection and/or diagnosis of SARS-CoV-2 by FDA under an Emergency Use Authorization (EUA). This EUA will remain  in effect (meaning this test can be used) for the duration of the COVID-19 declaration under Section 564(b)(1) of the Act, 21 U.S.C.section 360bbb-3(b)(1), unless the  authorization is terminated  or revoked sooner.       Influenza A by PCR NEGATIVE NEGATIVE Final   Influenza B by PCR NEGATIVE NEGATIVE Final    Comment: (NOTE) The Xpert Xpress SARS-CoV-2/FLU/RSV plus assay is intended as an aid in the diagnosis of influenza from Nasopharyngeal swab specimens and should not be used as a sole basis for treatment. Nasal washings and aspirates are unacceptable for Xpert Xpress SARS-CoV-2/FLU/RSV testing.  Fact Sheet for Patients: BloggerCourse.com  Fact Sheet for Healthcare Providers: SeriousBroker.it  This test is not yet approved or cleared by the [place] FDA and has been authorized for detection and/or diagnosis of SARS-CoV-2 by FDA under an Emergency Use  Authorization (EUA). This EUA will remain in effect (meaning this test can be used) for the duration of the COVID-19 declaration under Section 564(b)(1) of the Act, 21 U.S.C. section 360bbb-3(b)(1), unless the authorization is terminated or revoked.  Performed at [facility], [address] [place], [place] [address]   Blood culture (single)     Status: None   Collection Time: 04/12/21  3:26 PM   Specimen: BLOOD  Result Value Ref Range Status   Specimen Description BLOOD BLOOD RIGHT FOREARM  Final   Special Requests   Final    BOTTLES DRAWN AEROBIC AND ANAEROBIC Blood Culture adequate volume   Culture   Final    NO GROWTH 5 DAYS Performed at [facility], [address] [place], [place] [address]    Report Status 04/17/2021 FINAL  Final  Culture, blood (single)     Status: None   Collection Time: 04/13/21 12:06 AM   Specimen: BLOOD RIGHT FOREARM  Result Value Ref Range Status   Specimen Description BLOOD RIGHT FOREARM  Final   Special Requests   Final    BOTTLES DRAWN AEROBIC AND ANAEROBIC Blood Culture adequate volume   Culture   Final    NO GROWTH 5 DAYS Performed at [facility], [address], [place], [place] [address]    Report Status 04/18/2021 FINAL  Final  Expectorated Sputum Assessment w Gram Stain, Rflx to Resp Cult     Status: None   Collection Time: 04/17/21  1:18 PM   Specimen: Sputum  Result Value Ref Range Status   Specimen Description SPUTUM  Final   Special Requests NONE  Final   Sputum evaluation   Final    Sputum specimen not acceptable for testing.  Please recollect.   Performed at [facility], [address] [place], [place] [address]    Report Status 04/17/2021 FINAL  Final  Expectorated Sputum Assessment w Gram Stain, Rflx to Resp Cult     Status: None   Collection Time: 04/17/21  3:15 PM   Specimen: Sputum  Result Value Ref Range Status   Specimen Description SPU  Final   Special Requests NONE  Final   Sputum evaluation   Final    THIS SPECIMEN IS ACCEPTABLE FOR SPUTUM CULTURE Performed at [facility], [address] [place], [place] [address]    Report Status 04/17/2021 FINAL  Final  Culture, Respiratory w Gram Stain     Status: None   Collection Time: 04/17/21  3:15 PM   Specimen: Sputum  Result Value Ref Range Status   Specimen Description   Final    SPU Performed at [facility], [address], [place], [place] [address]    Special Requests   Final    NONE Reflexed from [id number] Performed at [facility], [address] [place], [place] [address]    Gram Stain   Final    ABUNDANT SQUAMOUS EPITHELIAL CELLS PRESENT RARE WBC PRESENT, PREDOMINANTLY PMN RARE YEAST Performed at [facility], [address] [place], [place] [address]    Culture   Final    RARE CANDIDA ALBICANS RARE METHICILLIN RESISTANT STAPHYLOCOCCUS AUREUS    Report Status 04/21/2021 FINAL  Final   Organism ID, Bacteria METHICILLIN RESISTANT STAPHYLOCOCCUS AUREUS  Final      Susceptibility   Methicillin resistant staphylococcus aureus - MIC*    CIPROFLOXACIN >=8 RESISTANT Resistant     ERYTHROMYCIN >=8  RESISTANT Resistant  GENTAMICIN <=0.5 SENSITIVE Sensitive     OXACILLIN >=4 RESISTANT Resistant     TETRACYCLINE <=1 SENSITIVE Sensitive     VANCOMYCIN 1 SENSITIVE Sensitive     TRIMETH/SULFA >=320 RESISTANT Resistant     CLINDAMYCIN <=0.25 SENSITIVE Sensitive     RIFAMPIN <=0.5 SENSITIVE Sensitive     Inducible Clindamycin NEGATIVE Sensitive     * RARE METHICILLIN RESISTANT STAPHYLOCOCCUS AUREUS  MRSA Next Gen by PCR, Nasal     Status: Abnormal   Collection Time: 04/17/21  5:14 PM   Specimen: Nasal Mucosa; Nasal Swab  Result Value Ref Range Status   MRSA by PCR Next Gen DETECTED (A) NOT DETECTED Final    Comment: RESULT CALLED TO, READ BACK BY AND VERIFIED WITH: KRISTYN DAVIS ON 04/17/21   SKL (NOTE) The GeneXpert MRSA Assay (FDA approved for NASAL specimens only), is one component of a comprehensive MRSA colonization surveillance program. It is not intended to diagnose MRSA infection nor to guide or monitor treatment for MRSA infections. Test performance is not FDA approved in patients less than 19 years old. Performed at [facility], [address] [place], [place] [address]       Radiology Studies: No results found.  Scheduled Meds:  aspirin EC  81 mg Oral Daily   clopidogrel  75 mg Oral Daily   enoxaparin (LOVENOX) injection  40 mg Subcutaneous Q24H   feeding supplement  1 Container Oral TID BM   gabapentin  600 mg Oral BID   linezolid  600 mg Oral Q12H   metoprolol succinate  25 mg Oral Daily   tamsulosin  0.4 mg Oral Daily   Continuous Infusions:  ampicillin-sulbactam (UNASYN) IV 3 g (04/21/21 1239)     LOS: 8 days   Time spent: 38 minutes. More than 50% of the time was spent in counseling/coordination of care  Arnetha Courser, MD [facility]  If 7PM-7AM, please contact night-coverage [email or web address]  04/21/2021, 3:32 PM   This record has been created using Conservation officer, historic buildings. Errors have been sought and corrected,but  may not always be located. Such creation errors do not reflect on the standard of care.

## 2021-04-22 DIAGNOSIS — R531 Weakness: Secondary | ICD-10-CM | POA: Diagnosis not present

## 2021-04-22 DIAGNOSIS — E43 Unspecified severe protein-calorie malnutrition: Secondary | ICD-10-CM | POA: Insufficient documentation

## 2021-04-22 DIAGNOSIS — J9601 Acute respiratory failure with hypoxia: Secondary | ICD-10-CM | POA: Diagnosis not present

## 2021-04-22 DIAGNOSIS — Z7189 Other specified counseling: Secondary | ICD-10-CM | POA: Diagnosis not present

## 2021-04-22 DIAGNOSIS — J690 Pneumonitis due to inhalation of food and vomit: Secondary | ICD-10-CM | POA: Diagnosis not present

## 2021-04-22 LAB — BASIC METABOLIC PANEL
Anion gap: 6 (ref 5–15)
BUN: 14 mg/dL (ref 8–23)
CO2: 31 mmol/L (ref 22–32)
Calcium: 8 mg/dL — ABNORMAL LOW (ref 8.9–10.3)
Chloride: 100 mmol/L (ref 98–111)
Creatinine, Ser: 0.59 mg/dL — ABNORMAL LOW (ref 0.61–1.24)
GFR, Estimated: >60 mL/min (ref >60)
Glucose, Bld: 115 mg/dL — ABNORMAL HIGH (ref 70–99)
Potassium: 3.8 mmol/L (ref 3.5–5.1)
Sodium: 137 mmol/L (ref 135–145)

## 2021-04-22 LAB — CBC
HCT: 31.7 % — ABNORMAL LOW (ref 39.0–52.0)
Hemoglobin: 10 g/dL — ABNORMAL LOW (ref 13.0–17.0)
MCH: 29.2 pg (ref 26.0–34.0)
MCHC: 31.5 g/dL (ref 30.0–36.0)
MCV: 92.4 fL (ref 80.0–100.0)
Platelets: 427 10*3/uL — ABNORMAL HIGH (ref 150–400)
RBC: 3.43 MIL/uL — ABNORMAL LOW (ref 4.22–5.81)
RDW: 14.6 % (ref 11.5–15.5)
WBC: 13.1 10*3/uL — ABNORMAL HIGH (ref 4.0–10.5)
nRBC: 0 % (ref 0.0–0.2)

## 2021-04-22 NOTE — Progress Notes (Signed)
PROGRESS NOTE    Jeffery Horne  QJJ:[id number] DOB: Apr 19, 1927 DOA: 04/12/2021 PCP: Marjie Skiff, NP   Brief Narrative: Taken from prior notes. Jeffery Horne is a 87 y.o. M with DM, vascular disease, and HTN who presented with few days weakness and progressive decrease in functional level, then a severe choking episode, confusion and hypoxia and cough.   At baseline the patient is able to transfer, do self cares, but over the last several days he has gotten weaker and weaker until he is unable to get up without assistance.  Then on the day of admission he developed a fever, had a prolonged severe choking episode, and appeared confused so 911 was called.  In the ER, CXR showed pneumonia.  Sepsis ruled out as there was no significant systemic response or organ failure noted.  Initially treated with Rocephin and a Zithromax. Continued to have fever, repeat CT chest on 04/17/2021 with some concern of necrotizing pneumonia and worsening of emphysema.  MRSA swab positive.  Currently on linezolid and Zosyn.  Discussed with radiology and interventional radiology.  There may be a small to moderate pleural effusion, but treatment of this would not improve the patient's oxygenation or breathing status, and at present there is no reason to think it would improve diagnostic yield given sputum cultures pending.  Given the risk of pneumothorax, which would likely be a life ending complication for this patient procedure was deferred.  Infectious disease was consulted on 04/19/2021 as continued to spike fever.  Sputum cultures growing MRSA and Candida albicans, final results pending.  Patient is very high risk for deterioration and death based on his age, current illness and prior comorbidities.  Palliative care was also consulted.  Subjective: Patient was seen and examined today.  Son-in-law at bedside.  No new complaints.  Currently afebrile.  Assessment & Plan:   Principal Problem:   Aspiration pneumonia  (HCC) Active Problems:   Hypertension associated with diabetes (HCC)   Type II diabetes mellitus with neurological manifestations (HCC)   Sepsis (HCC)   Generalized weakness   Protein-calorie malnutrition, severe  Right lower lobe possible aspiration pneumonia.  Initially treated with Rocephin and Zithromax and later switched to linezolid and Zosyn with poor response. Swallowed he was also consulted and apparently low risk for aspiration. CT chest done on 04/17/2021 with considerable consolidation, gaseous areas, possibly some necrotic pneumonia and worsening of emphysema.  MRSA PCR positive. Patient is not a candidate for VATS in case of an prior pneumonia or loculated effusion. ID was consulted and they switch zosyn with Unasyn, and recommending 4 weeks of Abx. Antibiotics can be switched to p.o. if remained afebrile for more than 48 hours.  Continue to have low-grade fever. -Continue with linezolid and Unasyn -Follow-up sputum cultures-with MRSA and rare Candida albicans. -Continue with aggressive pulmonary toilet -Continue with PT-recommending SNF placement.  If remained afebrile most likely leave earlier next week.  Will need a repeat COVID testing on Sunday, if remained afebrile for 48 hours.  Hypokalemia and hypomagnesemia.  Resolved.   -Replete electrolytes as needed and monitor  Hypertension, cardiovascular disease BP mildly elevated. - Continue aspirin, Plavix, metoprolol  Polyneuropathy - Continue gabapentin  BPH - Continue Flomax   Mild dementia Lives in ALF and will be going to SNF. - Hold mirtazapine-while patient is on linezolid to decrease the chance of serotonin syndrome  Diet controlled type 2 diabetes with polyneuropathy Glucose normal here  Acute metabolic encephalopathy Hospital delirium At baseline the patient  is oriented to person, place, and time.   At present, he has some continued waxing and waning inattention and disorientation   Severe protein  caloric malnutrition.  Evident by severe muscle wasting.  Apparently developed diarrhea with boost or Ensure. -Dietitian consult  Objective: Vitals:   04/22/21 0625 04/22/21 0800 04/22/21 1150 04/22/21 1602  BP:  (!) 149/65 130/78 (!) 154/72  Pulse: 71 63 63 84  Resp:  18 16 18   Temp:  98.6 F (37 C)  98.6 F (37 C)  TempSrc:      SpO2: (!) 88% 91% 93% 92%  Weight:      Height:        Intake/Output Summary (Last 24 hours) at 04/22/2021 1655 Last data filed at 04/22/2021 1500 Gross per 24 hour  Intake 840 ml  Output 1500 ml  Net -660 ml    Filed Weights   04/12/21 1523 04/13/21 0306  Weight: 59 kg 59 kg    Examination:  General.  Malnourished gentleman, in no acute distress. Pulmonary.  Lungs clear bilaterally, normal respiratory effort. CV.  Regular rate and rhythm, no JVD, rub or murmur. Abdomen.  Soft, nontender, nondistended, BS positive. CNS.  Alert and oriented .  No focal neurologic deficit. Extremities.  No edema, no cyanosis, pulses intact and symmetrical.  DVT prophylaxis: Lovenox Code Status: DNR Family Communication: Discussed with son-in-law at bedside. Disposition Plan:  Status is: Inpatient  Remains inpatient appropriate because:Inpatient level of care appropriate due to severity of illness  Dispo: The patient is from: SNF              Anticipated d/c is to:  SNF              Patient currently is not medically stable to d/c.   Difficult to place patient No              Level of care: Med-Surg  All the records are reviewed and case discussed with Care Management/Social Worker. Management plans discussed with the patient, nursing and they are in agreement.  Consultants:  Palliative care Infectious disease  Procedures:  Antimicrobials:  Linezolid Unasyn  Data Reviewed: I have personally reviewed following labs and imaging studies  CBC: Recent Labs  Lab 04/17/21 1559 04/19/21 0609 04/22/21 0548  WBC 9.5 12.2* 13.1*  HGB 9.6* 10.2*  10.0*  HCT 30.2* 32.2* 31.7*  MCV 93.8 91.2 92.4  PLT 248 314 427*    Basic Metabolic Panel: Recent Labs  Lab 04/17/21 1559 04/18/21 0616 04/19/21 0609 04/20/21 0605 04/22/21 0548  NA 137  --  138 139 137  K 3.5  --  3.3* 3.7 3.8  CL 101  --  98 102 100  CO2 31  --  31 34* 31  GLUCOSE 170*  --  130* 114* 115*  BUN 13  --  14 16 14   CREATININE 0.66 0.67 0.55* 0.63 0.59*  CALCIUM 7.7*  --  8.1* 7.7* 8.0*  MG  --   --  1.6* 2.0  --     GFR: Estimated Creatinine Clearance: 49.2 mL/min (A) (by C-G formula based on SCr of 0.59 mg/dL (L)). Liver Function Tests: No results for input(s): AST, ALT, ALKPHOS, BILITOT, PROT, ALBUMIN in the last 168 hours.  No results for input(s): LIPASE, AMYLASE in the last 168 hours. No results for input(s): AMMONIA in the last 168 hours. Coagulation Profile: No results for input(s): INR, PROTIME in the last 168 hours.  Cardiac Enzymes: No results for  input(s): CKTOTAL, CKMB, CKMBINDEX, TROPONINI in the last 168 hours. BNP (last 3 results) No results for input(s): PROBNP in the last 8760 hours. HbA1C: No results for input(s): HGBA1C in the last 72 hours. CBG: No results for input(s): GLUCAP in the last 168 hours. Lipid Profile: No results for input(s): CHOL, HDL, LDLCALC, TRIG, CHOLHDL, LDLDIRECT in the last 72 hours. Thyroid Function Tests: No results for input(s): TSH, T4TOTAL, FREET4, T3FREE, THYROIDAB in the last 72 hours. Anemia Panel: No results for input(s): VITAMINB12, FOLATE, FERRITIN, TIBC, IRON, RETICCTPCT in the last 72 hours. Sepsis Labs: No results for input(s): PROCALCITON, LATICACIDVEN in the last 168 hours.   Recent Results (from the past 240 hour(s))  Culture, blood (single)     Status: None   Collection Time: 04/13/21 12:06 AM   Specimen: BLOOD RIGHT FOREARM  Result Value Ref Range Status   Specimen Description BLOOD RIGHT FOREARM  Final   Special Requests   Final    BOTTLES DRAWN AEROBIC AND ANAEROBIC Blood Culture  adequate volume   Culture   Final    NO GROWTH 5 DAYS Performed at [facility], [address] [place], [place] [address]    Report Status 04/18/2021 FINAL  Final  Expectorated Sputum Assessment w Gram Stain, Rflx to Resp Cult     Status: None   Collection Time: 04/17/21  1:18 PM   Specimen: Sputum  Result Value Ref Range Status   Specimen Description SPUTUM  Final   Special Requests NONE  Final   Sputum evaluation   Final    Sputum specimen not acceptable for testing.  Please recollect.   Performed at [facility], [address] [place], [place] [address]    Report Status 04/17/2021 FINAL  Final  Expectorated Sputum Assessment w Gram Stain, Rflx to Resp Cult     Status: None   Collection Time: 04/17/21  3:15 PM   Specimen: Sputum  Result Value Ref Range Status   Specimen Description SPU  Final   Special Requests NONE  Final   Sputum evaluation   Final    THIS SPECIMEN IS ACCEPTABLE FOR SPUTUM CULTURE Performed at [facility], [address], [place], [place] [address]    Report Status 04/17/2021 FINAL  Final  Culture, Respiratory w Gram Stain     Status: None   Collection Time: 04/17/21  3:15 PM   Specimen: Sputum  Result Value Ref Range Status   Specimen Description   Final    SPU Performed at [facility], [address], [place], [place] [address]    Special Requests   Final    NONE Reflexed from [id number] Performed at [facility], [address] [place], [place] [address]    Gram Stain   Final    ABUNDANT SQUAMOUS EPITHELIAL CELLS PRESENT RARE WBC PRESENT, PREDOMINANTLY PMN RARE YEAST Performed at [facility], [address] [place], [place] [address]    Culture   Final    RARE CANDIDA ALBICANS RARE METHICILLIN RESISTANT STAPHYLOCOCCUS AUREUS    Report Status 04/21/2021 FINAL  Final   Organism ID, Bacteria METHICILLIN RESISTANT STAPHYLOCOCCUS AUREUS  Final      Susceptibility   Methicillin  resistant staphylococcus aureus - MIC*    CIPROFLOXACIN >=8 RESISTANT Resistant     ERYTHROMYCIN >=8 RESISTANT Resistant     GENTAMICIN <=0.5 SENSITIVE Sensitive     OXACILLIN >=4 RESISTANT Resistant     TETRACYCLINE <=1 SENSITIVE Sensitive  VANCOMYCIN 1 SENSITIVE Sensitive     TRIMETH/SULFA >=320 RESISTANT Resistant     CLINDAMYCIN <=0.25 SENSITIVE Sensitive     RIFAMPIN <=0.5 SENSITIVE Sensitive     Inducible Clindamycin NEGATIVE Sensitive     * RARE METHICILLIN RESISTANT STAPHYLOCOCCUS AUREUS  MRSA Next Gen by PCR, Nasal     Status: Abnormal   Collection Time: 04/17/21  5:14 PM   Specimen: Nasal Mucosa; Nasal Swab  Result Value Ref Range Status   MRSA by PCR Next Gen DETECTED (A) NOT DETECTED Final    Comment: RESULT CALLED TO, READ BACK BY AND VERIFIED WITH: KRISTYN DAVIS ON 04/17/21  @1906  SKL (NOTE) The GeneXpert MRSA Assay (FDA approved for NASAL specimens only), is one component of a comprehensive MRSA colonization surveillance program. It is not intended to diagnose MRSA infection nor to guide or monitor treatment for MRSA infections. Test performance is not FDA approved in patients less than 75 years old. Performed at [facility], [address], [place], [place]       Radiology Studies: No results found.  Scheduled Meds:  aspirin EC  81 mg Oral Daily   clopidogrel  75 mg Oral Daily   enoxaparin (LOVENOX) injection  40 mg Subcutaneous Q24H   gabapentin  600 mg Oral BID   linezolid  600 mg Oral Q12H   metoprolol succinate  25 mg Oral Daily   tamsulosin  0.4 mg Oral Daily   Continuous Infusions:  ampicillin-sulbactam (UNASYN) IV 3 g (04/22/21 1513)     LOS: 9 days   Time spent: 35 minutes. More than 50% of the time was spent in counseling/coordination of care  04/24/21, MD [facility]  If 7PM-7AM, please contact night-coverage [email or web address]  04/22/2021, 4:55 PM   This record has been created using 04/24/2021. Errors have been sought and corrected,but may not always be located. Such creation errors do not reflect on the standard of care.

## 2021-04-23 DIAGNOSIS — Z7189 Other specified counseling: Secondary | ICD-10-CM | POA: Diagnosis not present

## 2021-04-23 DIAGNOSIS — J690 Pneumonitis due to inhalation of food and vomit: Secondary | ICD-10-CM | POA: Diagnosis not present

## 2021-04-23 DIAGNOSIS — R531 Weakness: Secondary | ICD-10-CM | POA: Diagnosis not present

## 2021-04-23 DIAGNOSIS — J9601 Acute respiratory failure with hypoxia: Secondary | ICD-10-CM | POA: Diagnosis not present

## 2021-04-23 LAB — CREATININE, SERUM
Creatinine, Ser: 0.68 mg/dL (ref 0.61–1.24)
GFR, Estimated: >60 mL/min (ref >60)

## 2021-04-23 MED ORDER — RISAQUAD PO CAPS
1.0000 | ORAL_CAPSULE | Freq: Every day | ORAL | Status: DC
  Administered 2021-04-23 – 2021-04-25 (×3): 1 via ORAL
  Filled 2021-04-23 (×3): qty 1

## 2021-04-23 MED ORDER — LOPERAMIDE HCL 2 MG PO CAPS
2.0000 mg | ORAL_CAPSULE | ORAL | Status: DC | PRN
  Administered 2021-04-23 – 2021-04-24 (×2): 2 mg via ORAL
  Filled 2021-04-23 (×2): qty 1

## 2021-04-23 NOTE — TOC Progression Note (Signed)
Transition of Care [facility]) - Progression Note    Patient Details  Name: Jeffery Horne MRN: [id number] Date of Birth: [birth year]  Transition of Care [facility]) CM/SW Contact  Bing Quarry, RN Phone Number: 04/23/2021, 11:58 AM  Clinical Narrative:  Progression rounds: Low grade fevers per Dr. Nelson Chimes. ID consult: "Necrotizing pneumonia on the right lung secondary to aspiration and superadded MRSA infection" .  [facility] accepted. All others remain pending as of today.  Gabriel Cirri RN CM    Expected Discharge Plan: Skilled Nursing Facility Barriers to Discharge: Continued Medical Work up  Expected Discharge Plan and Services Expected Discharge Plan: Skilled Nursing Facility   Discharge Planning Services: CM Consult   Living arrangements for the past 2 months: Assisted Living Facility Expected Discharge Date: 04/15/21               DME Arranged: N/A DME Agency: NA       HH Arranged: NA HH Agency: NA         Social Determinants of Health (SDOH) Interventions    Readmission Risk Interventions No flowsheet data found.

## 2021-04-23 NOTE — Progress Notes (Signed)
PROGRESS NOTE    Jeffery Horne  ZOX:[id number] DOB: [birth year] DOA: 04/12/2021 PCP: Marjie Skiffannady, Jolene T, NP   Brief Narrative: Taken from prior notes. Jeffery Horne is a 87 y.o. M with DM, vascular disease, and HTN who presented with few days weakness and progressive decrease in functional level, then a severe choking episode, confusion and hypoxia and cough.   At baseline the patient is able to transfer, do self cares, but over the last several days he has gotten weaker and weaker until he is unable to get up without assistance.  Then on the day of admission he developed a fever, had a prolonged severe choking episode, and appeared confused so 911 was called.  In the ER, CXR showed pneumonia.  Sepsis ruled out as there was no significant systemic response or organ failure noted.  Initially treated with Rocephin and a Zithromax. Continued to have fever, repeat CT chest on 04/17/2021 with some concern of necrotizing pneumonia and worsening of emphysema.  MRSA swab positive.  Currently on linezolid and Zosyn.  Discussed with radiology and interventional radiology.  There may be a small to moderate pleural effusion, but treatment of this would not improve the patient's oxygenation or breathing status, and at present there is no reason to think it would improve diagnostic yield given sputum cultures pending.  Given the risk of pneumothorax, which would likely be a life ending complication for this patient procedure was deferred.  Infectious disease was consulted on 04/19/2021 as continued to spike fever.  Sputum cultures growing MRSA and Candida albicans, final results pending.  Patient is very high risk for deterioration and death based on his age, current illness and prior comorbidities.  Palliative care was also consulted.  Subjective: Patient was having some diarrhea for the past couple of days, had some accident with swelling of her close this morning.  Denies any abdominal pain.  No nausea or  vomiting.  Daughter at bedside. Had 1 episode of low-grade fever at 100 yesterday around 8:30 PM.  Assessment & Plan:   Principal Problem:   Aspiration pneumonia (HCC) Active Problems:   Hypertension associated with diabetes (HCC)   Type II diabetes mellitus with neurological manifestations (HCC)   Sepsis (HCC)   Generalized weakness   Protein-calorie malnutrition, severe  Right lower lobe possible aspiration pneumonia.  Initially treated with Rocephin and Zithromax and later switched to linezolid and Zosyn with poor response. Swallowed he was also consulted and apparently low risk for aspiration. CT chest done on 04/17/2021 with considerable consolidation, gaseous areas, possibly some necrotic pneumonia and worsening of emphysema.  MRSA PCR positive. Patient is not a candidate for VATS in case of an prior pneumonia or loculated effusion. ID was consulted and they switch zosyn with Unasyn, and recommending 4 weeks of Abx. Antibiotics can be switched to p.o. if remained afebrile for more than 48 hours.  Continue to have low-grade fever. -Continue with linezolid and Unasyn -Follow-up sputum cultures-with MRSA and rare Candida albicans. -Continue with aggressive pulmonary toilet -Continue with PT-recommending SNF placement.  If remained afebrile most likely leave earlier next week.   Diarrhea.  Apparently having 2-3 episodes of loose bowel movements daily for the past couple of days.  Denies any abdominal pain.  Does not look toxic.  May be due to antibiotic use.  Does not look like C. difficile at this point but he will remain high risk. -Monitor for leukocytosis. -Low threshold to check for C. difficile if remains symptomatic or getting worse with  worsening of leukocytosis.  Hypokalemia and hypomagnesemia.  Resolved.   -Replete electrolytes as needed and monitor  Hypertension, cardiovascular disease BP mildly elevated. - Continue aspirin, Plavix, metoprolol  Polyneuropathy -  Continue gabapentin  BPH - Continue Flomax   Mild dementia Lives in ALF and will be going to SNF. - Hold mirtazapine-while patient is on linezolid to decrease the chance of serotonin syndrome  Diet controlled type 2 diabetes with polyneuropathy Glucose normal here  Acute metabolic encephalopathy Hospital delirium At baseline the patient is oriented to person, place, and time.   At present, he has some continued waxing and waning inattention and disorientation   Severe protein caloric malnutrition.  Evident by severe muscle wasting.  Apparently developed diarrhea with boost or Ensure. -Dietitian consult  Objective: Vitals:   04/22/21 2359 04/23/21 0519 04/23/21 0806 04/23/21 1435  BP: (!) 134/46 128/60 (!) 137/50   Pulse: 67 62 61   Resp: 18 18 18    Temp: 99.2 F (37.3 C) 99 F (37.2 C) 98.2 F (36.8 C)   TempSrc:      SpO2:  90% (!) 86% 95%  Weight:      Height:        Intake/Output Summary (Last 24 hours) at 04/23/2021 1553 Last data filed at 04/23/2021 0519 Gross per 24 hour  Intake 1421.66 ml  Output 1400 ml  Net 21.66 ml    Filed Weights   04/12/21 1523 04/13/21 0306  Weight: 59 kg 59 kg    Examination:  General.  Frail and malnourished gentleman, in no acute distress. Pulmonary.  Lungs clear bilaterally, normal respiratory effort. CV.  Regular rate and rhythm, no JVD, rub or murmur. Abdomen.  Soft, nontender, nondistended, BS positive. CNS.  Alert and oriented .  No focal neurologic deficit. Extremities.  No edema, no cyanosis, pulses intact and symmetrical. Psychiatry.  Judgment and insight appears normal.   DVT prophylaxis: Lovenox Code Status: DNR Family Communication: Discussed with daughter at bedside. Disposition Plan:  Status is: Inpatient  Remains inpatient appropriate because:Inpatient level of care appropriate due to severity of illness  Dispo: The patient is from: SNF              Anticipated d/c is to:  SNF              Patient  currently is not medically stable to d/c.   Difficult to place patient No              Level of care: Med-Surg  All the records are reviewed and case discussed with Care Management/Social Worker. Management plans discussed with the patient, nursing and they are in agreement.  Consultants:  Palliative care Infectious disease  Procedures:  Antimicrobials:  Linezolid Unasyn  Data Reviewed: I have personally reviewed following labs and imaging studies  CBC: Recent Labs  Lab 04/17/21 1559 04/19/21 0609 04/22/21 0548  WBC 9.5 12.2* 13.1*  HGB 9.6* 10.2* 10.0*  HCT 30.2* 32.2* 31.7*  MCV 93.8 91.2 92.4  PLT 248 314 427*    Basic Metabolic Panel: Recent Labs  Lab 04/17/21 1559 04/18/21 0616 04/19/21 0609 04/20/21 0605 04/22/21 0548 04/22/21 2340  NA 137  --  138 139 137  --   K 3.5  --  3.3* 3.7 3.8  --   CL 101  --  98 102 100  --   CO2 31  --  31 34* 31  --   GLUCOSE 170*  --  130* 114* 115*  --  BUN 13  --  14 16 14   --   CREATININE 0.66 0.67 0.55* 0.63 0.59* 0.68  CALCIUM 7.7*  --  8.1* 7.7* 8.0*  --   MG  --   --  1.6* 2.0  --   --     GFR: Estimated Creatinine Clearance: 49.2 mL/min (by C-G formula based on SCr of 0.68 mg/dL). Liver Function Tests: No results for input(s): AST, ALT, ALKPHOS, BILITOT, PROT, ALBUMIN in the last 168 hours.  No results for input(s): LIPASE, AMYLASE in the last 168 hours. No results for input(s): AMMONIA in the last 168 hours. Coagulation Profile: No results for input(s): INR, PROTIME in the last 168 hours.  Cardiac Enzymes: No results for input(s): CKTOTAL, CKMB, CKMBINDEX, TROPONINI in the last 168 hours. BNP (last 3 results) No results for input(s): PROBNP in the last 8760 hours. HbA1C: No results for input(s): HGBA1C in the last 72 hours. CBG: No results for input(s): GLUCAP in the last 168 hours. Lipid Profile: No results for input(s): CHOL, HDL, LDLCALC, TRIG, CHOLHDL, LDLDIRECT in the last 72 hours. Thyroid  Function Tests: No results for input(s): TSH, T4TOTAL, FREET4, T3FREE, THYROIDAB in the last 72 hours. Anemia Panel: No results for input(s): VITAMINB12, FOLATE, FERRITIN, TIBC, IRON, RETICCTPCT in the last 72 hours. Sepsis Labs: No results for input(s): PROCALCITON, LATICACIDVEN in the last 168 hours.   Recent Results (from the past 240 hour(s))  Expectorated Sputum Assessment w Gram Stain, Rflx to Resp Cult     Status: None   Collection Time: 04/17/21  1:18 PM   Specimen: Sputum  Result Value Ref Range Status   Specimen Description SPUTUM  Final   Special Requests NONE  Final   Sputum evaluation   Final    Sputum specimen not acceptable for testing.  Please recollect.   Performed at [facility], [address] [place], [place]    Report Status 04/17/2021 FINAL  Final  Expectorated Sputum Assessment w Gram Stain, Rflx to Resp Cult     Status: None   Collection Time: 04/17/21  3:15 PM   Specimen: Sputum  Result Value Ref Range Status   Specimen Description SPU  Final   Special Requests NONE  Final   Sputum evaluation   Final    THIS SPECIMEN IS ACCEPTABLE FOR SPUTUM CULTURE Performed at [facility], [address], [place], [place]    Report Status 04/17/2021 FINAL  Final  Culture, Respiratory w Gram Stain     Status: None   Collection Time: 04/17/21  3:15 PM   Specimen: Sputum  Result Value Ref Range Status   Specimen Description   Final    SPU Performed at [facility], [address], [place], [place]    Special Requests   Final    NONE Reflexed from [id number] Performed at [facility], [address] [place], [place]    Gram Stain   Final    ABUNDANT SQUAMOUS EPITHELIAL CELLS PRESENT RARE WBC PRESENT, PREDOMINANTLY PMN RARE YEAST Performed at [facility], [address] [place], [place]    Culture   Final    RARE CANDIDA ALBICANS RARE METHICILLIN RESISTANT  STAPHYLOCOCCUS AUREUS    Report Status 04/21/2021 FINAL  Final   Organism ID, Bacteria METHICILLIN RESISTANT STAPHYLOCOCCUS AUREUS  Final      Susceptibility   Methicillin resistant staphylococcus aureus - MIC*    CIPROFLOXACIN >=8 RESISTANT Resistant  ERYTHROMYCIN >=8 RESISTANT Resistant     GENTAMICIN <=0.5 SENSITIVE Sensitive     OXACILLIN >=4 RESISTANT Resistant     TETRACYCLINE <=1 SENSITIVE Sensitive     VANCOMYCIN 1 SENSITIVE Sensitive     TRIMETH/SULFA >=320 RESISTANT Resistant     CLINDAMYCIN <=0.25 SENSITIVE Sensitive     RIFAMPIN <=0.5 SENSITIVE Sensitive     Inducible Clindamycin NEGATIVE Sensitive     * RARE METHICILLIN RESISTANT STAPHYLOCOCCUS AUREUS  MRSA Next Gen by PCR, Nasal     Status: Abnormal   Collection Time: 04/17/21  5:14 PM   Specimen: Nasal Mucosa; Nasal Swab  Result Value Ref Range Status   MRSA by PCR Next Gen DETECTED (A) NOT DETECTED Final    Comment: RESULT CALLED TO, READ BACK BY AND VERIFIED WITH: KRISTYN DAVIS ON 04/17/21  @1906  SKL (NOTE) The GeneXpert MRSA Assay (FDA approved for NASAL specimens only), is one component of a comprehensive MRSA colonization surveillance program. It is not intended to diagnose MRSA infection nor to guide or monitor treatment for MRSA infections. Test performance is not FDA approved in patients less than 101 years old. Performed at [facility], [address], [place], [place]       Radiology Studies: No results found.  Scheduled Meds:  acidophilus  1 capsule Oral Daily   aspirin EC  81 mg Oral Daily   clopidogrel  75 mg Oral Daily   enoxaparin (LOVENOX) injection  40 mg Subcutaneous Q24H   gabapentin  600 mg Oral BID   linezolid  600 mg Oral Q12H   metoprolol succinate  25 mg Oral Daily   tamsulosin  0.4 mg Oral Daily   Continuous Infusions:  ampicillin-sulbactam (UNASYN) IV 3 g (04/23/21 1129)     LOS: 10 days   Time spent: 36 minutes. More than 50% of the time was spent  in counseling/coordination of care  04/25/21, MD [facility]  If 7PM-7AM, please contact night-coverage [email or web address]  04/23/2021, 3:53 PM   This record has been created using 04/25/2021. Errors have been sought and corrected,but may not always be located. Such creation errors do not reflect on the standard of care.

## 2021-04-24 DIAGNOSIS — R531 Weakness: Secondary | ICD-10-CM | POA: Diagnosis not present

## 2021-04-24 DIAGNOSIS — J15212 Pneumonia due to Methicillin resistant Staphylococcus aureus: Secondary | ICD-10-CM | POA: Diagnosis not present

## 2021-04-24 DIAGNOSIS — J9601 Acute respiratory failure with hypoxia: Secondary | ICD-10-CM | POA: Diagnosis not present

## 2021-04-24 DIAGNOSIS — Z7189 Other specified counseling: Secondary | ICD-10-CM | POA: Diagnosis not present

## 2021-04-24 DIAGNOSIS — J690 Pneumonitis due to inhalation of food and vomit: Secondary | ICD-10-CM | POA: Diagnosis not present

## 2021-04-24 LAB — CBC
HCT: 31.2 % — ABNORMAL LOW (ref 39.0–52.0)
Hemoglobin: 9.9 g/dL — ABNORMAL LOW (ref 13.0–17.0)
MCH: 29.2 pg (ref 26.0–34.0)
MCHC: 31.7 g/dL (ref 30.0–36.0)
MCV: 92 fL (ref 80.0–100.0)
Platelets: 439 10*3/uL — ABNORMAL HIGH (ref 150–400)
RBC: 3.39 MIL/uL — ABNORMAL LOW (ref 4.22–5.81)
RDW: 14.5 % (ref 11.5–15.5)
WBC: 13.8 10*3/uL — ABNORMAL HIGH (ref 4.0–10.5)
nRBC: 0 % (ref 0.0–0.2)

## 2021-04-24 LAB — BASIC METABOLIC PANEL
Anion gap: 7 (ref 5–15)
BUN: 14 mg/dL (ref 8–23)
CO2: 29 mmol/L (ref 22–32)
Calcium: 8 mg/dL — ABNORMAL LOW (ref 8.9–10.3)
Chloride: 101 mmol/L (ref 98–111)
Creatinine, Ser: 0.62 mg/dL (ref 0.61–1.24)
GFR, Estimated: >60 mL/min (ref >60)
Glucose, Bld: 110 mg/dL — ABNORMAL HIGH (ref 70–99)
Potassium: 4 mmol/L (ref 3.5–5.1)
Sodium: 137 mmol/L (ref 135–145)

## 2021-04-24 MED ORDER — AMOXICILLIN-POT CLAVULANATE 875-125 MG PO TABS
1.0000 | ORAL_TABLET | Freq: Two times a day (BID) | ORAL | Status: DC
  Administered 2021-04-24 – 2021-04-25 (×2): 1 via ORAL
  Filled 2021-04-24 (×2): qty 1

## 2021-04-24 MED ORDER — ZINC OXIDE 40 % EX OINT
TOPICAL_OINTMENT | Freq: Two times a day (BID) | CUTANEOUS | Status: DC
  Filled 2021-04-24: qty 113

## 2021-04-24 MED ORDER — MUPIROCIN 2 % EX OINT
1.0000 "application " | TOPICAL_OINTMENT | Freq: Two times a day (BID) | CUTANEOUS | Status: DC
  Administered 2021-04-24 – 2021-04-25 (×3): 1 via NASAL
  Filled 2021-04-24: qty 22

## 2021-04-24 MED ORDER — CHLORHEXIDINE GLUCONATE CLOTH 2 % EX PADS
6.0000 | MEDICATED_PAD | Freq: Every day | CUTANEOUS | Status: DC
  Administered 2021-04-25: 05:00:00 6 via TOPICAL

## 2021-04-24 MED ORDER — NYSTATIN 100000 UNIT/GM EX POWD
Freq: Two times a day (BID) | CUTANEOUS | Status: DC
  Filled 2021-04-24: qty 15

## 2021-04-24 NOTE — Progress Notes (Signed)
PROGRESS NOTE    Jeffery Horne  ZOX:[id number] DOB: [birth year] DOA: 04/12/2021 PCP: Marjie Skiffannady, Jolene T, NP   Brief Narrative: Taken from prior notes. Jeffery Horne is a 87 y.o. M with DM, vascular disease, and HTN who presented with few days weakness and progressive decrease in functional level, then a severe choking episode, confusion and hypoxia and cough.   At baseline the patient is able to transfer, do self cares, but over the last several days he has gotten weaker and weaker until he is unable to get up without assistance.  Then on the day of admission he developed a fever, had a prolonged severe choking episode, and appeared confused so 911 was called.  In the ER, CXR showed pneumonia.  Sepsis ruled out as there was no significant systemic response or organ failure noted.  Initially treated with Rocephin and a Zithromax. Continued to have fever, repeat CT chest on 04/17/2021 with some concern of necrotizing pneumonia and worsening of emphysema.  MRSA swab positive.  Currently on linezolid and Zosyn.  Discussed with radiology and interventional radiology.  There may be a small to moderate pleural effusion, but treatment of this would not improve the patient's oxygenation or breathing status, and at present there is no reason to think it would improve diagnostic yield given sputum cultures pending.  Given the risk of pneumothorax, which would likely be a life ending complication for this patient procedure was deferred.  Infectious disease was consulted on 04/19/2021 as continued to spike fever.  Sputum cultures growing MRSA and Candida albicans, final results pending.  Patient is very high risk for deterioration and death based on his age, current illness and prior comorbidities.  Palliative care was also consulted.  Subjective: Patient denies any more diarrhea.  Remained afebrile over the past 24 hours.  No new complaints.  He did ate some of his breakfast.  Son-in-law at bedside.  Assessment  & Plan:   Principal Problem:   Aspiration pneumonia (HCC) Active Problems:   Hypertension associated with diabetes (HCC)   Type II diabetes mellitus with neurological manifestations (HCC)   Sepsis (HCC)   Generalized weakness   Protein-calorie malnutrition, severe  Right lower lobe possible aspiration pneumonia.  Initially treated with Rocephin and Zithromax and later switched to linezolid and Zosyn with poor response. Swallowed he was also consulted and apparently low risk for aspiration. CT chest done on 04/17/2021 with considerable consolidation, gaseous areas, possibly some necrotic pneumonia and worsening of emphysema.  MRSA PCR positive. Patient is not a candidate for VATS in case of an prior pneumonia or loculated effusion. ID was consulted and they switch zosyn with Unasyn, and recommending 4 weeks of Abx. Antibiotics can be switched to p.o. if remained afebrile for more than 48 hours.  Currently afebrile over the past 24 hours.  Persistent leukocytosis. -Continue with linezolid and Unasyn -Follow-up sputum cultures-with MRSA and rare Candida albicans. -Continue with aggressive pulmonary toilet -Continue with PT-recommending SNF placement.  Most likely on Wednesday or Thursday if remained afebrile and successfully switched to p.o. antibiotics.  Diarrhea.  Said no diarrhea today. Apparently having 2-3 episodes of loose bowel movements daily for the past couple of days.  Denies any abdominal pain.  Does not look toxic.  May be due to antibiotic use.  Does not look like C. difficile at this point but he will remain high risk. -Monitor for leukocytosis. -Low threshold to check for C. difficile if remains symptomatic or getting worse with worsening of leukocytosis.  Hypokalemia and hypomagnesemia.  Resolved.   -Replete electrolytes as needed and monitor  Hypertension, cardiovascular disease BP mildly elevated. - Continue aspirin, Plavix, metoprolol  Polyneuropathy - Continue  gabapentin  BPH - Continue Flomax   Mild dementia Lives in ALF and will be going to SNF. - Hold mirtazapine-while patient is on linezolid to decrease the chance of serotonin syndrome  Diet controlled type 2 diabetes with polyneuropathy Glucose normal here  Acute metabolic encephalopathy Hospital delirium At baseline the patient is oriented to person, place, and time.   At present, he has some continued waxing and waning inattention and disorientation   Severe protein caloric malnutrition.  Evident by severe muscle wasting.  Apparently developed diarrhea with boost or Ensure. -Dietitian consult  Objective: Vitals:   04/23/21 2127 04/24/21 0530 04/24/21 0724 04/24/21 1533  BP: (!) 114/55 135/61 (!) 164/59 (!) 143/57  Pulse: 63 68 62 68  Resp: 16 18 16 18   Temp: 98.5 F (36.9 C) 98.3 F (36.8 C) 98.1 F (36.7 C) 98.3 F (36.8 C)  TempSrc: Oral Oral    SpO2: 94% 91% 90% 92%  Weight:      Height:        Intake/Output Summary (Last 24 hours) at 04/24/2021 1617 Last data filed at 04/24/2021 0716 Gross per 24 hour  Intake --  Output 700 ml  Net -700 ml    Filed Weights   04/12/21 1523 04/13/21 0306  Weight: 59 kg 59 kg    Examination:  General.  Malnourished elderly man, in no acute distress. Pulmonary.  Lungs clear bilaterally, normal respiratory effort. CV.  Regular rate and rhythm, no JVD, rub or murmur. Abdomen.  Soft, nontender, nondistended, BS positive. CNS.  Alert and oriented .  No focal neurologic deficit. Extremities.  No edema, no cyanosis, pulses intact and symmetrical. Psychiatry.  Judgment and insight appears normal.   DVT prophylaxis: Lovenox Code Status: DNR Family Communication: Discussed with son-in-law at bedside. Disposition Plan:  Status is: Inpatient  Remains inpatient appropriate because:Inpatient level of care appropriate due to severity of illness  Dispo: The patient is from: SNF              Anticipated d/c is to:  SNF               Patient currently is not medically stable to d/c.   Difficult to place patient No              Level of care: Med-Surg  All the records are reviewed and case discussed with Care Management/Social Worker. Management plans discussed with the patient, nursing and they are in agreement.  Consultants:  Palliative care Infectious disease  Procedures:  Antimicrobials:  Linezolid Unasyn  Data Reviewed: I have personally reviewed following labs and imaging studies  CBC: Recent Labs  Lab 04/19/21 0609 04/22/21 0548 04/24/21 0704  WBC 12.2* 13.1* 13.8*  HGB 10.2* 10.0* 9.9*  HCT 32.2* 31.7* 31.2*  MCV 91.2 92.4 92.0  PLT 314 427* 439*    Basic Metabolic Panel: Recent Labs  Lab 04/19/21 0609 04/20/21 0605 04/22/21 0548 04/22/21 2340 04/24/21 0704  NA 138 139 137  --  137  K 3.3* 3.7 3.8  --  4.0  CL 98 102 100  --  101  CO2 31 34* 31  --  29  GLUCOSE 130* 114* 115*  --  110*  BUN 14 16 14   --  14  CREATININE 0.55* 0.63 0.59* 0.68 0.62  CALCIUM 8.1* 7.7*  8.0*  --  8.0*  MG 1.6* 2.0  --   --   --     GFR: Estimated Creatinine Clearance: 49.2 mL/min (by C-G formula based on SCr of 0.62 mg/dL). Liver Function Tests: No results for input(s): AST, ALT, ALKPHOS, BILITOT, PROT, ALBUMIN in the last 168 hours.  No results for input(s): LIPASE, AMYLASE in the last 168 hours. No results for input(s): AMMONIA in the last 168 hours. Coagulation Profile: No results for input(s): INR, PROTIME in the last 168 hours.  Cardiac Enzymes: No results for input(s): CKTOTAL, CKMB, CKMBINDEX, TROPONINI in the last 168 hours. BNP (last 3 results) No results for input(s): PROBNP in the last 8760 hours. HbA1C: No results for input(s): HGBA1C in the last 72 hours. CBG: No results for input(s): GLUCAP in the last 168 hours. Lipid Profile: No results for input(s): CHOL, HDL, LDLCALC, TRIG, CHOLHDL, LDLDIRECT in the last 72 hours. Thyroid Function Tests: No results for input(s): TSH,  T4TOTAL, FREET4, T3FREE, THYROIDAB in the last 72 hours. Anemia Panel: No results for input(s): VITAMINB12, FOLATE, FERRITIN, TIBC, IRON, RETICCTPCT in the last 72 hours. Sepsis Labs: No results for input(s): PROCALCITON, LATICACIDVEN in the last 168 hours.   Recent Results (from the past 240 hour(s))  Expectorated Sputum Assessment w Gram Stain, Rflx to Resp Cult     Status: None   Collection Time: 04/17/21  1:18 PM   Specimen: Sputum  Result Value Ref Range Status   Specimen Description SPUTUM  Final   Special Requests NONE  Final   Sputum evaluation   Final    Sputum specimen not acceptable for testing.  Please recollect.   Performed at [facility], [address] [place], [place] [address]    Report Status 04/17/2021 FINAL  Final  Expectorated Sputum Assessment w Gram Stain, Rflx to Resp Cult     Status: None   Collection Time: 04/17/21  3:15 PM   Specimen: Sputum  Result Value Ref Range Status   Specimen Description SPU  Final   Special Requests NONE  Final   Sputum evaluation   Final    THIS SPECIMEN IS ACCEPTABLE FOR SPUTUM CULTURE Performed at [facility], [address], [place], [place] [address]    Report Status 04/17/2021 FINAL  Final  Culture, Respiratory w Gram Stain     Status: None   Collection Time: 04/17/21  3:15 PM   Specimen: Sputum  Result Value Ref Range Status   Specimen Description   Final    SPU Performed at [facility], [address], [place], [place] [address]    Special Requests   Final    NONE Reflexed from [id number] Performed at [facility], [address] [place], [place] [address]    Gram Stain   Final    ABUNDANT SQUAMOUS EPITHELIAL CELLS PRESENT RARE WBC PRESENT, PREDOMINANTLY PMN RARE YEAST Performed at [facility], [address] [place], [place] [address]    Culture   Final    RARE CANDIDA ALBICANS RARE METHICILLIN RESISTANT STAPHYLOCOCCUS AUREUS    Report Status 04/21/2021  FINAL  Final   Organism ID, Bacteria METHICILLIN RESISTANT STAPHYLOCOCCUS AUREUS  Final      Susceptibility   Methicillin resistant staphylococcus aureus - MIC*    CIPROFLOXACIN >=8 RESISTANT Resistant     ERYTHROMYCIN >=8 RESISTANT Resistant     GENTAMICIN <=0.5 SENSITIVE Sensitive     OXACILLIN >=4 RESISTANT Resistant     TETRACYCLINE <=1 SENSITIVE  Sensitive     VANCOMYCIN 1 SENSITIVE Sensitive     TRIMETH/SULFA >=320 RESISTANT Resistant     CLINDAMYCIN <=0.25 SENSITIVE Sensitive     RIFAMPIN <=0.5 SENSITIVE Sensitive     Inducible Clindamycin NEGATIVE Sensitive     * RARE METHICILLIN RESISTANT STAPHYLOCOCCUS AUREUS  MRSA Next Gen by PCR, Nasal     Status: Abnormal   Collection Time: 04/17/21  5:14 PM   Specimen: Nasal Mucosa; Nasal Swab  Result Value Ref Range Status   MRSA by PCR Next Gen DETECTED (A) NOT DETECTED Final    Comment: RESULT CALLED TO, READ BACK BY AND VERIFIED WITH: KRISTYN DAVIS ON 04/17/21  @1906  SKL (NOTE) The GeneXpert MRSA Assay (FDA approved for NASAL specimens only), is one component of a comprehensive MRSA colonization surveillance program. It is not intended to diagnose MRSA infection nor to guide or monitor treatment for MRSA infections. Test performance is not FDA approved in patients less than 61 years old. Performed at [facility], [address], [place], [place]       Radiology Studies: No results found.  Scheduled Meds:  acidophilus  1 capsule Oral Daily   aspirin EC  81 mg Oral Daily   [START ON 04/25/2021] Chlorhexidine Gluconate Cloth  6 each Topical Q0600   clopidogrel  75 mg Oral Daily   enoxaparin (LOVENOX) injection  40 mg Subcutaneous Q24H   gabapentin  600 mg Oral BID   linezolid  600 mg Oral Q12H   liver oil-zinc oxide   Topical BID   metoprolol succinate  25 mg Oral Daily   mupirocin ointment  1 application Nasal BID   nystatin   Topical BID   tamsulosin  0.4 mg Oral Daily   Continuous Infusions:   ampicillin-sulbactam (UNASYN) IV 3 g (04/24/21 0946)     LOS: 11 days   Time spent: 33 minutes. More than 50% of the time was spent in counseling/coordination of care  04/26/21, MD [facility]  If 7PM-7AM, please contact night-coverage [email or web address]  04/24/2021, 4:17 PM   This record has been created using 04/26/2021. Errors have been sought and corrected,but may not always be located. Such creation errors do not reflect on the standard of care.

## 2021-04-24 NOTE — Progress Notes (Signed)
Occupational Therapy Treatment Patient Details Name: Jeffery Horne MRN: [id number] DOB: 11/19/1926 Today's Date: 04/24/2021    History of present illness As per MD: Jeffery Horne is a 87 y.o. male with medical history significant for BPH, HTN, DM, PVD, resident of an assisted living facility who was sent to the ED for evaluation of generalized weakness and confusion that appeared to have started several hours following a choking episode at the facility.  Over the past several days prior to the choking episode patient, who at baseline can care for himself with little assistance, has been declining and had generalized weakness.   OT comments  Jeffery Horne was seen for OT treatment on this date. Upon arrival to room pt reclined in bed, agreeable to tx and stating need to sit up. Pt requires MAX A for LBD at bed level. MOD A sup<>sit, pt endorses pain/tenderness on mid back during transfer, not quantified. MOD A grooming seated EOB - initially requires CGA + BUE support decreasing to MOD A for sitting balance 2/2 posterior lean. Pt making progress toward goals. Pt continues to benefit from skilled OT services to maximize return to PLOF and minimize risk of future falls, injury, caregiver burden, and readmission. Will continue to follow POC. Discharge recommendation remains appropriate.    Follow Up Recommendations  SNF    Equipment Recommendations  Other (comment) (TBD at next venue of care)    Recommendations for Other Services      Precautions / Restrictions Precautions Precautions: Fall Restrictions Weight Bearing Restrictions: No       Mobility Bed Mobility Overal bed mobility: Needs Assistance Bed Mobility: Rolling;Supine to Sit;Sit to Supine Rolling: Min guard   Supine to sit: Mod assist Sit to supine: Mod assist        Transfers                 General transfer comment: deferred 2/2 fatigue and poor sitting tolerance    Balance Overall balance assessment:  Needs assistance Sitting-balance support: Feet supported;No upper extremity supported Sitting balance-Leahy Scale: Poor   Postural control: Posterior lean                                 ADL either performed or assessed with clinical judgement   ADL Overall ADL's : Needs assistance/impaired                                       General ADL Comments: MOD A grooming seated EOB - initially requires CGA + BUE support decreasing to MOD A for sitting balance 2/2 posterior lean. MAX A for LBD at bed level      Cognition Arousal/Alertness: Awake/alert Behavior During Therapy: WFL for tasks assessed/performed Overall Cognitive Status: Within Functional Limits for tasks assessed                                 General Comments: pleasant and agreeable stating "I need to sit up"        Exercises Exercises: Other exercises Other Exercises Other Exercises: Pt educated re: HEP, falls prevention Other Exercises: LBD, IS< seated grooming, sup<>sit, rolling, sitting balance/tolerance           Pertinent Vitals/ Pain       Pain Assessment: Faces Faces  Pain Scale: Hurts a little bit Pain Location: mid back tender to touch Pain Descriptors / Indicators: Grimacing;Discomfort;Tender Pain Intervention(s): Limited activity within patient's tolerance;Repositioned   Frequency  Min 1X/week        Progress Toward Goals  OT Goals(current goals can now be found in the care plan section)  Progress towards OT goals: Progressing toward goals  Acute Rehab OT Goals Patient Stated Goal: To "get out of here" OT Goal Formulation: With patient Time For Goal Achievement: 04/27/21 Potential to Achieve Goals: Good ADL Goals Pt Will Perform Grooming: with modified independence;with set-up;sitting Pt Will Perform Lower Body Dressing: with supervision;with set-up;sitting/lateral leans Pt Will Transfer to Toilet: with supervision Pt Will Perform Toileting  - Clothing Manipulation and hygiene: with supervision;sitting/lateral leans  Plan Discharge plan remains appropriate;Frequency remains appropriate       AM-PAC OT "6 Clicks" Daily Activity     Outcome Measure   Help from another person eating meals?: A Little Help from another person taking care of personal grooming?: A Lot Help from another person toileting, which includes using toliet, bedpan, or urinal?: A Lot Help from another person bathing (including washing, rinsing, drying)?: A Lot Help from another person to put on and taking off regular upper body clothing?: A Lot Help from another person to put on and taking off regular lower body clothing?: A Lot 6 Click Score: 13    End of Session    OT Visit Diagnosis: Unsteadiness on feet (R26.81);Muscle weakness (generalized) (M62.81);History of falling (Z91.81)   Activity Tolerance Patient tolerated treatment well;Patient limited by fatigue   Patient Left in bed;with call bell/phone within reach;with bed alarm set   Nurse Communication          Time: 4034-7425 OT Time Calculation (min): 14 min  Charges: OT General Charges $OT Visit: 1 Visit OT Treatments $Self Care/Home Management : 8-22 mins  Kathie Dike, M.S. OTR/L  04/24/21, 2:52 PM  ascom [phone]

## 2021-04-24 NOTE — Consult Note (Signed)
WOC Nurse Consult Note: Patient receiving care in [facility]. Son-in-law present at time of my assessment. Reason for Consult: "pressure wound on buttocks" Wound type: significant MASD-IAD to buttocks, sacrum, coccyx--NOT a PI. Pressure Injury POA: Yes/No/NA Measurement: Wound bed: reddened, tender, has fungal overgrowth and satellite lesions Drainage (amount, consistency, odor)  Periwound: intact Dressing procedure/placement/frequency: BID Triple paste: Apply to reddened areas of buttocks, coccyx, sacrum.  Apply at least twice daily, and after each incontinent episode.  Also, BID nystatin powder sprinkled over the triple paste.  Monitor the wound area(s) for worsening of condition such as: Signs/symptoms of infection,  Increase in size,  Development of or worsening of odor, Development of pain, or increased pain at the affected locations.  Notify the medical team if any of these develop.  Thank you for the consult.  Discussed plan of care with the patient and bedside nurse.  WOC nurse will not follow at this time.  Please re-consult the WOC team if needed.  Helmut Muster, RN, MSN, CWOCN, CNS-BC, pager [phone]

## 2021-04-24 NOTE — Progress Notes (Signed)
   Date of Admission:  04/12/2021     ID: Jeffery Horne is a 87 y.o. male  Principal Problem:   Aspiration pneumonia (HCC) Active Problems:   Hypertension associated with diabetes (HCC)   Type II diabetes mellitus with neurological manifestations (HCC)   Sepsis (HCC)   Generalized weakness   Protein-calorie malnutrition, severe    Subjective: Irritable today because he lost line and he does not want to be poked multiple times Cough is much better No fever Appetite better. Daughter at bedside.  Medications:   acidophilus  1 capsule Oral Daily   aspirin EC  81 mg Oral Daily   [START ON 04/25/2021] Chlorhexidine Gluconate Cloth  6 each Topical Q0600   clopidogrel  75 mg Oral Daily   enoxaparin (LOVENOX) injection  40 mg Subcutaneous Q24H   gabapentin  600 mg Oral BID   linezolid  600 mg Oral Q12H   liver oil-zinc oxide   Topical BID   metoprolol succinate  25 mg Oral Daily   mupirocin ointment  1 application Nasal BID   nystatin   Topical BID   tamsulosin  0.4 mg Oral Daily   Pt irritable as he lost peripheral lines and they gave him multiple sticks Objective: Vital signs in last 24 hours: Temp:  [97.6 F (36.4 C)-98.5 F (36.9 C)] 98.1 F (36.7 C) (08/15 0724) Pulse Rate:  [62-68] 62 (08/15 0724) Resp:  [16-18] 16 (08/15 0724) BP: (114-164)/(55-64) 164/59 (08/15 0724) SpO2:  [90 %-95 %] 90 % (08/15 0724)  PHYSICAL EXAM:  General: Alert, cooperative, no distress, emaciated Head: Normocephalic, without obvious abnormality, atraumatic. Eyes: Conjunctivae clear, anicteric sclerae. Pupils are equal ENT Nares normal. No drainage or sinus tenderness. Lips, mucosa, and tongue normal. No Thrush Lungs:b/l air entry- crepts rt side Heart: Regular rate and rhythm, no murmur, rub or gallop. Abdomen: Soft, non-tender,not distended. Bowel sounds normal. No masses Extremities: atraumatic, no cyanosis. No edema. No clubbing Skin: No rashes or lesions. Or bruising Lymph:  Cervical, supraclavicular normal. Neurologic: Grossly non-focal  Lab Results Recent Labs    04/22/21 0548 04/22/21 2340 04/24/21 0704  WBC 13.1*  --  13.8*  HGB 10.0*  --  9.9*  HCT 31.7*  --  31.2*  NA 137  --  137  K 3.8  --  4.0  CL 100  --  101  CO2 31  --  29  BUN 14  --  14  CREATININE 0.59* 0.68 0.62   Assessment/Plan: Aspiration with necrotizing pneumonia of the right lung.  Superadded MRSA infection.  Afebrile for the past few days.  On linezolid and Unasyn.  Change the Unasyn to oral antibiotic as patient has lost a  peripheral line.  On discharge patient is going to need doxycycline and Augmentin for 3 more weeks.  End date of antibiotics would be 05/16/2021.   Protein calorie malnutrition.  Seen by nutritionist  BPH on tamsulosin For hypertension on metoprolol  Discussed the management with the patient, daughter at bedside and the hospitalist.

## 2021-04-25 DIAGNOSIS — J690 Pneumonitis due to inhalation of food and vomit: Secondary | ICD-10-CM | POA: Diagnosis not present

## 2021-04-25 DIAGNOSIS — J9601 Acute respiratory failure with hypoxia: Secondary | ICD-10-CM

## 2021-04-25 DIAGNOSIS — R531 Weakness: Secondary | ICD-10-CM | POA: Diagnosis not present

## 2021-04-25 DIAGNOSIS — E43 Unspecified severe protein-calorie malnutrition: Secondary | ICD-10-CM

## 2021-04-25 DIAGNOSIS — E1159 Type 2 diabetes mellitus with other circulatory complications: Secondary | ICD-10-CM | POA: Diagnosis not present

## 2021-04-25 LAB — SARS CORONAVIRUS 2 (TAT 6-24 HRS): SARS Coronavirus 2: NEGATIVE

## 2021-04-25 MED ORDER — AMOXICILLIN-POT CLAVULANATE 875-125 MG PO TABS: 1.0000 | ORAL_TABLET | Freq: Two times a day (BID) | ORAL | 0 refills | Status: AC

## 2021-04-25 MED ORDER — MUPIROCIN 2 % EX OINT: 1.0000 "application " | TOPICAL_OINTMENT | Freq: Two times a day (BID) | CUTANEOUS | 0 refills | Status: AC

## 2021-04-25 MED ORDER — DOXYCYCLINE HYCLATE 100 MG PO TABS: 100.0000 mg | ORAL_TABLET | Freq: Two times a day (BID) | ORAL | Status: DC

## 2021-04-25 MED ORDER — DOXYCYCLINE HYCLATE 100 MG PO TABS: 100.0000 mg | ORAL_TABLET | Freq: Two times a day (BID) | ORAL | 0 refills | Status: AC

## 2021-04-25 MED ORDER — RISAQUAD PO CAPS: 1.0000 | ORAL_CAPSULE | Freq: Every day | ORAL | Status: AC

## 2021-04-25 MED ORDER — NYSTATIN 100000 UNIT/GM EX POWD: Freq: Two times a day (BID) | CUTANEOUS | 0 refills | Status: AC

## 2021-04-25 MED ORDER — LOPERAMIDE HCL 2 MG PO CAPS: 2.0000 mg | ORAL_CAPSULE | ORAL | 0 refills | Status: AC | PRN

## 2021-04-25 MED ORDER — ZINC OXIDE 40 % EX OINT: TOPICAL_OINTMENT | Freq: Two times a day (BID) | CUTANEOUS | 0 refills | Status: AC

## 2021-04-25 NOTE — Discharge Summary (Signed)
Physician Discharge Summary  JOHNATHEN TESTA EAV:[id number] DOB: October 26, 1926 DOA: 04/12/2021  PCP: Marjie Skiff, NP  Admit date: 04/12/2021 Discharge date: 04/25/2021  Admitted From: Assisted living facility Disposition: SNF  Recommendations for Outpatient Follow-up:  Follow up with PCP in 1-2 weeks Follow-up with infectious disease.  In 1 to 2 weeks  Follow-up with pulmonology Please obtain BMP/CBC in one week Please follow up on the following pending results: None  Home Health: No Equipment/Devices: Rolling walker Discharge Condition: Stable CODE STATUS: DNR Diet recommendation: Heart Healthy / Carb Modified   Brief/Interim Summary: Mr. Whobrey is a 87 y.o. M with DM, vascular disease, and HTN who presented with few days weakness and progressive decrease in functional level, then a severe choking episode, confusion and hypoxia and cough.   At baseline the patient is able to transfer, do self cares, but over the last several days he has gotten weaker and weaker until he is unable to get up without assistance.  Then on the day of admission he developed a fever, had a prolonged severe choking episode, and appeared confused so 911 was called.  In the ER, CXR showed pneumonia.  Sepsis ruled out as there was no significant systemic response or organ failure noted.  Initially treated with Rocephin and a Zithromax. Continued to have fever, repeat CT chest on 04/17/2021 with some concern of necrotizing pneumonia and worsening of emphysema.  MRSA swab positive.  Infectious diseases also involved and antibiotics were initially switched to Zosyn and linezolid followed by Unasyn and linezolid. He was discharged on Augmentin and doxycycline as taking linezolid for longer duration was not beneficial due to risk factors and side effects.  Per infectious disease he will continue his antibiotics till 05/16/2021 and can follow-up with them as an outpatient for further recommendations.  Sputum cultures grew  MRSA.  We also discussed him with the radiology, interventional radiology and thoracic surgery and based on his comorbidities, advanced age and frail condition it was not fruitful to do any procedure for his possible lung abscess/necrotizing pneumonia.  He was not a candidate for any surgical procedure.  He will continue antibiotics for longer duration which was listed above.  He remained afebrile for about 3 days before discharge.  Very frail and malnourished gentleman and high risk for deterioration and death.  Palliative care was also consulted and he was made DNR after discussing goals of care.  Palliative care will follow up as an outpatient.  Please arrange to services while he was in rehab.  Patient developed some diarrhea.  Having 2-3 loose bowel movements daily.  No watery stools.  No abdominal pain.  No worsening of leukocytosis so he does not qualify for retesting of C. Difficile. It was thought to be due to antibiotic use.  Patient was given some probiotic as he does not want to eat any yogurt.  Apparently using Ensure and boost also causes diarrhea in him.  May be some lactose intolerance.  Patient will be high risk for C. difficile based on his prolonged antibiotic use. Please continue with chest physical therapy as aggressive pulmonary toileting will help with his resolution of symptoms.  Patient did developed hypokalemia and hypomagnesemia during the course of hospitalization which were repleted as needed.  Has an history of cardiovascular disease and will continue with home dose of aspirin, Plavix and metoprolol.  He will continue with gabapentin for his polyneuropathy.  His blood glucose level remained within goal while he was in the hospital.  Patient  was also found to have severe protein caloric malnutrition with severe muscle wasting.  Dietitian was consulted and he will need supplements with his diet.  Because of prolonged hospitalization and physical deconditioning, PT  evaluated him and recommending going to rehab.  He will not be safe at assisted living facility at this point with the hope that he will go back to his original living environment after getting some rehab.  He will continue with rest of his home medications and follow-up with his providers.  Discharge Diagnoses:  Principal Problem:   Aspiration pneumonia (HCC) Active Problems:   Hypertension associated with diabetes (HCC)   Type II diabetes mellitus with neurological manifestations (HCC)   Sepsis (HCC)   Generalized weakness   Protein-calorie malnutrition, severe   Discharge Instructions  Discharge Instructions     Diet - low sodium heart healthy   Complete by: As directed    Discharge instructions   Complete by: As directed    From Dr. Maryfrances Bunnell: You were admitted with pneumonia You were treated here with antibiotics You should complete the course of antibiotics at home: Take cefpodoxime 200 mg twice daily starting tonight for 4 more days (through Wednesday) Take azithromycin tonight and tomorrow night   Also, use the incentive spirometer until you stop coughing up phlegm, for the next week or so. Do this at least every hour When you use them, use the clear incentive spirometer for one deep breath Then use the green flutter valve until you cough  Go see your primary care doctor in 1 week   Discharge wound care:   Complete by: As directed    Keep the area dry, frequently change position. Can apply gel dressing. Can do Desitin, triple paste as needed   Increase activity slowly   Complete by: As directed    Increase activity slowly   Complete by: As directed       Allergies as of 04/25/2021   No Known Allergies      Medication List     TAKE these medications    acetaminophen 325 MG tablet Commonly known as: TYLENOL Take 2 tablets (650 mg total) by mouth every 6 (six) hours as needed for mild pain (or Fever >/= 101).   acidophilus Caps capsule Take 1 capsule  by mouth daily. Start taking on: April 26, 2021   amoxicillin-clavulanate 875-125 MG tablet Commonly known as: AUGMENTIN Take 1 tablet by mouth every 12 (twelve) hours for 21 days.   aspirin EC 81 MG tablet Take 81 mg by mouth daily.   clopidogrel 75 MG tablet Commonly known as: PLAVIX Take 75 mg by mouth daily.   clotrimazole-betamethasone cream Commonly known as: LOTRISONE Apply 1 application topically 2 (two) times daily.   doxycycline 100 MG tablet Commonly known as: VIBRA-TABS Take 1 tablet (100 mg total) by mouth 2 (two) times daily for 21 days.   Eucerin Intensive Repair Lotn Apply 1 application topically at bedtime.   fexofenadine 180 MG tablet Commonly known as: ALLEGRA Take 180 mg by mouth daily.   gabapentin 300 MG capsule Commonly known as: NEURONTIN Take 2 capsules (600 mg total) by mouth 2 (two) times daily.   glucose blood test strip For testing blood sugar twice per day.   hydrocortisone 1 % ointment Apply 1 application topically at bedtime.   liver oil-zinc oxide 40 % ointment Commonly known as: DESITIN Apply topically 2 (two) times daily.   loperamide 2 MG capsule Commonly known as: IMODIUM Take 1 capsule (2  mg total) by mouth as needed for diarrhea or loose stools.   metoprolol succinate 25 MG 24 hr tablet Commonly known as: TOPROL-XL Take 25 mg by mouth daily.   mirtazapine 7.5 MG tablet Commonly known as: REMERON Take 1 tablet (7.5 mg total) by mouth at bedtime.   multivitamin with minerals Tabs tablet Take 1 tablet by mouth daily.   mupirocin ointment 2 % Commonly known as: BACTROBAN Place 1 application into the nose 2 (two) times daily.   nystatin powder Commonly known as: MYCOSTATIN/NYSTOP Apply topically 2 (two) times daily.   tamsulosin 0.4 MG Caps capsule Commonly known as: FLOMAX TAKE 1 CAPSULE BY MOUTH EVERY DAY   vitamin B-12 1000 MCG tablet Commonly known as: CYANOCOBALAMIN Take 1,000 mcg by mouth daily.                Discharge Care Instructions  (From admission, onward)           Start     Ordered   04/25/21 0000  Discharge wound care:       Comments: Keep the area dry, frequently change position. Can apply gel dressing. Can do Desitin, triple paste as needed   04/25/21 1144            Contact information for follow-up providers     Cannady, Dorie RankJolene T, NP. Schedule an appointment as soon as possible for a visit in 1 week(s).   Specialty: Nurse Practitioner Contact information: [address] [place] [id number] [phone]              Contact information for after-discharge care     Destination     [facility] .   Service: Skilled Nursing Contact information: [address] [facility] [place] [id number] [phone]                    No Known Allergies  Consultations: Infectious disease Palliative care  Procedures/Studies: DG Chest 2 View  Result Date: 04/17/2021 CLINICAL DATA:  87 year old male with history of fever. EXAM: CHEST - 2 VIEW COMPARISON:  Chest x-ray 04/12/2021. FINDINGS: Patchy multifocal airspace consolidation and interstitial prominence noted throughout the right lung. Small right pleural effusion which may be partially loculated in the lower lateral right hemithorax. Left lung appears clear. No left pleural effusion. Emphysematous changes are noted throughout the lungs. No pneumothorax. No evidence of pulmonary edema. Heart size is normal. Upper mediastinal contours are within normal limits. Atherosclerosis in the thoracic aorta. Left-sided pacemaker device in place with lead tips projecting over the expected location of the right atrium and right ventricle. IMPRESSION: 1. Worsening multilobar right-sided pneumonia with small right-sided parapneumonic pleural effusion which may be partially loculated laterally. 2. Emphysema. 3. Aortic atherosclerosis. Electronically Signed   By: Trudie Reedaniel  Entrikin M.D.    On: 04/17/2021 08:01   DG Chest 2 View  Result Date: 04/12/2021 CLINICAL DATA:  questionable aspiration EXAM: CHEST - 2 VIEW COMPARISON:  None. FINDINGS: Patient positioning/rotation limits evaluation. New patchy interstitial airspace opacities in the right lung. Left lung is clear. No visible pleural effusions or pneumothorax. Cardiomediastinal silhouette is likely within normal limits when accounting for patient rotation. Left subclavian approach dual lead cardiac rhythm maintenance device. Exaggerated thoracic kyphosis with multilevel thoracic degenerative change and osteopenia. IMPRESSION: New patchy interstitial airspace opacities in the right lung, concerning for aspiration and/or pneumonia. Electronically Signed   By: Feliberto HartsFrederick S Jones MD   On: 04/12/2021 17:04   CT HEAD  WO CONTRAST ( )  Result Date: 04/12/2021 CLINICAL DATA:  Mental status change, unknown cause. Additional history provided: Patient's daughter repeats patient experiencing progressive weakness for the past 2 weeks, patient having "difficulty getting words out." EXAM: CT HEAD WITHOUT CONTRAST TECHNIQUE: Contiguous axial images were obtained from the base of the skull through the vertex without intravenous contrast. COMPARISON:  Head CT 01/11/2011. FINDINGS: Brain: Mild-to-moderate generalized cerebral atrophy. Comparatively mild cerebellar atrophy. Mild patchy and ill-defined hypoattenuation within the cerebral white matter, nonspecific but compatible with chronic small vessel ischemic disease. Chronic appearing lacunar infarct within the left caudate nucleus. There is no acute intracranial hemorrhage. No demarcated cortical infarct. No extra-axial fluid collection. No evidence of an intracranial mass. No midline shift. Vascular: No hyperdense vessel.  Atherosclerotic calcifications. Skull: Normal. Negative for fracture or focal lesion. Sinuses/Orbits: Visualized orbits show no acute finding. Postsurgical appearance of the paranasal  sinuses. Mild mucosal thickening within the bilateral ethmoid air cells/ethmoidectomy cavities. Small right maxillary sinus fluid level. IMPRESSION: No evidence of acute intracranial abnormality. Mild chronic small vessel ischemic changes within the cerebral white matter. Chronic appearing left basal ganglia lacunar infarct. Mild-to-moderate generalized cerebral atrophy. Comparatively mild cerebellar atrophy. Paranasal sinus disease at the imaged levels, as described. Electronically Signed   By: Jackey Loge DO   On: 04/12/2021 16:21   CT CHEST WO CONTRAST  Result Date: 04/17/2021 CLINICAL DATA:  Fever, abnormal chest x-ray. EXAM: CT CHEST WITHOUT CONTRAST TECHNIQUE: Multidetector CT imaging of the chest was performed following the standard protocol without IV contrast. COMPARISON:  Radiograph same day. FINDINGS: Cardiovascular: Atherosclerosis of thoracic aorta is noted without aneurysm formation. Left-sided pacemaker is in grossly good position. Normal cardiac size. No pericardial effusion. Mediastinum/Nodes: No enlarged mediastinal or axillary lymph nodes. Thyroid gland, trachea, and esophagus demonstrate no significant findings. Lungs/Pleura: No pneumothorax is noted. Small right pleural effusion is noted. Airspace opacity is noted posteriorly in the right upper and lower lobes concerning for pneumonia. Some cystic formation is seen in the inflamed portion of lung which may represent some degree of necrosis or possibly emphysema. Minimal left posterior basilar subsegmental atelectasis is noted. Upper Abdomen: Cholelithiasis. Musculoskeletal: No chest wall mass or suspicious bone lesions identified. IMPRESSION: Large airspace opacity is noted posteriorly in the right upper and lower lobes concerning for pneumonia. Multiple cysts are seen in the inflamed portion of the right lung which may represent necrosis or possibly underlying emphysema. Small right pleural effusion is noted. Minimal left posterior basilar  subsegmental atelectasis is noted. Cholelithiasis. Aortic Atherosclerosis (ICD10-I70.0) and Emphysema (ICD10-J43.9). Electronically Signed   By: Lupita Raider M.D.   On: 04/17/2021 14:34    Subjective: Patient was seen and examined today.  Having 2-3 loose bowel movements, no watery diarrhea.  No abdominal pain, no nausea or vomiting.  Does not want to eat yogurt so probiotic were prescribed. It was thought to be due to antibiotic use.  Started eating with some improvement in appetite.  Daughter at bedside.  Discharge Exam: Vitals:   04/25/21 0544 04/25/21 0856  BP: (!) 162/56 (!) 159/58  Pulse: 70 78  Resp: 16 20  Temp: 98.6 F (37 C) 98.2 F (36.8 C)  SpO2: 92% 94%   Vitals:   04/24/21 1533 04/24/21 2000 04/25/21 0544 04/25/21 0856  BP: (!) 143/57 (!) 117/52 (!) 162/56 (!) 159/58  Pulse: 68 64 70 78  Resp: 18 16 16 20   Temp: 98.3 F (36.8 C) 98.8 F (37.1 C) 98.6 F (37 C) 98.2 F (  36.8 C)  TempSrc:      SpO2: 92% 91% 92% 94%  Weight:      Height:        General: Pt is alert, awake, not in acute distress Cardiovascular: RRR, S1/S2 +, no rubs, no gallops Respiratory: CTA bilaterally, no wheezing, no rhonchi Abdominal: Soft, NT, ND, bowel sounds + Extremities: no edema, no cyanosis   The results of significant diagnostics from this hospitalization (including imaging, microbiology, ancillary and laboratory) are listed below for reference.    Microbiology: Recent Results (from the past 240 hour(s))  Expectorated Sputum Assessment w Gram Stain, Rflx to Resp Cult     Status: None   Collection Time: 04/17/21  1:18 PM   Specimen: Sputum  Result Value Ref Range Status   Specimen Description SPUTUM  Final   Special Requests NONE  Final   Sputum evaluation   Final    Sputum specimen not acceptable for testing.  Please recollect.   Performed at [facility], [address] [place], [place] [address]    Report Status 04/17/2021 FINAL  Final  Expectorated  Sputum Assessment w Gram Stain, Rflx to Resp Cult     Status: None   Collection Time: 04/17/21  3:15 PM   Specimen: Sputum  Result Value Ref Range Status   Specimen Description SPU  Final   Special Requests NONE  Final   Sputum evaluation   Final    THIS SPECIMEN IS ACCEPTABLE FOR SPUTUM CULTURE Performed at [facility], [address], [place], [place] [address]    Report Status 04/17/2021 FINAL  Final  Culture, Respiratory w Gram Stain     Status: None   Collection Time: 04/17/21  3:15 PM   Specimen: Sputum  Result Value Ref Range Status   Specimen Description   Final    SPU Performed at [facility], [address], [place], [place] [address]    Special Requests   Final    NONE Reflexed from [id number] Performed at [facility], [address] [place], [place] [address]    Gram Stain   Final    ABUNDANT SQUAMOUS EPITHELIAL CELLS PRESENT RARE WBC PRESENT, PREDOMINANTLY PMN RARE YEAST Performed at [facility], [address], [place], [place] [address]    Culture   Final    RARE CANDIDA ALBICANS RARE METHICILLIN RESISTANT STAPHYLOCOCCUS AUREUS    Report Status 04/21/2021 FINAL  Final   Organism ID, Bacteria METHICILLIN RESISTANT STAPHYLOCOCCUS AUREUS  Final      Susceptibility   Methicillin resistant staphylococcus aureus - MIC*    CIPROFLOXACIN >=8 RESISTANT Resistant     ERYTHROMYCIN >=8 RESISTANT Resistant     GENTAMICIN <=0.5 SENSITIVE Sensitive     OXACILLIN >=4 RESISTANT Resistant     TETRACYCLINE <=1 SENSITIVE Sensitive     VANCOMYCIN 1 SENSITIVE Sensitive     TRIMETH/SULFA >=320 RESISTANT Resistant     CLINDAMYCIN <=0.25 SENSITIVE Sensitive     RIFAMPIN <=0.5 SENSITIVE Sensitive     Inducible Clindamycin NEGATIVE Sensitive     * RARE METHICILLIN RESISTANT STAPHYLOCOCCUS AUREUS  MRSA Next Gen by PCR, Nasal     Status: Abnormal   Collection Time: 04/17/21  5:14 PM   Specimen: Nasal Mucosa; Nasal Swab  Result Value Ref Range  Status   MRSA by PCR Next Gen DETECTED (A) NOT DETECTED Final    Comment: RESULT CALLED TO, READ BACK BY AND VERIFIED WITH: KRISTYN DAVIS ON 04/17/21   SKL (  NOTE) The GeneXpert MRSA Assay (FDA approved for NASAL specimens only), is one component of a comprehensive MRSA colonization surveillance program. It is not intended to diagnose MRSA infection nor to guide or monitor treatment for MRSA infections. Test performance is not FDA approved in patients less than 16 years old. Performed at [facility], [address] [place], [place] [address]   SARS CORONAVIRUS 2 (TAT 6-24 HRS) Nasopharyngeal Nasopharyngeal Swab     Status: None   Collection Time: 04/24/21  6:30 PM   Specimen: Nasopharyngeal Swab  Result Value Ref Range Status   SARS Coronavirus 2 NEGATIVE NEGATIVE Final    Comment: (NOTE) SARS-CoV-2 target nucleic acids are NOT DETECTED.  The SARS-CoV-2 RNA is generally detectable in upper and lower respiratory specimens during the acute phase of infection. Negative results do not preclude SARS-CoV-2 infection, do not rule out co-infections with other pathogens, and should not be used as the sole basis for treatment or other patient management decisions. Negative results must be combined with clinical observations, patient history, and epidemiological information. The expected result is Negative.  Fact Sheet for Patients: HairSlick.no  Fact Sheet for Healthcare Providers: quierodirigir.com  This test is not yet approved or cleared by the [place] FDA and  has been authorized for detection and/or diagnosis of SARS-CoV-2 by FDA under an Emergency Use Authorization (EUA). This EUA will remain  in effect (meaning this test can be used) for the duration of the COVID-19 declaration under Se ction 564(b)(1) of the Act, 21 U.S.C. section 360bbb-3(b)(1), unless the authorization is terminated or revoked  sooner.  Performed at [facility], [address] [place], [place] [address]      Labs: BNP (last 3 results) No results for input(s): BNP in the last 8760 hours. Basic Metabolic Panel: Recent Labs  Lab 04/19/21 0609 04/20/21 0605 04/22/21 0548 04/22/21 2340 04/24/21 0704  NA 138 139 137  --  137  K 3.3* 3.7 3.8  --  4.0  CL 98 102 100  --  101  CO2 31 34* 31  --  29  GLUCOSE 130* 114* 115*  --  110*  BUN --  14  CREATININE 0.55* 0.63 0.59* 0.68 0.62  CALCIUM 8.1* 7.7* 8.0*  --  8.0*  MG 1.6* 2.0  --   --   --    Liver Function Tests: No results for input(s): AST, ALT, ALKPHOS, BILITOT, PROT, ALBUMIN in the last 168 hours. No results for input(s): LIPASE, AMYLASE in the last 168 hours. No results for input(s): AMMONIA in the last 168 hours. CBC: Recent Labs  Lab 04/19/21 0609 04/22/21 0548 04/24/21 0704  WBC 12.2* 13.1* 13.8*  HGB 10.2* 10.0* 9.9*  HCT 32.2* 31.7* 31.2*  MCV 91.2 92.4 92.0  PLT 314 427* 439*   Cardiac Enzymes: No results for input(s): CKTOTAL, CKMB, CKMBINDEX, TROPONINI in the last 168 hours. BNP: Invalid input(s): POCBNP CBG: No results for input(s): GLUCAP in the last 168 hours. D-Dimer No results for input(s): DDIMER in the last 72 hours. Hgb A1c No results for input(s): HGBA1C in the last 72 hours. Lipid Profile No results for input(s): CHOL, HDL, LDLCALC, TRIG, CHOLHDL, LDLDIRECT in the last 72 hours. Thyroid function studies No results for input(s): TSH, T4TOTAL, T3FREE, THYROIDAB in the last 72 hours.  Invalid input(s): FREET3 Anemia work up No results for input(s): VITAMINB12, FOLATE, FERRITIN, TIBC, IRON, RETICCTPCT in the last 72 hours. Urinalysis    Component Value  Date/Time   COLORURINE AMBER (A) 04/13/2021 0058   APPEARANCEUR HAZY (A) 04/13/2021 0058   APPEARANCEUR Clear 04/12/2021 1149   LABSPEC 1.026 04/13/2021 0058   PHURINE 5.0 04/13/2021 0058   GLUCOSEU NEGATIVE 04/13/2021 0058   HGBUR NEGATIVE  04/13/2021 0058   BILIRUBINUR NEGATIVE 04/13/2021 0058   BILIRUBINUR Negative 04/12/2021 1149   KETONESUR NEGATIVE 04/13/2021 0058   PROTEINUR 30 (A) 04/13/2021 0058   NITRITE NEGATIVE 04/13/2021 0058   LEUKOCYTESUR NEGATIVE 04/13/2021 0058   Sepsis Labs Invalid input(s): PROCALCITONIN,  WBC,  LACTICIDVEN Microbiology Recent Results (from the past 240 hour(s))  Expectorated Sputum Assessment w Gram Stain, Rflx to Resp Cult     Status: None   Collection Time: 04/17/21  1:18 PM   Specimen: Sputum  Result Value Ref Range Status   Specimen Description SPUTUM  Final   Special Requests NONE  Final   Sputum evaluation   Final    Sputum specimen not acceptable for testing.  Please recollect.   Performed at [facility], [address] [place], [place] [address]    Report Status 04/17/2021 FINAL  Final  Expectorated Sputum Assessment w Gram Stain, Rflx to Resp Cult     Status: None   Collection Time: 04/17/21  3:15 PM   Specimen: Sputum  Result Value Ref Range Status   Specimen Description SPU  Final   Special Requests NONE  Final   Sputum evaluation   Final    THIS SPECIMEN IS ACCEPTABLE FOR SPUTUM CULTURE Performed at [facility], [address] [place], [place] [address]    Report Status 04/17/2021 FINAL  Final  Culture, Respiratory w Gram Stain     Status: None   Collection Time: 04/17/21  3:15 PM   Specimen: Sputum  Result Value Ref Range Status   Specimen Description   Final    SPU Performed at [facility], [address], [place], [place] [address]    Special Requests   Final    NONE Reflexed from [id number] Performed at [facility], [address] [place], [place] [address]    Gram Stain   Final    ABUNDANT SQUAMOUS EPITHELIAL CELLS PRESENT RARE WBC PRESENT, PREDOMINANTLY PMN RARE YEAST Performed at [facility], [address], [place], [place] [address]    Culture   Final    RARE CANDIDA ALBICANS RARE METHICILLIN  RESISTANT STAPHYLOCOCCUS AUREUS    Report Status 04/21/2021 FINAL  Final   Organism ID, Bacteria METHICILLIN RESISTANT STAPHYLOCOCCUS AUREUS  Final      Susceptibility   Methicillin resistant staphylococcus aureus - MIC*    CIPROFLOXACIN >=8 RESISTANT Resistant     ERYTHROMYCIN >=8 RESISTANT Resistant     GENTAMICIN <=0.5 SENSITIVE Sensitive     OXACILLIN >=4 RESISTANT Resistant     TETRACYCLINE <=1 SENSITIVE Sensitive     VANCOMYCIN 1 SENSITIVE Sensitive     TRIMETH/SULFA >=320 RESISTANT Resistant     CLINDAMYCIN <=0.25 SENSITIVE Sensitive     RIFAMPIN <=0.5 SENSITIVE Sensitive     Inducible Clindamycin NEGATIVE Sensitive     * RARE METHICILLIN RESISTANT STAPHYLOCOCCUS AUREUS  MRSA Next Gen by PCR, Nasal     Status: Abnormal   Collection Time: 04/17/21  5:14 PM   Specimen: Nasal Mucosa; Nasal Swab  Result Value Ref Range Status   MRSA by PCR Next Gen DETECTED (A) NOT DETECTED Final    Comment: RESULT CALLED TO, READ BACK BY AND VERIFIED WITH: KRISTYN DAVIS ON  04/17/21   SKL (NOTE) The GeneXpert MRSA Assay (FDA approved for NASAL specimens only), is one component of a comprehensive MRSA colonization surveillance program. It is not intended to diagnose MRSA infection nor to guide or monitor treatment for MRSA infections. Test performance is not FDA approved in patients less than 68 years old. Performed at [facility], [address] [place], [place] [address]   SARS CORONAVIRUS 2 (TAT 6-24 HRS) Nasopharyngeal Nasopharyngeal Swab     Status: None   Collection Time: 04/24/21  6:30 PM   Specimen: Nasopharyngeal Swab  Result Value Ref Range Status   SARS Coronavirus 2 NEGATIVE NEGATIVE Final    Comment: (NOTE) SARS-CoV-2 target nucleic acids are NOT DETECTED.  The SARS-CoV-2 RNA is generally detectable in upper and lower respiratory specimens during the acute phase of infection. Negative results do not preclude SARS-CoV-2 infection, do not rule out co-infections  with other pathogens, and should not be used as the sole basis for treatment or other patient management decisions. Negative results must be combined with clinical observations, patient history, and epidemiological information. The expected result is Negative.  Fact Sheet for Patients: HairSlick.no  Fact Sheet for Healthcare Providers: quierodirigir.com  This test is not yet approved or cleared by the [place] FDA and  has been authorized for detection and/or diagnosis of SARS-CoV-2 by FDA under an Emergency Use Authorization (EUA). This EUA will remain  in effect (meaning this test can be used) for the duration of the COVID-19 declaration under Se ction 564(b)(1) of the Act, 21 U.S.C. section 360bbb-3(b)(1), unless the authorization is terminated or revoked sooner.  Performed at [facility], [address], [place], [place] [address]     Time coordinating discharge: Over 30 minutes  SIGNED:  Arnetha Courser, MD  [facility] 04/25/2021, 11:45 AM  If 7PM-7AM, please contact night-coverage [email or web address]  This record has been created using Conservation officer, historic buildings. Errors have been sought and corrected,but may not always be located. Such creation errors do not reflect on the standard of care.

## 2021-04-25 NOTE — TOC Progression Note (Signed)
Transition of Care [facility]) - Progression Note    Patient Details  Name: Jeffery Horne MRN: [id number] Date of Birth: 1927-06-08  [facility]) CM/SW Contact  Allayne Butcher, RN Phone Number: 04/25/2021, 8:44 AM  Clinical Narrative:    Patient is medically cleared for discharge to [facility] today.  Insurance authorization started.     Expected Discharge Plan: Skilled Nursing Facility Barriers to Discharge: Continued Medical Work up  Expected Discharge Plan and Services Expected Discharge Plan: Skilled Nursing Facility   Discharge Planning Services: CM Consult   Living arrangements for the past 2 months: Assisted Living Facility Expected Discharge Date: 04/15/21               DME Arranged: N/A DME Agency: NA       HH Arranged: NA HH Agency: NA         Social Determinants of Health (SDOH) Interventions    Readmission Risk Interventions No flowsheet data found.

## 2021-04-25 NOTE — TOC Transition Note (Signed)
Transition of Care [facility]) - CM/SW Discharge Note   Patient Details  Name: Jeffery Horne MRN: [id number] Date of Birth: [birth year]  Transition of Care [facility]) CM/SW Contact:  Allayne Butcher, RN Phone Number: 04/25/2021, 12:17 PM   Clinical Narrative:    Patient medically cleared for discharge to [facility] for short term rehab today.  [employer] has approved authorization.  Bedside RN will call report.  RNCM has scheduled EMS transport, patient is 6th on the list for pick up.  Family updated on discharge today.   Final next level of care: Skilled Nursing Facility Barriers to Discharge: Barriers Resolved   Patient Goals and CMS Choice Patient states their goals for this hospitalization and ongoing recovery are:: Patient wants to get better go to rehab and then be able to return to [facility] CMS Medicare.gov Compare Post Acute Care list provided to:: Patient Choice offered to / list presented to : Patient, Adult Children  Discharge Placement              Patient chooses bed at: [facility] Patient to be transferred to facility by: [facility] Name of family member notified: daughter [name] Patient and family notified of of transfer: 04/25/21  Discharge Plan and Services   Discharge Planning Services: CM Consult            DME Arranged: N/A DME Agency: NA       HH Arranged: NA HH Agency: NA        Social Determinants of Health (SDOH) Interventions     Readmission Risk Interventions No flowsheet data found.

## 2021-04-25 NOTE — Care Management Important Message (Signed)
Important Message  Patient Details  Name: Jeffery Horne MRN: [id number] Date of Birth: 05/25/1927   Medicare Important Message Given:  Yes  Talked with Liz Malady, daughter by phone [phone]) and reviewed the Important Message from Medicare with her. She is in agreement with the discharge plan for today. I asked if she would like a copy of the form and she replied yes.  I sent a copy via secure e-mail to [email or web address] I thanked her for her time.  Olegario Messier A Yazan Gatling 04/25/2021, 12:13 PM

## 2021-04-27 ENCOUNTER — Non-Acute Institutional Stay: Payer: Medicare Other | Admitting: Primary Care

## 2021-04-27 ENCOUNTER — Other Ambulatory Visit: Payer: Self-pay

## 2021-04-27 DIAGNOSIS — Z515 Encounter for palliative care: Secondary | ICD-10-CM

## 2021-04-27 DIAGNOSIS — R531 Weakness: Secondary | ICD-10-CM

## 2021-04-27 DIAGNOSIS — J439 Emphysema, unspecified: Secondary | ICD-10-CM

## 2021-04-27 DIAGNOSIS — E43 Unspecified severe protein-calorie malnutrition: Secondary | ICD-10-CM

## 2021-04-27 NOTE — Progress Notes (Signed)
[facility] Pall[facility] Care Consult Note Telephone: [phone]  Fax: [phone]   Date of encounter: 04/27/21 5:37 PM PATIENT NAME: Jeffery Horne [place] [id number]   [phone] (home)  DOB: [birth year] MRN: [id number] PRIMARY CARE PROVIDER:    Venita Lick, NP,  [place] [address] [phone]  REFERRING PROVIDER:   Alvester Morin, MD [address] [place],  [place] [address] [phone]  RESPONSIBLE PARTY:    Contact Information     Name Relation Home Work Mobile   TONEY,CAROLYN Daughter [phone]  [phone]   Wilbert, Hayashi Daughter [phone]  [phone]        I met face to face with patient and family in [facility] facility. Palliative Care was asked to follow this patient by consultation request of  Slade-Hartman, Ivette Loyal* to address advance care planning and complex medical decision making. This is the initial visit.                                     ASSESSMENT AND PLAN / RECOMMENDATIONS:   Advance Care Planning/Goals of Care: Goals include to maximize quality of life and symptom management. Patient/health care surrogate gave his/her permission to discuss.Our advance care planning conversation included a discussion about:    The value and importance of advance care planning  Experiences with loved ones who have been seriously ill or have died  Exploration of personal, cultural or spiritual beliefs that might influence medical decisions  Exploration of goals of care in the event of a sudden injury or illness  Identification  of a healthcare agent - both daughters Review and updating of an  advance directive document . Decision not to resuscitate s due to poor prognosis. CODE STATUS: DNR, MOST on file with current directives  Symptom Management/Plan:  Nutrition: Has decreased po intake and albumin low at 3.3. Wt 130. BMI 21.6.Family endorses anorexia,  states recently put on thickened liquids. They are using supplements eg boost breeze as milk based gives him diarrhea.  Bowels: Some loose stools, is able to toilet if taken and put on toilet. Observed for increased fluid loss due to diarrhea. This appears  stable now.  Debility: Family outlined him being very ill and in hospital recently. He has been ill before and gotten back to a functional baseline in the past. They do see him more debilitated this time, after lengthy hospital stay and infections. We discussed his QoL. They would not want to prolong him, as this was his distinct directive. However, they would like him to have opportunity to make recovery if that is what he wishes.  They endorse significant hearing loss which may impact his recovery. They are going to obtain an amplifier. He also has hearing aids but they are out of date.   Delirium: Family states there has been some. We discussed the hearing loss impacting that as well.   Follow up Palliative Care Visit: Palliative care will continue to follow for complex medical decision making, advance care planning, and clarification of goals. Return 1-2 weeks or prn.  I spent 35 minutes providing this consultation. More than 50% of the time in this consultation was spent in counseling and care coordination.  PPS: 30%  HOSPICE ELIGIBILITY/DIAGNOSIS: TBD  Chief Complaint: debility  HISTORY OF PRESENT ILLNESS:  Jeffery Horne is a 87 y.o. year old male  with recent  mrsa infection in lungs, debility, immobility, DM. He is at [facility] for rehab following 2 weeks in hospital for pneumonia and MRSA .   History obtained from review of EMR, discussion with primary team, and interview with family, facility staff/caregiver and/or Mr. Goetzke.  I reviewed available labs, medications, imaging, studies and related documents from the EMR.  Records reviewed and summarized above.   ROS   General: NAD ENMT: endorses HOH, endorses  dysphagia Cardiovascular: denies chest pain, denies DOE Pulmonary: denies cough, denies increased SOB Abdomen: endorses poor appetite, denies constipation, endorses occ continence of bowel GU: denies dysuria, endorses  occ continence of urine MSK:  endorses weakness,  no falls reported Skin: denies rashes or wounds Neurological: denies pain, denies insomnia, endorses confusion Psych: Endorses flat  mood Heme/lymph/immuno: denies bruises, abnormal bleeding  Physical Exam: Current and past weights: 150 lbs Constitutional: NAD General: frail appearing, thin EYES: anicteric sclera, lids intact, no discharge  ENMT: hard of  hearing Pulmonary:  no increased work of breathing, no cough, room air Abdomen: intake 25%, no ascites GU: deferred MSK: severe  sarcopenia, moves all extremities, non ambulatory Skin: warm and dry, no rashes or wounds on visible skin Neuro:  severe  generalized weakness,  severe cognitive impairment Psych: non-anxious affect, A and O x 1 Hem/lymph/immuno: no widespread bruising CURRENT PROBLEM LIST:  Patient Active Problem List   Diagnosis Date Noted   Acute respiratory failure with hypoxemia ([place])    Protein-calorie malnutrition, severe 04/22/2021   Aspiration pneumonia ([place]) 04/13/2021   Sepsis ([place]) 04/13/2021   Generalized weakness 04/13/2021   Protein-calorie malnutrition ([place]) 08/31/2020   Syncope 08/31/2020   Emphysema lung ([place]) 08/26/2020   Aortic atherosclerosis ([place]) 08/26/2020   Cardiac pacemaker 04/23/2020   Carotid artery stenosis, symptomatic, right 03/20/2018   Advanced care planning/counseling discussion 10/01/2017   Skin lesion of face 05/16/2016   Sick sinus syndrome ([place]) 07/28/2015   BPH with obstruction/lower urinary tract symptoms 07/28/2015   Hearing impairment 07/28/2015   Gait abnormality 05/25/2015   Arthritis of ankle, left 05/11/2015   Chronic hoarseness 05/11/2015   Hypertension associated with diabetes ([place])    Type II  diabetes mellitus with neurological manifestations ([place])    Hyperlipidemia associated with type 2 diabetes mellitus ([place])    PAST MEDICAL HISTORY:  Active Ambulatory Problems    Diagnosis Date Noted   Hypertension associated with diabetes ([place])    Type II diabetes mellitus with neurological manifestations ([place])    Hyperlipidemia associated with type 2 diabetes mellitus ([place])    Arthritis of ankle, left 05/11/2015   Chronic hoarseness 05/11/2015   Gait abnormality 05/25/2015   Sick sinus syndrome ([place]) 07/28/2015   BPH with obstruction/lower urinary tract symptoms 07/28/2015   Hearing impairment 07/28/2015   Skin lesion of face 05/16/2016   Advanced care planning/counseling discussion 10/01/2017   Carotid artery stenosis, symptomatic, right 03/20/2018   Cardiac pacemaker 04/23/2020   Emphysema lung ([place]) 08/26/2020   Aortic atherosclerosis ([place]) 08/26/2020   Protein-calorie malnutrition ([place]) 08/31/2020   Syncope 08/31/2020   Aspiration pneumonia ([place]) 04/13/2021   Sepsis ([place]) 04/13/2021   Generalized weakness 04/13/2021   Protein-calorie malnutrition, severe 04/22/2021   Acute respiratory failure with hypoxemia [facility])    Resolved Ambulatory Problems    Diagnosis Date Noted   Multiple fractures of ribs of right side 05/25/2015   Fall at home 05/25/2015   Pressure sore on buttocks 10/01/2017   Amaurosis fugax of right eye 03/20/2018   Hypernatremia 10/14/2019  Past Medical History:  Diagnosis Date   BPH (benign prostatic hypertrophy) with urinary obstruction    Hyperlipidemia    Hypertension    Peripheral vascular disease ([place])    Primary gout    SOCIAL HX:  Social History   Tobacco Use   Smoking status: Former    Types: Cigarettes    Quit date: 09/11/1971    Years since quitting: 49.6   Smokeless tobacco: Never  Substance Use Topics   Alcohol use: No   FAMILY HX:  Family History  Problem Relation Age of Onset   Diabetes Mother    Hypertension Mother     Stroke Mother    Hypertension Father       ALLERGIES: No Known Allergies   PERTINENT MEDICATIONS:  Outpatient Encounter Medications as of 04/27/2021  Medication Sig   acetaminophen (TYLENOL) 325 MG tablet Take 2 tablets (650 mg total) by mouth every 6 (six) hours as needed for mild pain (or Fever >/= 101).   acidophilus (RISAQUAD) CAPS capsule Take 1 capsule by mouth daily.   amoxicillin-clavulanate (AUGMENTIN) 875-125 MG tablet Take 1 tablet by mouth every 12 (twelve) hours for 21 days.   aspirin EC 81 MG tablet Take 81 mg by mouth daily.   clopidogrel (PLAVIX) 75 MG tablet Take 75 mg by mouth daily.   clotrimazole-betamethasone (LOTRISONE) cream Apply 1 application topically 2 (two) times daily.   doxycycline (VIBRA-TABS) 100 MG tablet Take 1 tablet (100 mg total) by mouth 2 (two) times daily for 21 days.   Emollient (EUCERIN INTENSIVE REPAIR) LOTN Apply 1 application topically at bedtime.   fexofenadine (ALLEGRA) 180 MG tablet Take 180 mg by mouth daily.   gabapentin (NEURONTIN) 300 MG capsule Take 2 capsules (600 mg total) by mouth 2 (two) times daily.   glucose blood test strip For testing blood sugar twice per day.   hydrocortisone 1 % ointment Apply 1 application topically at bedtime.   liver oil-zinc oxide (DESITIN) 40 % ointment Apply topically 2 (two) times daily.   loperamide (IMODIUM) 2 MG capsule Take 1 capsule (2 mg total) by mouth as needed for diarrhea or loose stools.   metoprolol succinate (TOPROL-XL) 25 MG 24 hr tablet Take 25 mg by mouth daily.   mirtazapine (REMERON) 7.5 MG tablet Take 1 tablet (7.5 mg total) by mouth at bedtime.   Multiple Vitamin (MULTIVITAMIN WITH MINERALS) TABS tablet Take 1 tablet by mouth daily.   mupirocin ointment (BACTROBAN) 2 % Place 1 application into the nose 2 (two) times daily.   nystatin (MYCOSTATIN/NYSTOP) powder Apply topically 2 (two) times daily.   tamsulosin (FLOMAX) 0.4 MG CAPS capsule TAKE 1 CAPSULE BY MOUTH EVERY DAY   vitamin  B-12 (CYANOCOBALAMIN) 1000 MCG tablet Take 1,000 mcg by mouth daily.   No facility-administered encounter medications on file as of 04/27/2021.   Thank you for the opportunity to participate in the care of Mr. Maselli.  The palliative care team will continue to follow. Please call our office at [phone] if we can be of additional assistance.   Jason Coop, NP , DNP, [facility]  COVID-19 PATIENT SCREENING TOOL Asked and negative response unless otherwise noted:  Have you had symptoms of covid, tested positive or been in contact with someone with symptoms/positive test in the past 5-10 days?

## 2021-05-01 ENCOUNTER — Telehealth: Payer: Self-pay

## 2021-05-01 NOTE — Telephone Encounter (Signed)
Copied from CRM [id number]. Topic: General - Other >> May 01, 2021  9:47 AM Leafy Ro wrote: Reason for CRM: Pt daughter carolyn is calling and would like jolene to return her call concerning her dad . Pt appt was cancel due in rehab

## 2021-05-01 NOTE — Telephone Encounter (Signed)
Spoke to [place] on telephone about patient -- she discussed his recent hospitalization and weakness.  Living at [facility].  Was released to rehab and to do 4 more weeks of abx.  They are doing testing on swallowing currently and she reports this has set him back.  Is working with physical therapy.  Reports wishes to keep PCP updated.

## 2021-05-02 ENCOUNTER — Ambulatory Visit: Payer: Medicare Other | Admitting: Nurse Practitioner

## 2021-05-08 ENCOUNTER — Non-Acute Institutional Stay: Payer: Medicare Other | Admitting: Primary Care

## 2021-05-08 ENCOUNTER — Other Ambulatory Visit: Payer: Self-pay

## 2021-05-08 DIAGNOSIS — Z515 Encounter for palliative care: Secondary | ICD-10-CM

## 2021-05-08 DIAGNOSIS — E43 Unspecified severe protein-calorie malnutrition: Secondary | ICD-10-CM

## 2021-05-08 DIAGNOSIS — J9601 Acute respiratory failure with hypoxia: Secondary | ICD-10-CM

## 2021-05-08 NOTE — Progress Notes (Signed)
[facility] Pall[facility] Consult Note Telephone: [phone]  Fax: [phone]    Date of encounter: 05/08/21 12:38 PM PATIENT NAME: Jeffery Horne [place] [id number]   [phone] (home)  DOB: 09-May-1927 MRN: [id number] PRIMARY CARE PROVIDER:    Venita Lick, NP,  [place] [address] [phone]  REFERRING PROVIDER:   Dr Merlene Pulling [facility]  RESPONSIBLE PARTY:    Contact Information     Name Relation Home Work Mobile   Jeffery Horne,Jeffery Horne Daughter [phone]  [phone]   Jeffery Horne, Jeffery Horne Daughter [phone]  [phone]       I met face to face with patient and family in [facility]  facility. Palliative Care was asked to follow this patient by consultation request of  Venita Lick, NP to address advance care planning and complex medical decision making. This is a follow up visit.                                   ASSESSMENT AND PLAN / RECOMMENDATIONS:   Advance Care Planning/Goals of Care: Goals include to maximize quality of life and symptom management. Our advance care planning conversation included a discussion about:    The value and importance of advance care planning  Experiences with loved ones who have been seriously ill or have died  Exploration of personal, cultural or spiritual beliefs that might influence medical decisions  Exploration of goals of care in the event of a sudden injury or illness  Review of an  advance directive document . Decision to de-escalate disease focused treatments due to poor prognosis. CODE STATUS: DNR Discussed patient decline and EoL status at this time with daughter and POA Jeffery Horne. She is willing and ready for patient to receive hospice services, not wanting her father to linger or suffer. Discussed admission with SNF staff who will address with family.  I spent 25 minutes providing this consultation. More than 50% of the time in this  consultation was spent in counseling and care coordination.  ---------------------------------------------------------------------------------------------------------------------------------------------------------------------------------  Symptom Management/Plan: Orders Given   Dyspnea: Has worsened, pt has agitation and pulse ox 74% on 2.5 L. Ordered 4 L oxygen to SNF staff. Education provided family RE PNA and dyspnea.  Ordered Morphine 2.5 mg po q 6 hrs and qid prn dyspnea and pain. Also has terminal secretions,  ordered Levsin 0.125 mg po q 4 hrs.  Agitation: Pt batting away during exam has been agitate with family and staff. Ativan 0.25 mg ordered q 6 hrs prn. Hospice will increase as needed.   Orders given:  Morphine 2.5 mg po every 6 hrs and qid prn dypsnea Ativan 0.25 mg po every 6 hrs as needed for agitation Levsin 0.125 mg po q 4 hrs.  Nutrition; Pt is occ aspirating on food/ liquid. I recommend a change diet to pleasure diet: Pudding, apple sauce, ice cream, drinks or NPO if staff feel he cannot swallow.  Follow up Palliative Care Visit: Admit to hospice asap per family request .  This visit was coded based on medical decision making (MDM)  PPS: 20%  HOSPICE ELIGIBILITY/DIAGNOSIS: TBD  Chief Complaint: dyspnea  HISTORY OF PRESENT ILLNESS:  Jeffery Horne is a 87 y.o. year old male  with pna from aspiration, MRSA infection Dyspnea increased in context of pneumonia, pulse ox sat now 74% on 2.5 liters; increased to 4 l  for comfort measures. Ordered morphine for air hunger as well, has had some relief with tramadol. Marland Kitchen   History obtained from review of EMR, discussion with primary team, and interview with family, facility staff/caregiver and/or Jeffery Horne.  I reviewed available labs, medications, imaging, studies and related documents from the EMR.  Records reviewed and summarized above.   ROS/staff   General: ill appearing ENMT: endorses dysphagia Pulmonary: endorses  cough, endorses increased SOB Abdomen: endorses good appetite, denies constipation, endorses incontinence of bowel MSK:  endorses weakness,  no falls reported Skin: endorses wounds Neurological:  endorses PAINAD pain, denies insomnia Psych: Endorses agitated mood Heme/lymph/immuno: denies bruises, abnormal bleeding  Physical Exam: Current and past weights: 126 lbs Constitutional: ill appearing General: frail appearing, thin ENMT: hard of hearing, oral mucous membranes dry CV: RRR, no LE edema Pulmonary: rhonchi throughout, terminal secretions, + increased work of breathing, productive cough, oxygen 2.5 L = 74% Abdomen: intake <10%, no ascites GU: deferred MSK: ++ sarcopenia, moves all extremities, non ambulatory Skin: warm and dry, L foot dorsal wound, 1 cm round Neuro:  ++ generalized weakness,  ++cognitive impairment Psych: anxious affect, agitated at times Hem/lymph/immuno: no widespread bruising   Thank you for the opportunity to participate in the care of Jeffery Horne.  The palliative care team will continue to follow. Please call our office at [phone] if we can be of additional assistance.   Jason Coop, NP   COVID-19 PATIENT SCREENING TOOL Asked and negative response unless otherwise noted:   Have you had symptoms of covid, tested positive or been in contact with someone with symptoms/positive test in the past 5-10 days?

## 2021-05-24 ENCOUNTER — Telehealth: Payer: Self-pay

## 2021-05-24 NOTE — Telephone Encounter (Signed)
Patient is due for Annual Wellness Visit per Medicare. He is currently in rehab and palliative care.

## 2021-06-10 DEATH — deceased

## 2023-06-22 IMAGING — CT CT CHEST W/O CM
2 of 3 series · 15 of 36 positions shown, 18 images · non-contrast
Comparison: Radiograph same day.

CLINICAL DATA: Fever, abnormal chest x-ray.

EXAM:
CT CHEST WITHOUT CONTRAST
TECHNIQUE: Multidetector CT imaging of the chest was performed following the
standard protocol without IV contrast.

[Series 2: thorax · axial · 0.78mm/px · z∈[-541,-241]mm · 12 of 176 slices shown, 15 images]
[im 13/176  mediastinal]
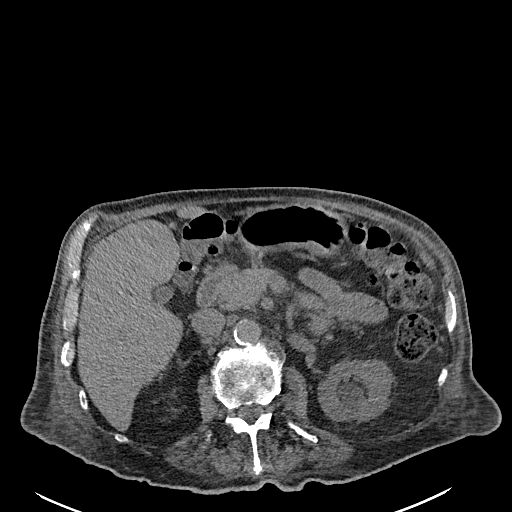
[im 13/176  lung]
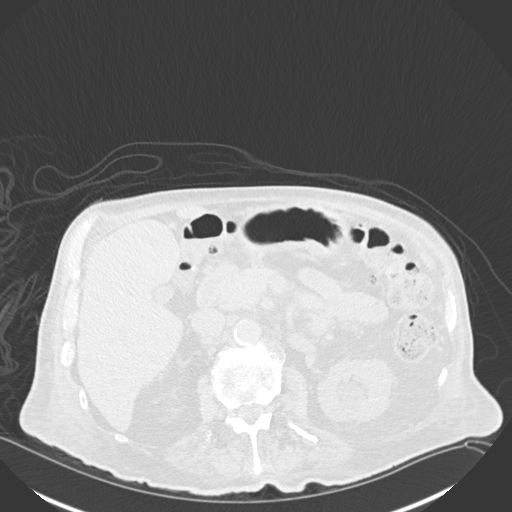
[im 26/176  lung]
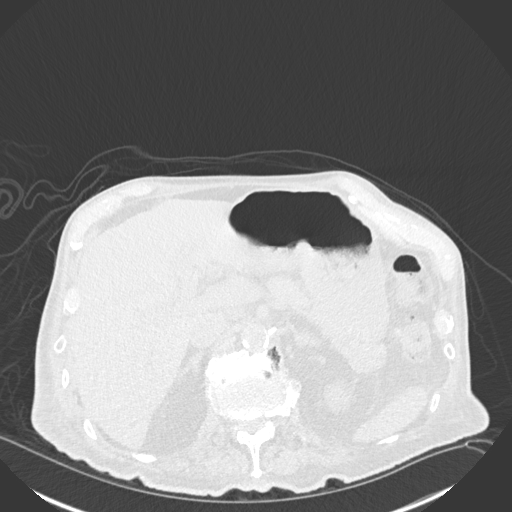
[im 39/176  lung]
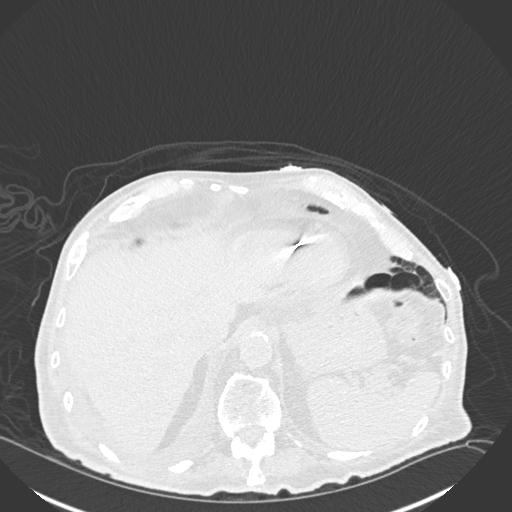
[im 52/176  lung]
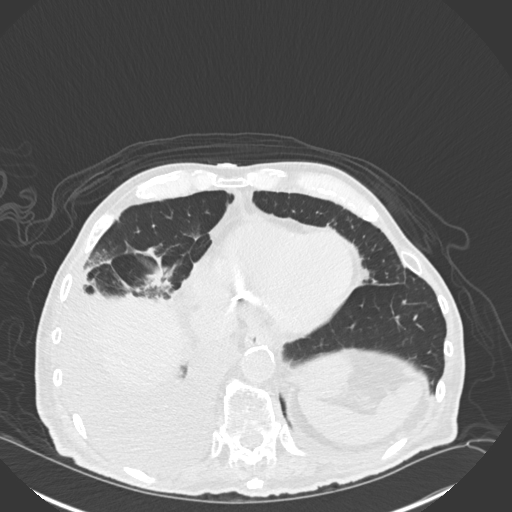
[im 65/176  mediastinal]
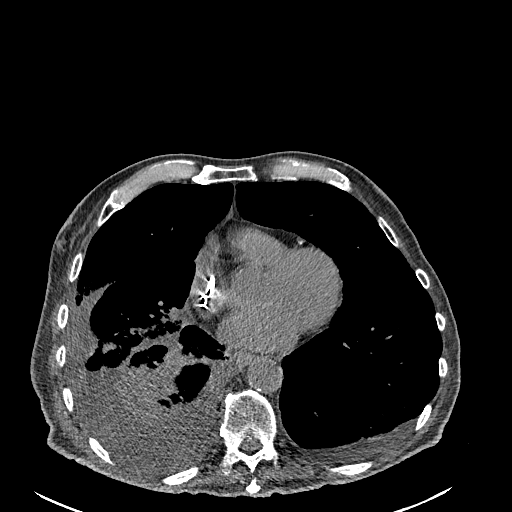
[im 65/176  lung]
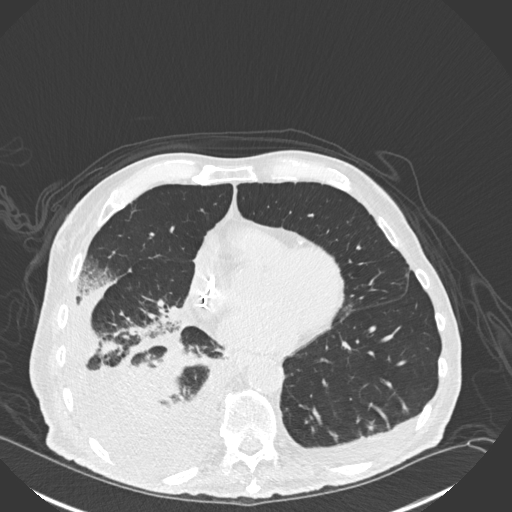
[im 78/176  lung]
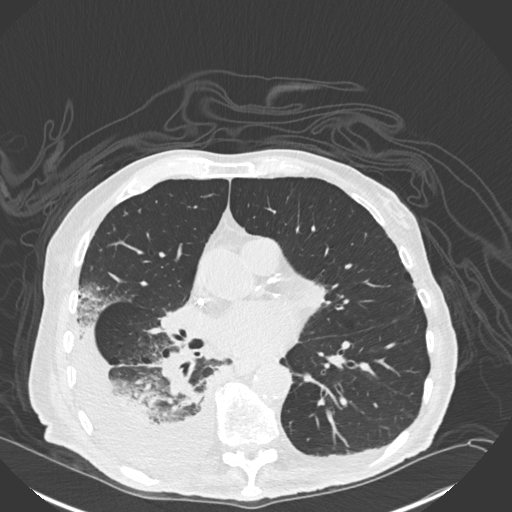
[im 98/176  lung]
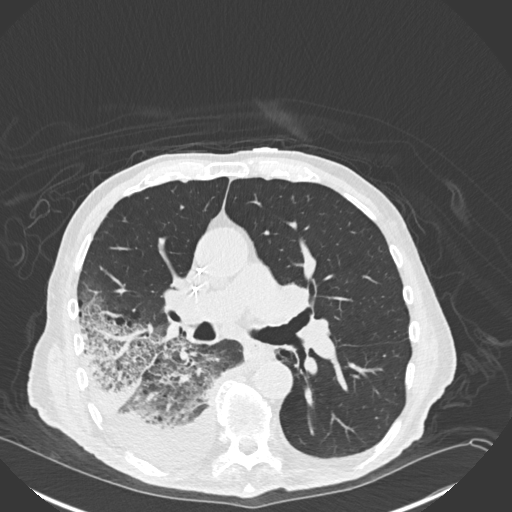
[im 111/176  lung]
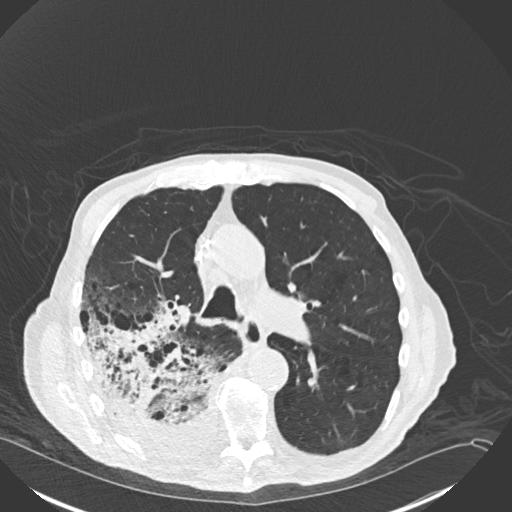
[im 124/176  mediastinal]
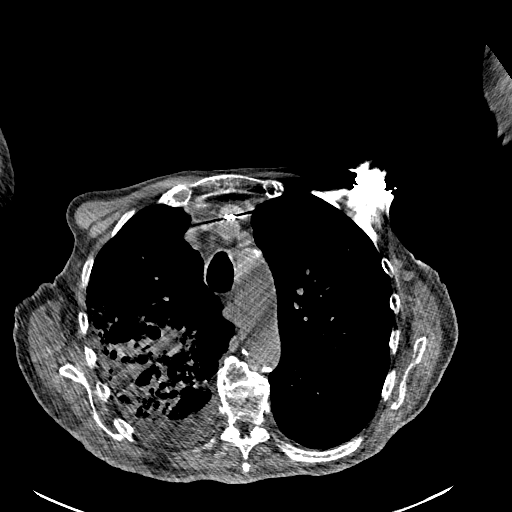
[im 124/176  lung]
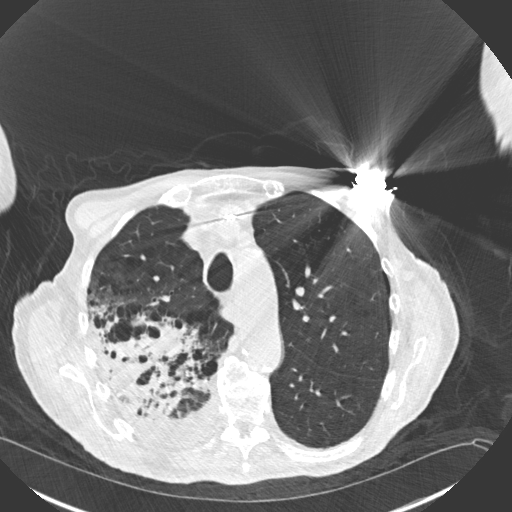
[im 137/176  lung]
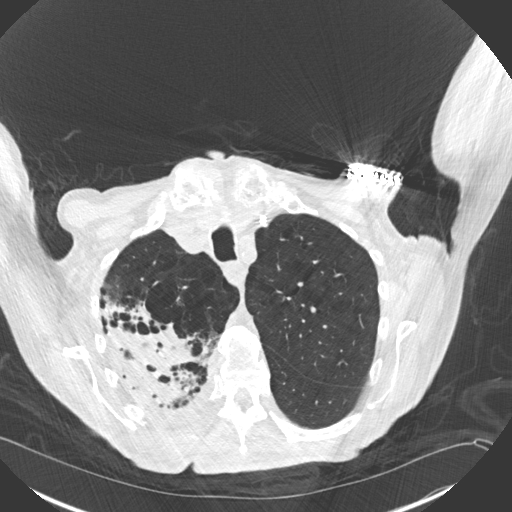
[im 150/176  lung]
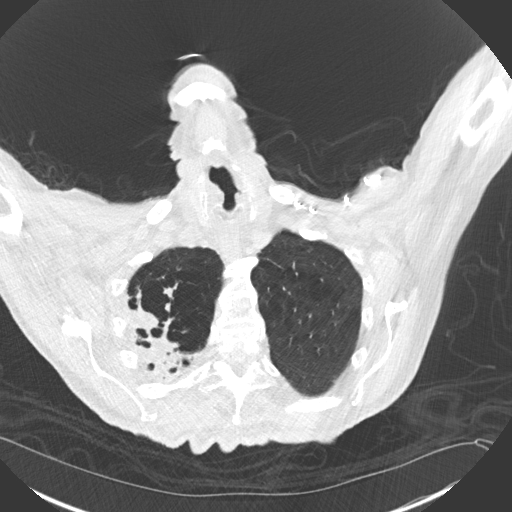
[im 163/176  lung]
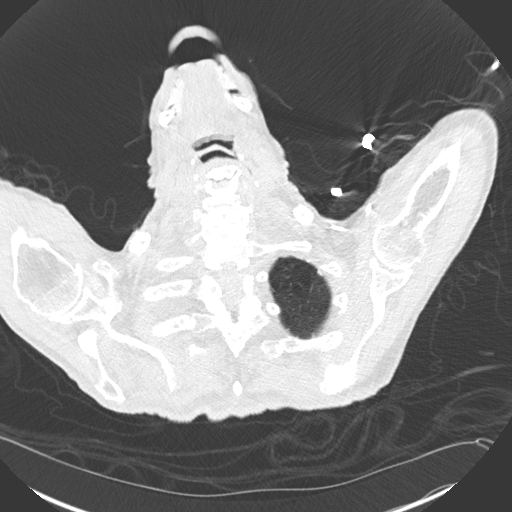

[Series 5: coronal · coronal · 0.75mm/px · 3 of 149 slices shown]
[im 30/149  lung]
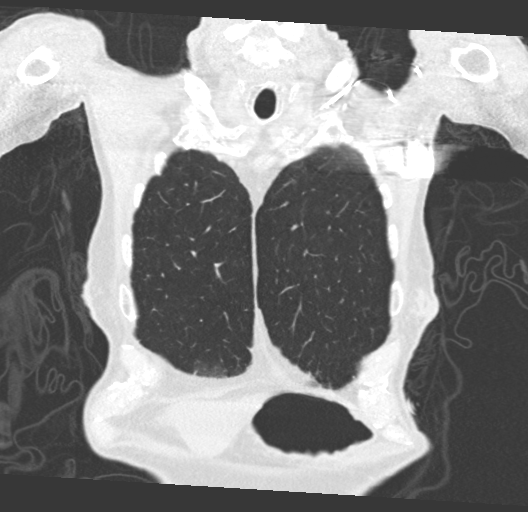
[im 60/149  lung]
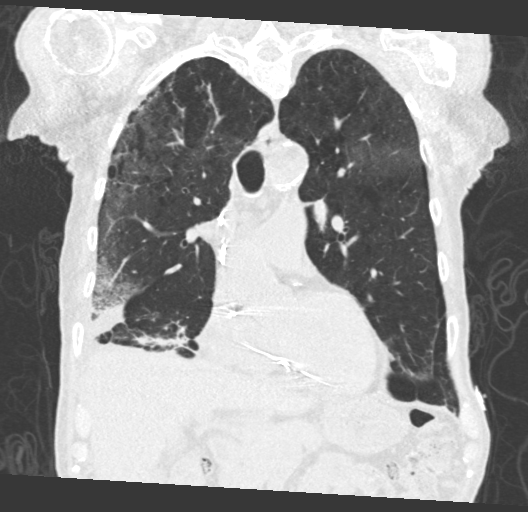
[im 89/149  lung]
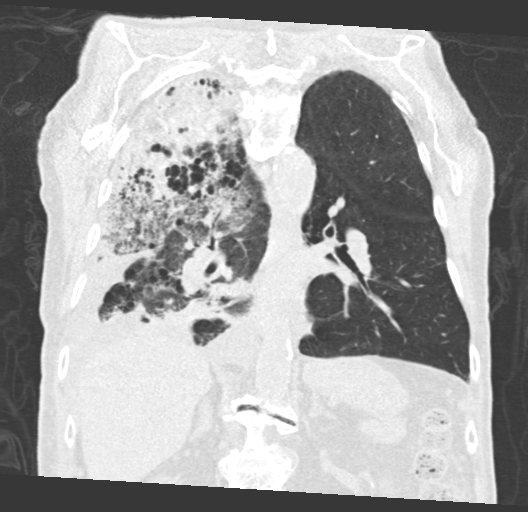

[15 of 36 positions shown; findings below may reference images not displayed]

FINDINGS: Cardiovascular: Atherosclerosis of thoracic aorta is noted without
aneurysm formation. Left-sided pacemaker is in grossly good
position. Normal cardiac size. No pericardial effusion.

Mediastinum/Nodes: No enlarged mediastinal or axillary lymph nodes.
Thyroid gland, trachea, and esophagus demonstrate no significant
findings.

Lungs/Pleura: No pneumothorax is noted. Small right pleural effusion
is noted. Airspace opacity is noted posteriorly in the right upper
and lower lobes concerning for pneumonia. Some cystic formation is
seen in the inflamed portion of lung which may represent some degree
of necrosis or possibly emphysema. Minimal left posterior basilar
subsegmental atelectasis is noted.

Upper Abdomen: Cholelithiasis.

Musculoskeletal: No chest wall mass or suspicious bone lesions
identified.
IMPRESSION: Large airspace opacity is noted posteriorly in the right upper and
lower lobes concerning for pneumonia. Multiple cysts are seen in the
inflamed portion of the right lung which may represent necrosis or
possibly underlying emphysema. Small right pleural effusion is
noted.

Minimal left posterior basilar subsegmental atelectasis is noted.

Cholelithiasis.

Aortic Atherosclerosis (4ABYR-WTU.U) and Emphysema (4ABYR-JD2.Q).

## 2023-06-22 IMAGING — CR DG CHEST 2V
1 series · 3 of 3 positions shown · non-contrast
Comparison: Chest x-ray 04/12/2021.

CLINICAL DATA: [AGE] male with history of fever.

EXAM:
CHEST - 2 VIEW

[Series 1: dg chest 2 view · 0.14mm/px · 3 of 3 slices shown]
[im 1/3]
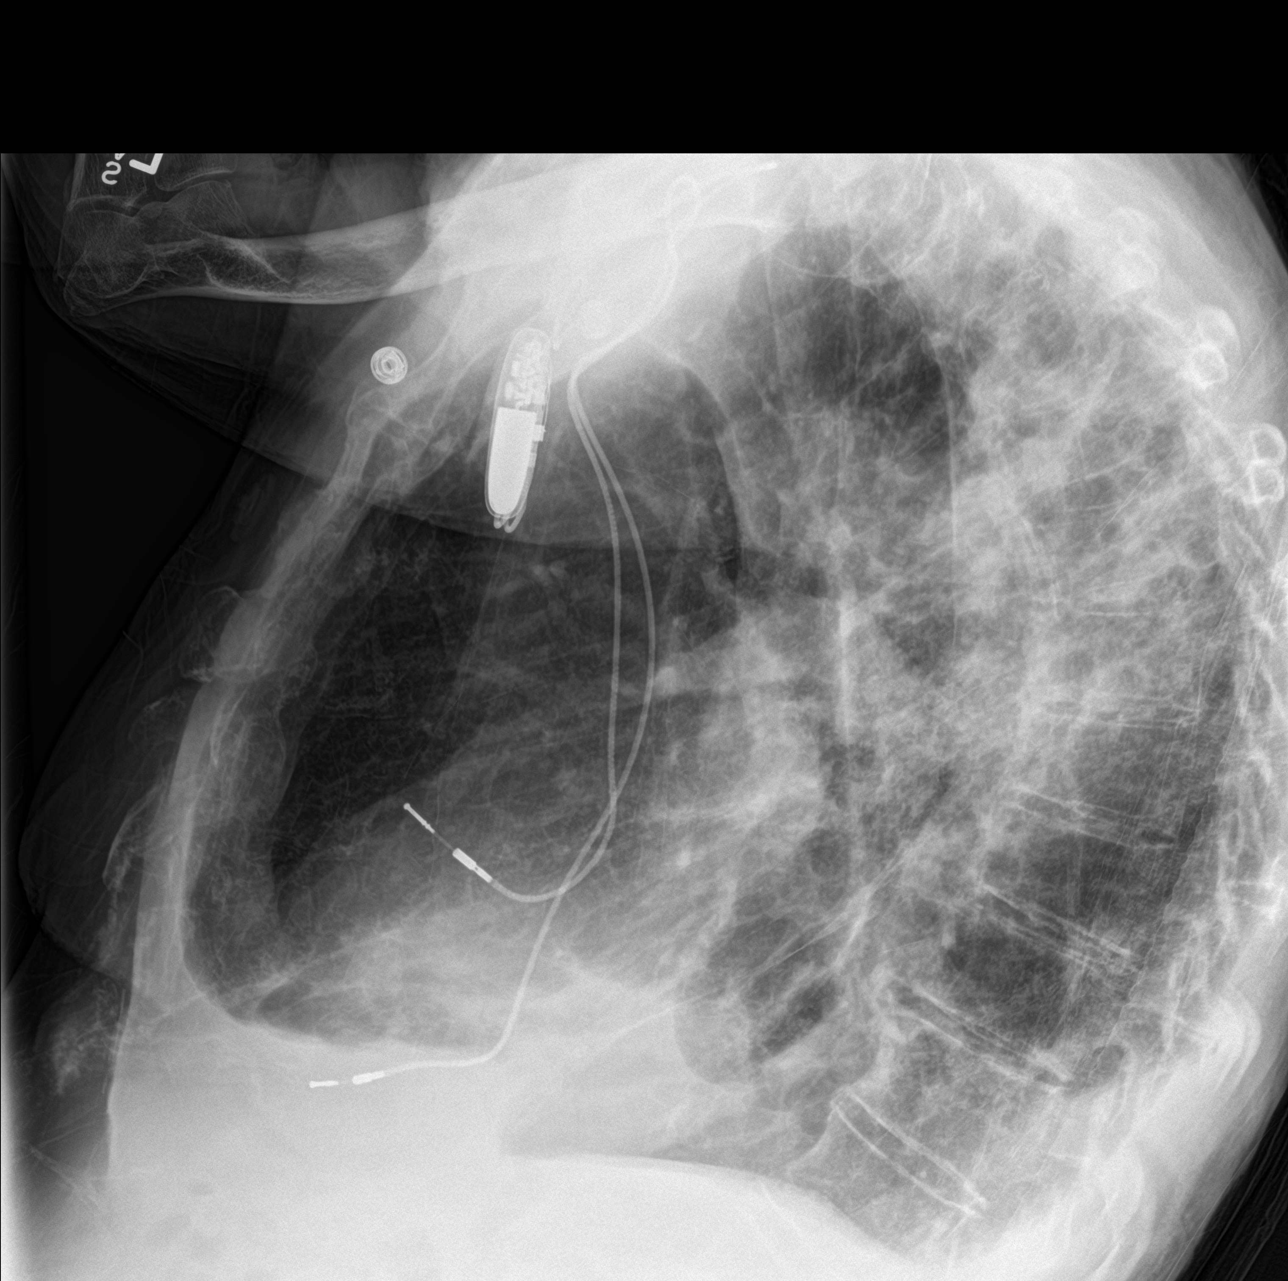
[im 2/3]
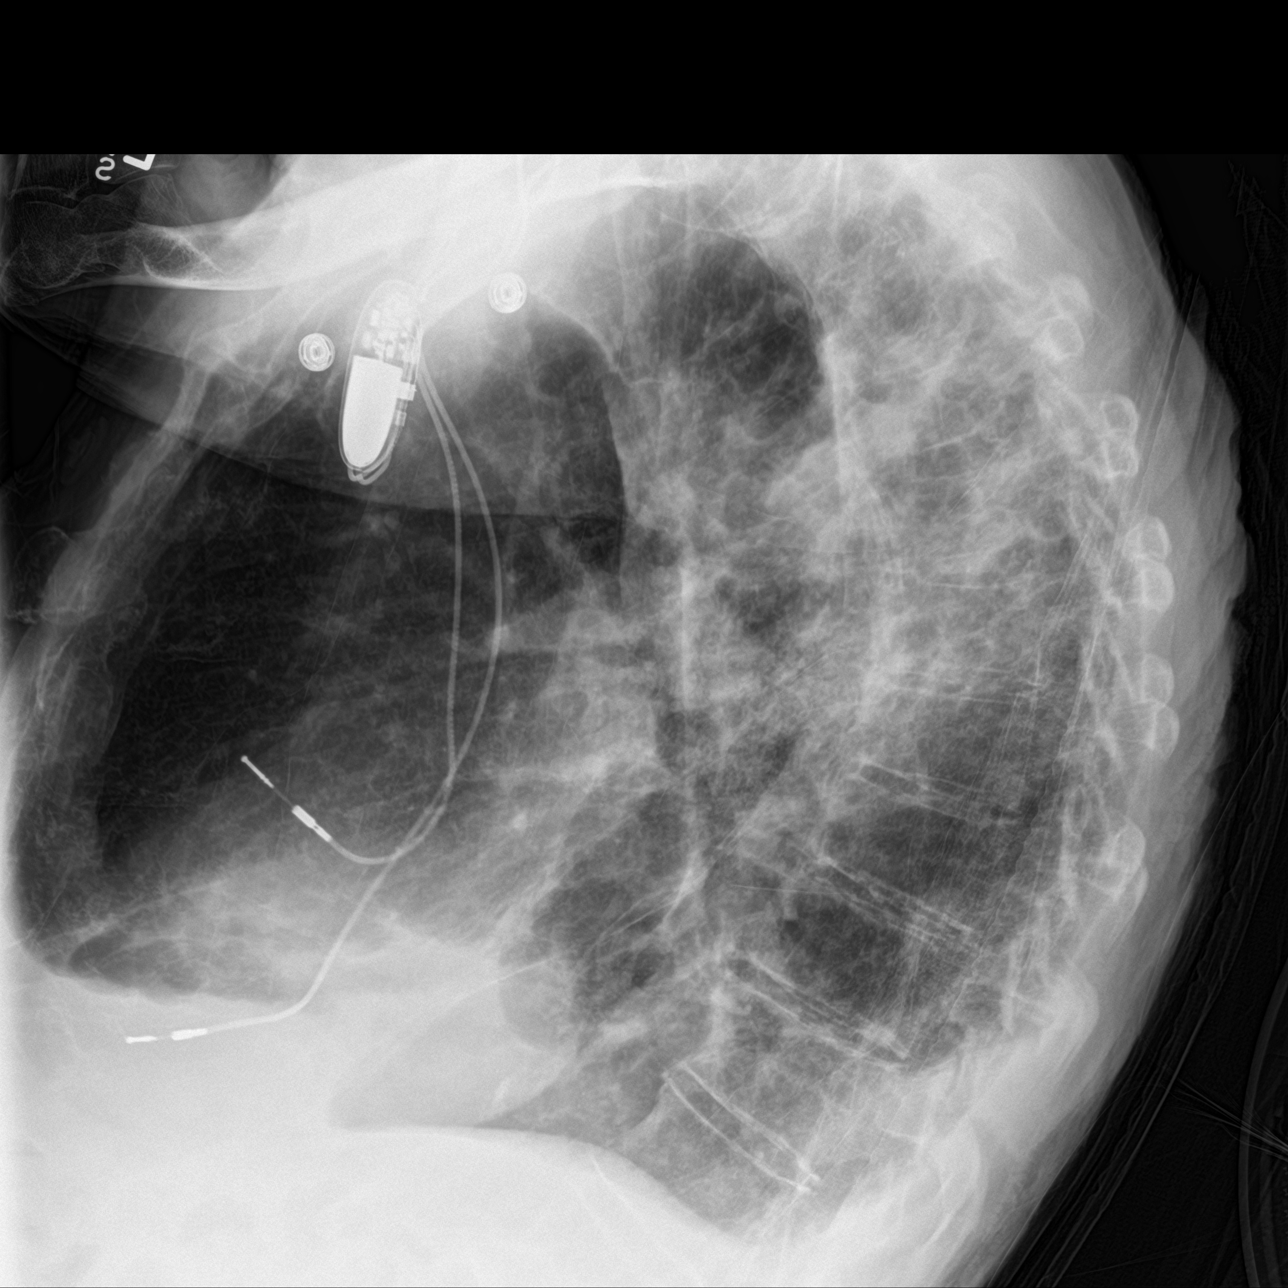
[im 3/3]
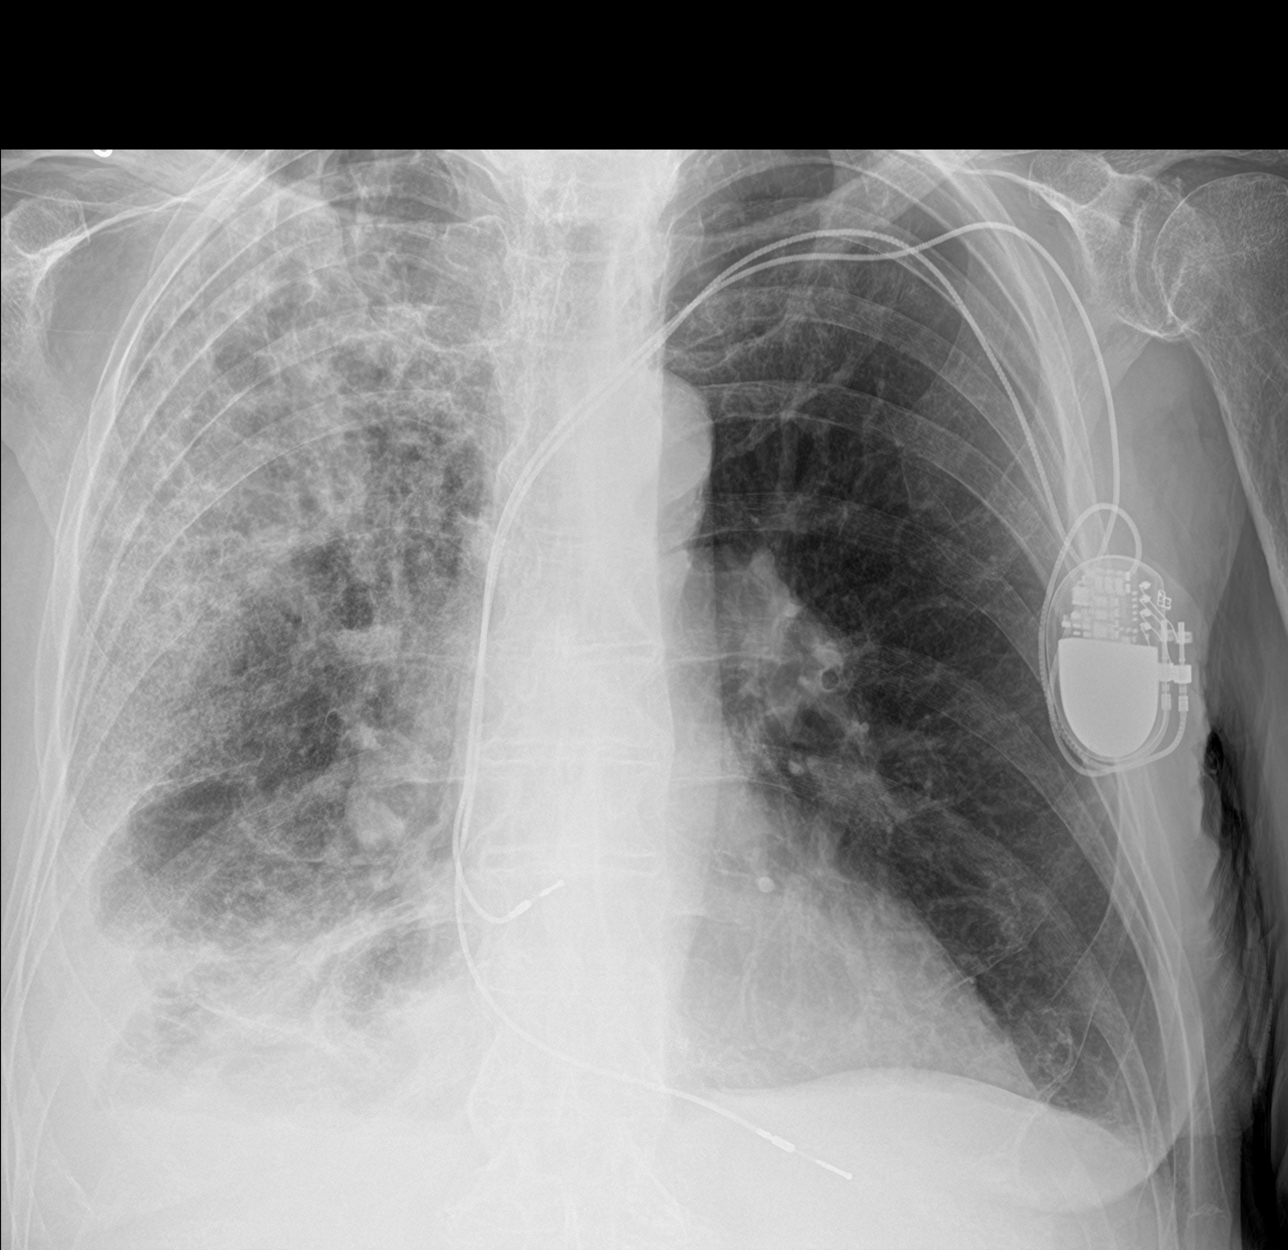

[3 of 3 positions shown; findings below may reference images not displayed]

FINDINGS: Patchy multifocal airspace consolidation and interstitial prominence
noted throughout the right lung. Small right pleural effusion which
may be partially loculated in the lower lateral right hemithorax.
Left lung appears clear. No left pleural effusion. Emphysematous
changes are noted throughout the lungs. No pneumothorax. No evidence
of pulmonary edema. Heart size is normal. Upper mediastinal contours
are within normal limits. Atherosclerosis in the thoracic aorta.
Left-sided pacemaker device in place with lead tips projecting over
the expected location of the right atrium and right ventricle.
IMPRESSION: 1. Worsening multilobar right-sided pneumonia with small right-sided
parapneumonic pleural effusion which may be partially loculated
laterally.
2. Emphysema.
3. Aortic atherosclerosis.
